# Patient Record
Sex: Male | Born: 1965 | Race: White | Hispanic: No | Marital: Single | Smoking: Never smoker
Health system: Southern US, Community
[De-identification: ages and names within clinical notes are randomized; demographics above are authoritative.]

## PROBLEM LIST (undated history)

## (undated) DIAGNOSIS — I10 Essential (primary) hypertension: Secondary | ICD-10-CM

## (undated) DIAGNOSIS — F32A Depression, unspecified: Secondary | ICD-10-CM

## (undated) DIAGNOSIS — J45990 Exercise induced bronchospasm: Secondary | ICD-10-CM

## (undated) DIAGNOSIS — Z9889 Other specified postprocedural states: Secondary | ICD-10-CM

## (undated) DIAGNOSIS — M51369 Other intervertebral disc degeneration, lumbar region without mention of lumbar back pain or lower extremity pain: Secondary | ICD-10-CM

## (undated) DIAGNOSIS — J189 Pneumonia, unspecified organism: Secondary | ICD-10-CM

## (undated) DIAGNOSIS — Z8709 Personal history of other diseases of the respiratory system: Secondary | ICD-10-CM

## (undated) DIAGNOSIS — F329 Major depressive disorder, single episode, unspecified: Secondary | ICD-10-CM

## (undated) DIAGNOSIS — K219 Gastro-esophageal reflux disease without esophagitis: Secondary | ICD-10-CM

## (undated) DIAGNOSIS — K589 Irritable bowel syndrome without diarrhea: Secondary | ICD-10-CM

## (undated) DIAGNOSIS — R112 Nausea with vomiting, unspecified: Secondary | ICD-10-CM

## (undated) DIAGNOSIS — H9312 Tinnitus, left ear: Secondary | ICD-10-CM

## (undated) DIAGNOSIS — Z9109 Other allergy status, other than to drugs and biological substances: Secondary | ICD-10-CM

## (undated) DIAGNOSIS — D649 Anemia, unspecified: Secondary | ICD-10-CM

## (undated) DIAGNOSIS — G4730 Sleep apnea, unspecified: Secondary | ICD-10-CM

## (undated) DIAGNOSIS — M5136 Other intervertebral disc degeneration, lumbar region: Secondary | ICD-10-CM

## (undated) DIAGNOSIS — K5190 Ulcerative colitis, unspecified, without complications: Secondary | ICD-10-CM

## (undated) HISTORY — PX: PYLOROPLASTY: SHX418

## (undated) HISTORY — PX: OTHER SURGICAL HISTORY: SHX169

## (undated) HISTORY — DX: Sleep apnea, unspecified: G47.30

## (undated) HISTORY — DX: Ulcerative colitis, unspecified, without complications: K51.90

## (undated) HISTORY — PX: BACK SURGERY: SHX140

## (undated) HISTORY — PX: SEPTOPLASTY: SUR1290

## (undated) HISTORY — PX: TONSILLECTOMY AND ADENOIDECTOMY: SUR1326

---

## 1998-03-31 ENCOUNTER — Encounter: Admission: RE | Admit: 1998-03-31 | Discharge: 1998-03-31 | Payer: Self-pay | Admitting: Family Medicine

## 1998-04-04 ENCOUNTER — Encounter: Admission: RE | Admit: 1998-04-04 | Discharge: 1998-04-04 | Payer: Self-pay | Admitting: Sports Medicine

## 1998-06-20 ENCOUNTER — Encounter: Admission: RE | Admit: 1998-06-20 | Discharge: 1998-06-20 | Payer: Self-pay | Admitting: Family Medicine

## 1999-12-12 ENCOUNTER — Encounter: Admission: RE | Admit: 1999-12-12 | Discharge: 1999-12-12 | Payer: Self-pay | Admitting: Family Medicine

## 2001-04-07 ENCOUNTER — Encounter: Admission: RE | Admit: 2001-04-07 | Discharge: 2001-04-07 | Payer: Self-pay | Admitting: Family Medicine

## 2001-04-21 ENCOUNTER — Encounter: Admission: RE | Admit: 2001-04-21 | Discharge: 2001-04-21 | Payer: Self-pay | Admitting: Family Medicine

## 2001-11-17 ENCOUNTER — Encounter: Admission: RE | Admit: 2001-11-17 | Discharge: 2001-11-17 | Payer: Self-pay | Admitting: Family Medicine

## 2002-03-08 ENCOUNTER — Encounter: Admission: RE | Admit: 2002-03-08 | Discharge: 2002-03-08 | Payer: Self-pay | Admitting: Family Medicine

## 2002-06-01 ENCOUNTER — Encounter: Admission: RE | Admit: 2002-06-01 | Discharge: 2002-06-01 | Payer: Self-pay | Admitting: Family Medicine

## 2002-06-22 ENCOUNTER — Encounter: Admission: RE | Admit: 2002-06-22 | Discharge: 2002-06-22 | Payer: Self-pay | Admitting: Family Medicine

## 2002-08-23 ENCOUNTER — Encounter (INDEPENDENT_AMBULATORY_CARE_PROVIDER_SITE_OTHER): Payer: Self-pay

## 2002-08-23 ENCOUNTER — Ambulatory Visit (HOSPITAL_COMMUNITY): Admission: RE | Admit: 2002-08-23 | Discharge: 2002-08-23 | Payer: Self-pay | Admitting: Gastroenterology

## 2003-03-08 ENCOUNTER — Encounter: Admission: RE | Admit: 2003-03-08 | Discharge: 2003-03-08 | Payer: Self-pay | Admitting: Family Medicine

## 2003-03-08 ENCOUNTER — Encounter: Payer: Self-pay | Admitting: Internal Medicine

## 2003-03-08 ENCOUNTER — Ambulatory Visit (HOSPITAL_COMMUNITY): Admission: RE | Admit: 2003-03-08 | Discharge: 2003-03-08 | Payer: Self-pay | Admitting: Internal Medicine

## 2003-04-01 ENCOUNTER — Ambulatory Visit (HOSPITAL_COMMUNITY): Admission: RE | Admit: 2003-04-01 | Discharge: 2003-04-01 | Payer: Self-pay | Admitting: Sports Medicine

## 2003-06-06 ENCOUNTER — Encounter: Admission: RE | Admit: 2003-06-06 | Discharge: 2003-06-06 | Payer: Self-pay | Admitting: Family Medicine

## 2004-01-04 ENCOUNTER — Encounter: Admission: RE | Admit: 2004-01-04 | Discharge: 2004-01-04 | Payer: Self-pay | Admitting: Family Medicine

## 2004-10-30 ENCOUNTER — Ambulatory Visit: Payer: Self-pay | Admitting: Family Medicine

## 2005-03-08 ENCOUNTER — Ambulatory Visit: Payer: Self-pay | Admitting: Sports Medicine

## 2005-04-05 ENCOUNTER — Ambulatory Visit: Payer: Self-pay | Admitting: Sports Medicine

## 2005-04-11 ENCOUNTER — Ambulatory Visit (HOSPITAL_COMMUNITY): Admission: RE | Admit: 2005-04-11 | Discharge: 2005-04-11 | Payer: Self-pay | Admitting: Sports Medicine

## 2005-04-16 ENCOUNTER — Encounter: Admission: RE | Admit: 2005-04-16 | Discharge: 2005-07-15 | Payer: Self-pay | Admitting: Sports Medicine

## 2005-08-12 HISTORY — PX: COLONOSCOPY: SHX5424

## 2006-05-09 ENCOUNTER — Ambulatory Visit: Payer: Self-pay | Admitting: Family Medicine

## 2006-05-20 ENCOUNTER — Ambulatory Visit: Payer: Self-pay | Admitting: Family Medicine

## 2006-10-09 DIAGNOSIS — J309 Allergic rhinitis, unspecified: Secondary | ICD-10-CM | POA: Insufficient documentation

## 2006-10-09 DIAGNOSIS — K589 Irritable bowel syndrome without diarrhea: Secondary | ICD-10-CM | POA: Insufficient documentation

## 2006-10-09 DIAGNOSIS — E739 Lactose intolerance, unspecified: Secondary | ICD-10-CM | POA: Insufficient documentation

## 2006-10-09 DIAGNOSIS — F329 Major depressive disorder, single episode, unspecified: Secondary | ICD-10-CM

## 2006-10-09 DIAGNOSIS — K219 Gastro-esophageal reflux disease without esophagitis: Secondary | ICD-10-CM | POA: Insufficient documentation

## 2006-10-09 DIAGNOSIS — F32A Depression, unspecified: Secondary | ICD-10-CM | POA: Insufficient documentation

## 2006-10-09 DIAGNOSIS — E669 Obesity, unspecified: Secondary | ICD-10-CM | POA: Insufficient documentation

## 2006-10-09 DIAGNOSIS — J45909 Unspecified asthma, uncomplicated: Secondary | ICD-10-CM | POA: Insufficient documentation

## 2006-10-12 ENCOUNTER — Emergency Department (HOSPITAL_COMMUNITY): Admission: EM | Admit: 2006-10-12 | Discharge: 2006-10-12 | Payer: Self-pay | Admitting: Family Medicine

## 2006-12-05 ENCOUNTER — Telehealth: Payer: Self-pay | Admitting: Family Medicine

## 2007-02-11 ENCOUNTER — Telehealth: Payer: Self-pay | Admitting: *Deleted

## 2007-02-16 ENCOUNTER — Telehealth: Payer: Self-pay | Admitting: *Deleted

## 2007-02-18 ENCOUNTER — Encounter: Payer: Self-pay | Admitting: Family Medicine

## 2007-02-18 ENCOUNTER — Ambulatory Visit: Payer: Self-pay | Admitting: Family Medicine

## 2007-02-18 LAB — CONVERTED CEMR LAB
Albumin: 4.9 g/dL (ref 3.5–5.2)
Alkaline Phosphatase: 86 units/L (ref 39–117)
Glucose, Bld: 81 mg/dL (ref 70–99)
HCT: 43.6 % (ref 39.0–52.0)
Hemoglobin: 15 g/dL (ref 13.0–17.0)
MCHC: 34.4 g/dL (ref 30.0–36.0)
Platelets: 201 10*3/uL (ref 150–400)
Potassium: 3.9 meq/L (ref 3.5–5.3)
RBC: 5.09 M/uL (ref 4.22–5.81)
RDW: 12.6 % (ref 11.5–14.0)
Sodium: 141 meq/L (ref 135–145)

## 2007-03-17 ENCOUNTER — Ambulatory Visit: Payer: Self-pay | Admitting: Family Medicine

## 2007-03-17 DIAGNOSIS — G4730 Sleep apnea, unspecified: Secondary | ICD-10-CM | POA: Insufficient documentation

## 2007-04-13 DIAGNOSIS — G4730 Sleep apnea, unspecified: Secondary | ICD-10-CM

## 2007-04-13 HISTORY — PX: OTHER SURGICAL HISTORY: SHX169

## 2007-04-13 HISTORY — DX: Sleep apnea, unspecified: G47.30

## 2007-04-15 ENCOUNTER — Encounter: Payer: Self-pay | Admitting: Family Medicine

## 2007-04-15 ENCOUNTER — Ambulatory Visit (HOSPITAL_BASED_OUTPATIENT_CLINIC_OR_DEPARTMENT_OTHER): Admission: RE | Admit: 2007-04-15 | Discharge: 2007-04-15 | Payer: Self-pay | Admitting: Family Medicine

## 2007-04-18 ENCOUNTER — Ambulatory Visit: Payer: Self-pay | Admitting: Internal Medicine

## 2007-05-07 ENCOUNTER — Ambulatory Visit: Payer: Self-pay | Admitting: Sports Medicine

## 2007-05-07 ENCOUNTER — Telehealth (INDEPENDENT_AMBULATORY_CARE_PROVIDER_SITE_OTHER): Payer: Self-pay | Admitting: *Deleted

## 2007-05-20 ENCOUNTER — Encounter: Payer: Self-pay | Admitting: Family Medicine

## 2007-06-02 ENCOUNTER — Ambulatory Visit: Payer: Self-pay | Admitting: Family Medicine

## 2007-06-18 ENCOUNTER — Telehealth: Payer: Self-pay | Admitting: *Deleted

## 2007-06-23 ENCOUNTER — Encounter: Payer: Self-pay | Admitting: *Deleted

## 2007-07-24 ENCOUNTER — Encounter: Payer: Self-pay | Admitting: Family Medicine

## 2007-07-24 ENCOUNTER — Ambulatory Visit (HOSPITAL_BASED_OUTPATIENT_CLINIC_OR_DEPARTMENT_OTHER): Admission: RE | Admit: 2007-07-24 | Discharge: 2007-07-24 | Payer: Self-pay | Admitting: Family Medicine

## 2007-08-02 ENCOUNTER — Ambulatory Visit: Payer: Self-pay | Admitting: Internal Medicine

## 2007-09-02 ENCOUNTER — Telehealth: Payer: Self-pay | Admitting: *Deleted

## 2007-09-29 ENCOUNTER — Ambulatory Visit: Payer: Self-pay | Admitting: Family Medicine

## 2007-09-29 DIAGNOSIS — G2581 Restless legs syndrome: Secondary | ICD-10-CM | POA: Insufficient documentation

## 2007-10-11 HISTORY — PX: ESOPHAGOGASTRODUODENOSCOPY: SHX5428

## 2007-10-26 ENCOUNTER — Telehealth: Payer: Self-pay | Admitting: *Deleted

## 2007-10-27 ENCOUNTER — Telehealth: Payer: Self-pay | Admitting: Family Medicine

## 2007-10-28 ENCOUNTER — Encounter: Payer: Self-pay | Admitting: Family Medicine

## 2007-11-03 ENCOUNTER — Telehealth: Payer: Self-pay | Admitting: Family Medicine

## 2007-11-04 ENCOUNTER — Encounter: Payer: Self-pay | Admitting: Family Medicine

## 2007-11-04 ENCOUNTER — Encounter (INDEPENDENT_AMBULATORY_CARE_PROVIDER_SITE_OTHER): Payer: Self-pay | Admitting: Gastroenterology

## 2007-11-04 ENCOUNTER — Ambulatory Visit (HOSPITAL_COMMUNITY): Admission: RE | Admit: 2007-11-04 | Discharge: 2007-11-04 | Payer: Self-pay | Admitting: Gastroenterology

## 2007-11-05 ENCOUNTER — Encounter: Payer: Self-pay | Admitting: *Deleted

## 2007-11-06 ENCOUNTER — Telehealth (INDEPENDENT_AMBULATORY_CARE_PROVIDER_SITE_OTHER): Payer: Self-pay | Admitting: *Deleted

## 2007-11-09 ENCOUNTER — Telehealth (INDEPENDENT_AMBULATORY_CARE_PROVIDER_SITE_OTHER): Payer: Self-pay | Admitting: *Deleted

## 2007-11-09 ENCOUNTER — Telehealth (INDEPENDENT_AMBULATORY_CARE_PROVIDER_SITE_OTHER): Payer: Self-pay | Admitting: Family Medicine

## 2007-11-10 ENCOUNTER — Encounter: Payer: Self-pay | Admitting: *Deleted

## 2007-11-10 ENCOUNTER — Telehealth: Payer: Self-pay | Admitting: *Deleted

## 2007-12-22 ENCOUNTER — Ambulatory Visit: Payer: Self-pay | Admitting: Family Medicine

## 2007-12-22 ENCOUNTER — Encounter: Payer: Self-pay | Admitting: Family Medicine

## 2007-12-22 DIAGNOSIS — R300 Dysuria: Secondary | ICD-10-CM | POA: Insufficient documentation

## 2007-12-22 DIAGNOSIS — M533 Sacrococcygeal disorders, not elsewhere classified: Secondary | ICD-10-CM | POA: Insufficient documentation

## 2007-12-22 DIAGNOSIS — L720 Epidermal cyst: Secondary | ICD-10-CM | POA: Insufficient documentation

## 2007-12-22 LAB — CONVERTED CEMR LAB
Bilirubin Urine: NEGATIVE
Blood in Urine, dipstick: NEGATIVE
Glucose, Urine, Semiquant: NEGATIVE
Ketones, urine, test strip: NEGATIVE
PSA: 0.48 ng/mL (ref 0.10–4.00)
Urobilinogen, UA: 0.2

## 2007-12-28 ENCOUNTER — Encounter: Payer: Self-pay | Admitting: Family Medicine

## 2007-12-28 ENCOUNTER — Ambulatory Visit: Payer: Self-pay | Admitting: Sports Medicine

## 2007-12-28 ENCOUNTER — Telehealth: Payer: Self-pay | Admitting: *Deleted

## 2007-12-28 LAB — CONVERTED CEMR LAB

## 2008-07-26 ENCOUNTER — Ambulatory Visit: Payer: Self-pay | Admitting: Family Medicine

## 2008-07-26 ENCOUNTER — Telehealth: Payer: Self-pay | Admitting: *Deleted

## 2008-07-26 DIAGNOSIS — S335XXA Sprain of ligaments of lumbar spine, initial encounter: Secondary | ICD-10-CM | POA: Insufficient documentation

## 2008-07-26 DIAGNOSIS — I10 Essential (primary) hypertension: Secondary | ICD-10-CM | POA: Insufficient documentation

## 2008-12-15 ENCOUNTER — Telehealth: Payer: Self-pay | Admitting: Family Medicine

## 2009-03-08 ENCOUNTER — Telehealth: Payer: Self-pay | Admitting: Family Medicine

## 2009-05-16 ENCOUNTER — Ambulatory Visit: Payer: Self-pay | Admitting: Family Medicine

## 2009-05-16 DIAGNOSIS — Z8669 Personal history of other diseases of the nervous system and sense organs: Secondary | ICD-10-CM | POA: Insufficient documentation

## 2009-05-16 DIAGNOSIS — H9190 Unspecified hearing loss, unspecified ear: Secondary | ICD-10-CM | POA: Insufficient documentation

## 2009-05-19 ENCOUNTER — Telehealth: Payer: Self-pay | Admitting: *Deleted

## 2009-05-21 ENCOUNTER — Encounter: Payer: Self-pay | Admitting: Family Medicine

## 2009-05-23 ENCOUNTER — Encounter: Payer: Self-pay | Admitting: Family Medicine

## 2009-06-01 ENCOUNTER — Encounter: Payer: Self-pay | Admitting: Family Medicine

## 2009-06-07 ENCOUNTER — Encounter: Payer: Self-pay | Admitting: Family Medicine

## 2009-06-23 ENCOUNTER — Encounter: Payer: Self-pay | Admitting: Family Medicine

## 2009-07-12 ENCOUNTER — Ambulatory Visit (HOSPITAL_COMMUNITY): Admission: RE | Admit: 2009-07-12 | Discharge: 2009-07-13 | Payer: Self-pay | Admitting: Otolaryngology

## 2009-07-17 ENCOUNTER — Encounter: Payer: Self-pay | Admitting: Family Medicine

## 2009-08-23 ENCOUNTER — Encounter: Payer: Self-pay | Admitting: Family Medicine

## 2009-12-26 ENCOUNTER — Ambulatory Visit: Payer: Self-pay | Admitting: Family Medicine

## 2009-12-26 DIAGNOSIS — J0191 Acute recurrent sinusitis, unspecified: Secondary | ICD-10-CM

## 2009-12-26 DIAGNOSIS — J019 Acute sinusitis, unspecified: Secondary | ICD-10-CM | POA: Insufficient documentation

## 2010-01-08 ENCOUNTER — Emergency Department (HOSPITAL_COMMUNITY): Admission: EM | Admit: 2010-01-08 | Discharge: 2010-01-08 | Payer: Self-pay | Admitting: Family Medicine

## 2010-01-18 ENCOUNTER — Telehealth: Payer: Self-pay | Admitting: Family Medicine

## 2010-01-18 ENCOUNTER — Ambulatory Visit: Payer: Self-pay | Admitting: Family Medicine

## 2010-01-18 DIAGNOSIS — J029 Acute pharyngitis, unspecified: Secondary | ICD-10-CM | POA: Insufficient documentation

## 2010-01-18 DIAGNOSIS — R634 Abnormal weight loss: Secondary | ICD-10-CM | POA: Insufficient documentation

## 2010-01-22 LAB — CONVERTED CEMR LAB
Basophils Relative: 0 % (ref 0–1)
CO2: 26 meq/L (ref 19–32)
Creatinine, Ser: 0.83 mg/dL (ref 0.40–1.50)
EBV VCA IgG: 0.09
EBV VCA IgM: 0.22
Eosinophils Absolute: 0.2 10*3/uL (ref 0.0–0.7)
Eosinophils Relative: 3 % (ref 0–5)
HCT: 40.4 % (ref 39.0–52.0)
Lymphocytes Relative: 27 % (ref 12–46)
Lymphs Abs: 2 10*3/uL (ref 0.7–4.0)
MCHC: 34.4 g/dL (ref 30.0–36.0)
MCV: 83.3 fL (ref 78.0–100.0)
Monocytes Absolute: 0.4 10*3/uL (ref 0.1–1.0)
Monocytes Relative: 6 % (ref 3–12)
Platelets: 240 10*3/uL (ref 150–400)
RDW: 12.9 % (ref 11.5–15.5)
Sodium: 139 meq/L (ref 135–145)
Toxoplasma Antibody- IgM: 0.3
WBC: 7.3 10*3/uL (ref 4.0–10.5)

## 2010-06-03 ENCOUNTER — Encounter: Payer: Self-pay | Admitting: Family Medicine

## 2010-09-11 NOTE — Miscellaneous (Signed)
Summary: HCTZ increase  Rocky Link reports blood pressure running above goal. Would like to try higher dose. Will increase HCTZ and he will schedule a follow-up in Feb

## 2010-09-11 NOTE — Assessment & Plan Note (Signed)
Summary: sore throat/[place]/[name]   Vital Signs:  Patient profile:   45 year old male Weight:      240.5 pounds Temp:     98.2 degrees F oral Pulse rate:   84 / minute BP sitting:   130 / 80  (right arm)  Vitals Entered By: Arlyss Repress CMA, (January 18, 2010 4:32 PM) CC: ST x 2 1/2 weeks. right ear irritated. was treated with z-pack, prednisone and albuteral at UC 2 weeks ago. Is Patient Diabetic? No Pain Assessment Patient in pain? no        Primary Care Provider:  Zachery Dauer MD  CC:  ST x 2 1/2 weeks. right ear irritated. was treated with z-pack and prednisone and albuteral at UC 2 weeks ago.Patrick Oconnor  History of Present Illness: Patrick Oconnor to [facility] Monday May 30th for recurrence of cough and sore throat, where he was treated with a Z pack, Albuterol MDI , and prednisone though neg chest x-ray and no wheezes recorded. His sore throat was better with the 5 days of prednisone, but now has recurred. He's lost 10 lb which he blames on anorexia rather than sore throat. He sweats during the day time, but no recent fever or chills. He had temperature of 100.9 for 2 days 3 weeks ago. Hasn't felt well for over 30 days. He feels very fatigued.  Monospot was neg at urgent care.   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   Habits & Providers  Alcohol-Tobacco-Diet     Tobacco Status: never  Allergies: No Known Drug Allergies  Physical Exam  General:  alert and overweight, and well appearing.   Ears:  External ear exam shows no significant lesions or deformities.  Otoscopic examination reveals clear canals, tympanic membranes are intact bilaterally without bulging, retraction, inflammation or discharge. Hearing is grossly normal bilaterally. Nose:  External nasal examination shows no deformity or inflammation. Nasal mucosa are pink and moist without lesions or exudates. Mouth:  Tonsils reddened with white material in the crypts, but no exudate.  Lungs:  Normal respiratory effort, chest expands  symmetrically. Lungs are clear to auscultation, no crackles or wheezes. Heart:  Normal rate and regular rhythm. S1 and S2 normal without gallop, murmur, click, rub or other extra sounds. Abdomen:  Bowel sounds positive,abdomen soft and non-tender without masses, organomegaly or hernias noted. Skin:  Intact without suspicious lesions or rashes except one superficial pustule right lateral neck Cervical Nodes:  No lymphadenopathy noted Axillary Nodes:  No palpable lymphadenopathy Inguinal Nodes:  No significant adenopathy Psych:  normally interactive, good eye contact, and not depressed appearing.     Impression & Recommendations:  Problem # 1:  PHARYNGITIS, ACUTE, RECURRENT (ICD-462) Recurrent status post amoxicillin and Z-pack. history of neg monospot. Consider mono like illness, though no adenopathy.  The following medications were removed from the medication list:    Amoxicillin 500 Mg Caps (Amoxicillin) .Patrick Oconnor... Take one cap three times a day  Orders: Miscellaneous Lab Charge-[facility] (16109) [facility]- Est Level  3 (99213) Rapid Strep-[facility] (60454) CRP, high sensitivity-[facility] (09811-91478) CBC w/Diff-[facility] (29562)  Problem # 2:  WEIGHT LOSS (ICD-783.21) Concerning for chronic infection or other systemic illness.  Orders: [facility]- Est Level  3 (99213) CRP, high sensitivity-[facility] (13086-57846) HIV-[facility] (96295-28413) Comp Met-[facility] (24401-02725)  Complete Medication List: 1)  Albuterol 90 Mcg/act Aers (Albuterol) .... Inhale 2 puff using inhaler four times a day 2)  Citalopram Hydrobromide 10 Mg Tabs (Citalopram hydrobromide) .... Take 1 tablet by mouth  once a day 3)  Claritin 10 Mg Tabs (Loratadine) .Patrick Oconnor.. 1 tablet by mouth once a day 4)  Flonase 50 Mcg/act Susp (Fluticasone propionate) .... Spray 2 spray into both nostrils once a day prn 5)  Prilosec 20 Mg Cpdr (Omeprazole) .... Take 1 capsule by mouth once a day 6)  Tramadol Hcl 50 Mg Tabs (Tramadol hcl) .... Take one-two three times a day as needed for  leg pain 7)  Hydrochlorothiazide 25 Mg Tabs (Hydrochlorothiazide) .... Take one tablet daily  Patient Instructions: 1)  I will call with lab results  2)  Take Ibuprofen and topical treatments for the sore throal.   Laboratory Results  Date/Time Received: January 18, 2010 5:00 PM  Date/Time Reported: January 18, 2010 5:13 PM   Other Tests  Rapid Strep: negative Comments: ...............test performed by......Patrick KitchenBonnie A. [place], MLS (ASCP)cm

## 2010-09-11 NOTE — Progress Notes (Signed)
Summary: triage  Phone Note Call from Patient Call back at [phone] Beeper #   Caller: Patient Summary of Call: Pt has sore throat wondering if he can be seen today. Initial call taken by: Clydell Hakim,  January 18, 2010 9:28 AM  Follow-up for Phone Call        I paged her Follow-up by: Golden Circle RN,  January 18, 2010 9:52 AM  Additional Follow-up for Phone Call Additional follow up Details #1::        started with sinus infection & sore throat 1 week after that. UC gave him Z pac & prednisione. now the sore throat is back & R ear hurts. work in at 3. took [employer] this am. aware of wait Additional Follow-up by: Golden Circle RN,  January 18, 2010 10:07 AM

## 2010-09-11 NOTE — Consult Note (Signed)
Summary: [facility]  [facility]   Imported By: Denny Peon LEVAN 09/30/2007 10:51:15  _____________________________________________________________________  External Attachment:    Type:   Image     Comment:   External Document

## 2010-09-11 NOTE — Assessment & Plan Note (Signed)
Summary: sinus inf/Patrick Oconnor,df   Vital Signs:  Patient profile:   45 year old male Height:      64 inches Weight:      250.2 pounds BMI:     43.10 Temp:     98.4 degrees F Pulse rate:   96 / minute BP sitting:   145 / 84  Vitals Entered By: Golden Circle RN (Dec 26, 2009 1:36 PM)  Primary Care Provider:  Zachery Dauer MD   History of Present Illness: Had usual allergic rhinitis symptoms then developed posterior nasal drip brown and bloody. Hoarseness. Better 2 days ago then cough became productive and throat sore again. No facial pain, fever, night sweats. No chest pain but tightness. bowel movement became orange.   Only uses the Albuterol for intermittent exercise induced wheezing.   Prilosec is controlling GERD  blood pressures checked at work and are in the 140's. Not sure if he ever got the 25 mg HCTZ dose.   occasional furosemide from mother for bloating and swelling in axillae and groins, though no ankle swelling.   Plans to schedule a CPE in a couple months.   Habits & Providers  Alcohol-Tobacco-Diet     Alcohol drinks/day: 0     Tobacco Status: never  Exercise-Depression-Behavior     Does Patient Exercise: yes     Type of exercise: walk     Times/week: <3     Seat Belt Use: always     Sun Exposure: infrequent  Current Medications (verified): 1)  Albuterol 90 Mcg/act Aers (Albuterol) .... Inhale 2 Puff Using Inhaler Four Times A Day 2)  Citalopram Hydrobromide 10 Mg Tabs (Citalopram Hydrobromide) .... Take 1 Tablet By Mouth Once A Day 3)  Claritin 10 Mg Tabs (Loratadine) .Marland Kitchen.. 1 Tablet By Mouth Once A Day 4)  Flonase 50 Mcg/act Susp (Fluticasone Propionate) .... Spray 2 Spray Into Both Nostrils Once A Day Prn 5)  Prilosec 20 Mg Cpdr (Omeprazole) .... Take 1 Capsule By Mouth Once A Day 6)  Tramadol Hcl 50 Mg  Tabs (Tramadol Hcl) .... Take One-Two Three Times A Day As Needed For Leg Pain 7)  Hydrochlorothiazide 25 Mg Tabs (Hydrochlorothiazide) .... Take One Tablet  Daily 8)  Amoxicillin 500 Mg Caps (Amoxicillin) .... Take One Cap Three Times A Day  Allergies (verified): No Known Drug Allergies  Social History: Does Patient Exercise:  yes Seat Belt Use:  always Sun Exposure-Excessive:  infrequent  Physical Exam  General:  alert and overweight-appearing.   Ears:  External ear exam shows no significant lesions or deformities.  Otoscopic examination reveals clear canals, tympanic membranes are intact bilaterally without bulging, retraction, inflammation or discharge. Hearing is grossly normal bilaterally. Nose:  External nasal examination shows no deformity or inflammation. Nasal mucosa are pink and moist without lesions or exudates. Mouth:  Oral mucosa and oropharynx without lesions or exudates.  Teeth in good repair. Neck:  No deformities, masses, or tenderness noted. Lungs:  Normal respiratory effort, chest expands symmetrically. Lungs are clear to auscultation, no crackles or wheezes. Loose cough Heart:  Normal rate and regular rhythm. S1 and S2 normal without gallop, murmur, click, rub or other extra sounds. Extremities:  trace left pedal edema and trace right pedal edema.     Impression & Recommendations:  Problem # 1:  OTHER ACUTE SINUSITIS (ICD-461.8)  Will treat for sinusitis/bronchitis due to second sickening, though likely all viral etiology plus his seasonal allergies. His updated medication list for this problem includes:  Flonase 50 Mcg/act Susp (Fluticasone propionate) ..... Spray 2 spray into both nostrils once a day prn    Amoxicillin 500 Mg Caps (Amoxicillin) .Marland Kitchen... Take one cap three times a day  Orders: [facility]- Est Level  3 (16109)  Problem # 2:  HYPERTENSION, MILD (ICD-401.1)  He will call with HCTZ dose. If on 25 mg will add ACE and check creatinine after.  His updated medication list for this problem includes:    Hydrochlorothiazide 25 Mg Tabs (Hydrochlorothiazide) .Marland Kitchen... Take one tablet daily  Orders: [facility]- Est Level  3  (60454)  Problem # 3:  RHINITIS, ALLERGIC (ICD-477.9)  His updated medication list for this problem includes:    Claritin 10 Mg Tabs (Loratadine) .Marland Kitchen... 1 tablet by mouth once a day    Flonase 50 Mcg/act Susp (Fluticasone propionate) ..... Spray 2 spray into both nostrils once a day prn  Orders: [facility]- Est Level  3 (09811)  Complete Medication List: 1)  Albuterol 90 Mcg/act Aers (Albuterol) .... Inhale 2 puff using inhaler four times a day 2)  Citalopram Hydrobromide 10 Mg Tabs (Citalopram hydrobromide) .... Take 1 tablet by mouth once a day 3)  Claritin 10 Mg Tabs (Loratadine) .Marland Kitchen.. 1 tablet by mouth once a day 4)  Flonase 50 Mcg/act Susp (Fluticasone propionate) .... Spray 2 spray into both nostrils once a day prn 5)  Prilosec 20 Mg Cpdr (Omeprazole) .... Take 1 capsule by mouth once a day 6)  Tramadol Hcl 50 Mg Tabs (Tramadol hcl) .... Take one-two three times a day as needed for leg pain 7)  Hydrochlorothiazide 25 Mg Tabs (Hydrochlorothiazide) .... Take one tablet daily 8)  Amoxicillin 500 Mg Caps (Amoxicillin) .... Take one cap three times a day  Patient Instructions: 1)  Please schedule a follow-up appointment as needed .  2)  Call me if you are taking the 25 mg HCTZ and I'll add another blood pressure medication.    Prescriptions: AMOXICILLIN 500 MG CAPS (AMOXICILLIN) Take one cap three times a day  #21 x 0   Entered and Authorized by:   Zachery Dauer MD   Signed by:   Zachery Dauer MD on 12/26/2009   Method used:   Electronically to        [facility]* (retail)       [address]       [address] [place] [employer]       [place], [place]  [address]       Ph: [phone]       Fax: [phone]   RxID:   [id number]     Prevention & Chronic Care Immunizations   Influenza vaccine: Not documented    Tetanus booster: 11/10/2001: Done.   Tetanus booster due: 11/11/2011    Pneumococcal vaccine: Done.  (06/12/2002)   Pneumococcal vaccine due:  None  Other Screening   Smoking status: never  (12/26/2009)  Lipids   Total Cholesterol: Not documented   LDL: Not documented   LDL Direct: Not documented   HDL: Not documented   Triglycerides: Not documented  Hypertension   Last Blood Pressure: 145 / 84  (12/26/2009)   Serum creatinine: 1.04  (02/18/2007)   Serum potassium 3.9  (02/18/2007)    Hypertension flowsheet reviewed?: Yes   Progress toward BP goal: Unchanged  Self-Management Support :   Personal Goals (by the next clinic visit) :      Personal blood pressure goal: 140/90  (12/26/2009)   Hypertension self-management support: Not documented

## 2010-09-11 NOTE — Consult Note (Signed)
Summary: Allergy consultation  [facility]   Imported By: Clydell Hakim 10/05/2009 15:17:19  _____________________________________________________________________  External Attachment:    Type:   Image     Comment:   External Document

## 2010-09-11 NOTE — Miscellaneous (Signed)
Summary: asthma classification   Clinical Lists Changes  Problems: Changed problem from ASTHMA, EXERCISE INDUCED (ICD-493.81) to ASTHMA, INTERMITTENT (ICD-493.90) 

## 2010-09-27 ENCOUNTER — Other Ambulatory Visit: Payer: Self-pay | Admitting: Family Medicine

## 2010-09-27 NOTE — Telephone Encounter (Signed)
Refill request

## 2010-10-29 LAB — POCT INFECTIOUS MONO SCREEN: Mono Screen: NEGATIVE

## 2010-10-29 LAB — POCT I-STAT, CHEM 8
Chloride: 103 mEq/L (ref 96–112)
Creatinine, Ser: 1 mg/dL (ref 0.4–1.5)
HCT: 43 % (ref 39.0–52.0)
Potassium: 4.2 mEq/L (ref 3.5–5.1)
TCO2: 32 mmol/L (ref 0–100)

## 2010-11-14 LAB — BASIC METABOLIC PANEL
CO2: 30 mEq/L (ref 19–32)
Calcium: 9.4 mg/dL (ref 8.4–10.5)
Creatinine, Ser: 0.86 mg/dL (ref 0.4–1.5)
GFR calc Af Amer: >60 mL/min (ref >60)
Glucose, Bld: 105 mg/dL — ABNORMAL HIGH (ref 70–99)

## 2010-11-14 LAB — CBC
Hemoglobin: 15.2 g/dL (ref 13.0–17.0)
MCV: 87.8 fL (ref 78.0–100.0)
RBC: 5.01 MIL/uL (ref 4.22–5.81)
RDW: 12.7 % (ref 11.5–15.5)
WBC: 6.2 10*3/uL (ref 4.0–10.5)

## 2010-12-25 NOTE — Op Note (Signed)
NAME:  Patrick Oconnor, Patrick Oconnor           ACCOUNT NO.:  [id number]   MEDICAL RECORD NO.:  [id number]          PATIENT TYPE:  AMB   LOCATION:  ENDO                         FACILITY:  [facility]   PHYSICIAN:  James L. Malon Kindle., M.D.DATE OF BIRTH:  23-Jul-1966   DATE OF PROCEDURE:  11/04/2007  DATE OF DISCHARGE:                               OPERATIVE REPORT   PROCEDURE:  Colonoscopy with biopsy.   ENDOSCOPIST:  Dr. Fayrene Fearing L. Edwards.   MEDICATIONS:  The patient received a total of fentanyl 75 mcg, Versed 8  mg for both procedures.   INDICATIONS FOR PROCEDURE:  Rectal bleeding with a very strong family  history of colon cancer and polyps.  Father had polyps and the  grandmother had colon cancer.  He has had loose stools and a lot of gas,  a multitude of cramps and diarrhea, etc.   DESCRIPTION OF PROCEDURE:  The procedure had been explained to the  patient and a consent obtained.  After the above was completed, the  patient was turned around and the Pentax pediatric scope was inserted  and advanced. The cecum was easily reached.  The ileocecal valve and  appendiceal orifice were seen.  The scope was withdrawn.  The mucosa was  completely normal throughout.  There were no polyps, diverticular  disease, tumors, etc.  Multiple random biopsies were taken to look for  microscopic abnormalities.  The scope was withdrawn down to the rectum.  There were internal hemorrhoids seen in the rectum.  The scope was  withdrawn.   The patient tolerated the procedure well.   ASSESSMENT:  1. Diarrhea with normal colonoscopy, grossly normal mucosa.  2. Family history of colonic neoplasia.   PLAN:  1. Will check pathology.  2. Medications.  3. Will see in the office in about six weeks.  4. Consider a colonoscopy in approximately five years, due to his      family history of neoplasia.           ______________________________  Llana Aliment. Malon Kindle., M.D.     Waldron Session  D:  11/04/2007  T:  11/04/2007   Job:  [id number]   cc:   Deniece Portela A. Sheffield Slider, M.D.

## 2010-12-25 NOTE — Procedures (Signed)
NAME:  Patrick Oconnor, Patrick Oconnor NO.:  [id number]   MEDICAL RECORD NO.:  [id number]          PATIENT TYPE:  OUT   LOCATION:  SLEEP CENTER                 FACILITY:  [facility]   PHYSICIAN:  Clinton D. Maple Hudson, MD, FCCP, FACPDATE OF BIRTH:  04-Oct-1963   DATE OF STUDY:  04/15/2007                            NOCTURNAL POLYSOMNOGRAM   REFERRING PHYSICIAN:   INDICATIONS FOR STUDY:  Insomnia with sleep apnea.   Epworth Sleepiness Score 4/24.   BMI 32.7, weight 247 pounds.   Home medications listed and reviewed.   SLEEP ARCHITECTURE:  Short total sleep time 174 minutes with sleep  efficiency 49%.  Stage I was 6%, stage 2 35%, stage 3 absent, REM 8% of  total sleep time. Sleep latency 68 minutes. REM latency 241 minutes.  Awake after sleep onset 102 minutes.  Arousal index 27.2.  No bedtime  medication was taken.   Lights out at 11:12 p.m.  Sustained sleep onset only at 2:00 a.m.  Lights on at 5:12 a.m.  He indicated the quality of sleep was worse than  usual but did not give a specific reason why sleep quality was worse.   RESPIRATORY DATA:  Apnea/hypopnea index (AHI, RDI) 28.3 obstructive  events per hour, indicating moderate obstructive sleep apnea/hypopnea  syndrome during the time of sleep. This included a time of 4 obstructive  apneas and 78 hypopneas.  Most sleep and events were while supine.  REM  AHI 26.9.  Sleep onset was too late to permit C-PAP titration by a  protocol on this study night.   OXYGEN DATA:  Moderate snoring with oxygen desaturation nadir 85%.  Mean  oxygen saturation through the study was 94% on room air.   CARDIAC DATA:  Normal sinus rhythm.   MOVEMENT/PARASOMNIA:  Occasional limb jerk with arousal, probably  insignificant.   IMPRESSION/RECOMMENDATIONS:  1. Short total sleep time with delayed sleep onset, unsustained until      2:00 a.m.  No reason offered. No bedtime medication taken.  2. Moderate obstructive sleep apnea/hypopnea syndrome, AHI  28.3 per      hour with most events and most sleep while supine.  Moderate      snoring with oxygen desaturation nadir 85%.  3. Consider return for C-PAP titration or evaluate for alternative      therapy as appropriate. If C-PAP titration is chosen suggest he      bring a sedative/hypnotic if clinically appropriate.      Clinton D. Maple Hudson, MD, [facility], FACP  Diplomate, Biomedical engineer of Sleep Medicine  Electronically Signed     CDY/MEDQ  D:  04/18/2007 17:45:30  T:  04/18/2007 20:16:45  Job:  [id number]

## 2010-12-25 NOTE — Op Note (Signed)
NAME:  Patrick Oconnor, Patrick Oconnor           ACCOUNT NO.:  [id number]   MEDICAL RECORD NO.:  [id number]          PATIENT TYPE:  AMB   LOCATION:  ENDO                         [facility]ACILITY:  [facility]   PHYSICIAN:  James L. Malon Kindle., M.D.DATE OF BIRTH:  1966-01-04   DATE OF PROCEDURE:  11/04/2007  DATE OF DISCHARGE:                               OPERATIVE REPORT   PROCEDURE:  Esophagogastroduodenoscopy with biopsy.   ENDOSCOPIST:  Llana Aliment. Randa Evens, M.D.   MEDICATIONS:  Cetacaine spray, fentanyl 50 mcg, Versed 6 mg IV.   INDICATIONS:  Patient with persistent gas, chronic reflux, coughing,  loose stools, etc.   DESCRIPTION OF PROCEDURE:  The procedure was explained to the patient  and consent obtained.  With the patient in left lateral decubitus  position, upper endoscopy was performed.  The scope was inserted blindly  into the esophagus and advanced under direct visualization.  The stomach  was entered, pylorus identified and passed.  The duodenum including the  bulb and second portion were seen well.  Biopsies were taken of the  duodenum in the second and third portion to rule out celiac disease.  The duodenal bulb was normal, no inflammation or ulceration.  The  antrum, body, fundus and cardia of the stomach were all seen well and  were normal.  There was a hiatal hernia with a widely patent GE  junction.  The distal and proximal esophagus were endoscopically normal.  The scope was withdrawn.  The patient tolerated the procedure well.   ASSESSMENT:  Esophageal reflux disease with no significant findings on  upper endoscopy, biopsies taken to rule out celiac disease.   PLAN:  We will see back in the office in 6 weeks and continue on the  same medications for now.           ______________________________  Llana Aliment. Malon Kindle., M.D.     Waldron Session  D:  11/04/2007  T:  11/04/2007  Job:  [id number]   cc:   Deniece Portela A. Sheffield Slider, M.D.   [facility]

## 2010-12-25 NOTE — Procedures (Signed)
NAME:  Patrick Oconnor, Patrick Oconnor NO.:  [id number]   MEDICAL RECORD NO.:  [id number]          PATIENT TYPE:  OUT   LOCATION:  SLEEP CENTER                 FACILITY:  [facility]   PHYSICIAN:  Clinton D. Maple Hudson, MD, FCCP, FACPDATE OF BIRTH:  1966/01/11   DATE OF STUDY:  07/24/2007                            NOCTURNAL POLYSOMNOGRAM   REFERRING PHYSICIAN:  Wayne A. Sheffield Slider, M.D.   INDICATION FOR STUDY:  Hypersomnia with sleep apnea, a poor sleeping  score 3/24, BMI 32, weight 245 pounds, height 73 inches.   HOME MEDICATION:  Charted and reviewed.   A diagnostic NPSG on 04/15/2007 recorded an AHI of 28.3 per hour.  CPAP  titration is requested.   SLEEP ARCHITECTURE:  Total sleep time 314 minutes with sleep deficiency  75%.  Stage 1 was 14%; stage 2, 72%; stage 3 absent.  REM 13% of total  sleep time.  Sleep latency 23 minutes, REM latency 314 minutes awake  after sleep onset 79 minutes, arousal index 5.9.  Diphenhydramine 50 mg  taken at 10 p.m.   RESPIRATORY DATA:  CPAP titration protocol.  CPAP was titrated to 14  CWP, AHI 2 per hour.  A medium Mirage Swift nasal mask was chosen with  heated humidifier.   OXYGEN DATA:  Snoring was suppressed by CPAP and oxygen saturation held  at a mean of 96% on room air.   CARDIAC DATA:  Normal sinus rhythm.   MOVEMENT/PARASOMNIAS:  Periodic limb movement with arousal index 22.2  per hour.  Bathroom x1.   IMPRESSION/RECOMMENDATION:  1. Successful CPAP titration to 14 CWP, AHI 0 per hour.  A medium      pillow Mirage Swift nasal mask was used with heated humidifier.      Tech indicated that a [employer] also seemed to work well.  2. Previous diagnostic NPSG April 15, 2007 had recorded an AHI of      28.3 per hour.  3. 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  This      may reflect disturbance of CPAP      titration, but if a pattern of limb movement disturbance of sleep      persists at home, then specific  therapy such as Requip or Mirapex      might be a consideration if clinically appropriate.      Clinton D. Maple Hudson, MD, [facility], FACP  Diplomate, Biomedical engineer of Sleep Medicine  Electronically Signed     CDY/MEDQ  D:  08/02/2007 10:41:51  T:  08/03/2007 09:44:28  Job:  [id number]

## 2010-12-28 NOTE — Op Note (Signed)
   NAME:  Patrick Oconnor, Patrick Oconnor               ACCOUNT NO.:  [id number]   MEDICAL RECORD NO.:  [id number]                   PATIENT TYPE:  AMB   LOCATION:  ENDO                                 FACILITY:  [facility]   PHYSICIAN:  James L. Malon Kindle., M.D.          DATE OF BIRTH:  [birth year]   DATE OF PROCEDURE:  08/23/2002  DATE OF DISCHARGE:                                 OPERATIVE REPORT   PROCEDURE:  Colonoscopy and biopsy.   MEDICATIONS:  The patient received Fentanyl 100 mcg and Versed 8.5 for both  procedures.   SCOPE:  Olympus pediatric scope.   INDICATIONS FOR PROCEDURE:  The patient with gas, loose stool, a strong  family history of colon cancer.  This is done to look for some cause of his  continuing symptoms.   DESCRIPTION OF PROCEDURE:  The procedure had been explained to the patient  and consent obtained.  The patient was placed in flat lateral decubitus  position.  The Olympus pediatric scope was inserted and advanced under  direct visualization.  The prep was excellent.  I _____ the cecum without  difficulty with the cecal valve and appendiceal orifice seen. Scope  withdrawn, cecum and ascending colon, transverse, descending and sigmoid  colon were seen well and were unremarkable.  Several random biopsies were  taken to look for signs of microscopic colitis.  No polyps or lesions were  seen.  The rectum was free of polyps.  The scope was withdrawn.  The patient  tolerated the procedure well.   ASSESSMENT:  Essentially normal colonoscopy.   PLAN:  Will check the pathology report for both the upper and lower  endoscopy.  We will see the patient back in the office in the next several  weeks.  We will recommend a repeat colonoscopy in five years.                                               James L. Malon Kindle., M.D.    Waldron Session  D:  08/23/2002  T:  08/23/2002  Job:  [id number]   cc:   Deniece Portela A. Sheffield Slider, M.D.  [address] [place]  [place] [address]  Fax:  [phone]   [facility]

## 2010-12-28 NOTE — Op Note (Signed)
   NAME:  Patrick Oconnor, Patrick Oconnor               ACCOUNT NO.:  [id number]   MEDICAL RECORD NO.:  [id number]                   PATIENT TYPE:  AMB   LOCATION:  ENDO                                 FACILITY:  [facility]   PHYSICIAN:  James L. Malon Kindle., M.D.          DATE OF BIRTH:  11-Jun-1966   DATE OF PROCEDURE:  08/23/2002  DATE OF DISCHARGE:                                 OPERATIVE REPORT   PROCEDURE:  Esophagogastroduodenoscopy and biopsy.   MEDICATIONS:  Cetacaine spray, fentanyl 87.5 mcg, Versed 7.5 mg IV.   INDICATIONS FOR PROCEDURE:  Rolling gas, flatus, belching, chronic reflux.   DESCRIPTION OF PROCEDURE:  The procedure had been explained to the patient  and consent obtained.  With the patient in the left lateral decubitus  position, the Olympus scope was inserted and advanced under direct  visualization.  The stomach was entered, pylorus identified and passed.  The  duodenum including the bulb and second portion was unremarkable.  Several  biopsies were taken randomly of the duodenum with the full extent of the  scope to look for evidence of celiac disease.  The scope was withdrawn.  The  duodenum including the bulb and second portion were normal.  The antrum and  pyloric channel were normal.  Fundus, cardia, and body all seen well and  were normal.  There was a 2-3 cm hiatal hernia at the GE junction, otherwise  appeared normal.  The distal and proximal esophagus were endoscopically  normal.  The scope was withdrawn.  The patient tolerated the procedure well,  maintained on low-flow oxygen and pulse oximeter throughout the procedure.   ASSESSMENT:  1. Small hiatal hernia, probably some element of esophageal reflux disease.  2. Otherwise normal endoscopy.   PLAN:  1. We will continue therapy for reflux.  2. We will check biopsies for evidence of celiac disease, and we will go     ahead with colonoscopy at this time.                                               James  L. Malon Kindle., M.D.    Waldron Session  D:  08/23/2002  T:  08/23/2002  Job:  [id number]   cc:   Deniece Portela A. Sheffield Slider, M.D.  [address] [employer]  [place] [address]  Fax: [phone]

## 2011-07-09 ENCOUNTER — Other Ambulatory Visit: Payer: Self-pay | Admitting: Family Medicine

## 2011-07-09 NOTE — Telephone Encounter (Signed)
Refill request

## 2011-07-30 ENCOUNTER — Ambulatory Visit (INDEPENDENT_AMBULATORY_CARE_PROVIDER_SITE_OTHER): Payer: 59 | Admitting: Family Medicine

## 2011-07-30 ENCOUNTER — Other Ambulatory Visit: Payer: Self-pay | Admitting: Family Medicine

## 2011-07-30 ENCOUNTER — Encounter: Payer: Self-pay | Admitting: Family Medicine

## 2011-07-30 VITALS — BP 133/82 | HR 86 | Ht 74.0 in | Wt 250.0 lb

## 2011-07-30 DIAGNOSIS — IMO0001 Reserved for inherently not codable concepts without codable children: Secondary | ICD-10-CM

## 2011-07-30 DIAGNOSIS — M791 Myalgia, unspecified site: Secondary | ICD-10-CM | POA: Insufficient documentation

## 2011-07-30 DIAGNOSIS — F329 Major depressive disorder, single episode, unspecified: Secondary | ICD-10-CM

## 2011-07-30 DIAGNOSIS — G4730 Sleep apnea, unspecified: Secondary | ICD-10-CM

## 2011-07-30 DIAGNOSIS — S335XXA Sprain of ligaments of lumbar spine, initial encounter: Secondary | ICD-10-CM

## 2011-07-30 DIAGNOSIS — J018 Other acute sinusitis: Secondary | ICD-10-CM

## 2011-07-30 DIAGNOSIS — L720 Epidermal cyst: Secondary | ICD-10-CM

## 2011-07-30 DIAGNOSIS — F3289 Other specified depressive episodes: Secondary | ICD-10-CM

## 2011-07-30 DIAGNOSIS — K219 Gastro-esophageal reflux disease without esophagitis: Secondary | ICD-10-CM

## 2011-07-30 DIAGNOSIS — Z8669 Personal history of other diseases of the nervous system and sense organs: Secondary | ICD-10-CM

## 2011-07-30 DIAGNOSIS — I10 Essential (primary) hypertension: Secondary | ICD-10-CM

## 2011-07-30 DIAGNOSIS — L723 Sebaceous cyst: Secondary | ICD-10-CM

## 2011-07-30 DIAGNOSIS — E669 Obesity, unspecified: Secondary | ICD-10-CM

## 2011-07-30 LAB — COMPREHENSIVE METABOLIC PANEL
AST: 30 U/L (ref 0–37)
Sodium: 141 mEq/L (ref 135–145)

## 2011-07-30 NOTE — Progress Notes (Signed)
  Subjective:    Patient ID: Patrick Oconnor, male    DOB: 1965-11-23, 45 y.o.   MRN: [id number]  HPI His only problem recently has been in back pain which is acute at times a week and half ago he caught of falling patient and strained it believes he needs to have Vicodin on hand to help but is more severe  He also complains of more generalized arthralgias recently mostly in the wrists. He feels that he has more aches and pains that he should have after exerting himself  He notes cysts in his left inguinal area which had gotten larger but is now small. There was never any drainage warmth or acute tenderness.   He has had some more depression symptoms recently though not severe as he continues taking this of citalopram   Review of Systems     Objective:   Physical Exam  Constitutional: He appears well-developed.       obese  Cardiovascular: Normal rate and regular rhythm.   Pulmonary/Chest: Effort normal and breath sounds normal.  Abdominal: Soft. Bowel sounds are normal.  Genitourinary: Penis normal.       1 cm "cyst" subcutaneously attached to the skin in his inguinal crease lateral to the scrotum. Probable pore identified. No signs of infection. Testicles are normal and no hernia bulges   Psychiatric: He has a normal mood and affect.  Extremities. No joint warmth or swelling        Assessment & Plan:

## 2011-07-30 NOTE — Assessment & Plan Note (Signed)
Insufficiently controlled. Will increase Citalopram to 20 mg daily

## 2011-07-30 NOTE — Patient Instructions (Signed)
I will with lab results if abnormal or email normals.   Increase the Citalopram to 20 mg daily.   Recheck pending results.

## 2011-07-30 NOTE — Assessment & Plan Note (Signed)
More symptomatic after recent fall of patient

## 2011-07-30 NOTE — Assessment & Plan Note (Signed)
No improvement

## 2011-07-30 NOTE — Assessment & Plan Note (Signed)
Will check for inflammation or inc CK

## 2011-07-30 NOTE — Assessment & Plan Note (Signed)
No need for excision unless it becomes inflamed

## 2011-07-30 NOTE — Assessment & Plan Note (Signed)
well controlled  

## 2011-07-31 ENCOUNTER — Encounter: Payer: Self-pay | Admitting: Family Medicine

## 2011-07-31 LAB — CBC
HCT: 41.3 % (ref 39.0–52.0)
MCH: 29.3 pg (ref 26.0–34.0)
MCV: 85.2 fL (ref 78.0–100.0)

## 2011-07-31 LAB — PSA: PSA: 0.27 ng/mL (ref <=4.00)

## 2011-07-31 LAB — CK: Total CK: 184 U/L (ref 7–232)

## 2011-07-31 LAB — SEDIMENTATION RATE: Sed Rate: 1 mm/hr (ref 0–16)

## 2011-09-26 ENCOUNTER — Other Ambulatory Visit: Payer: Self-pay | Admitting: Family Medicine

## 2011-09-26 NOTE — Telephone Encounter (Signed)
Refill request

## 2011-09-26 NOTE — Telephone Encounter (Signed)
Refill request for patient of Dr.Hale's. 

## 2011-11-07 ENCOUNTER — Other Ambulatory Visit: Payer: Self-pay | Admitting: Family Medicine

## 2011-11-07 MED ORDER — HYDROCHLOROTHIAZIDE 25 MG PO TABS: 25.0000 mg | ORAL_TABLET | Freq: Every day | ORAL | Status: DC

## 2011-11-07 MED ORDER — OMEPRAZOLE 20 MG PO CPDR: 20.0000 mg | DELAYED_RELEASE_CAPSULE | Freq: Every day | ORAL | Status: DC

## 2011-11-22 ENCOUNTER — Ambulatory Visit (INDEPENDENT_AMBULATORY_CARE_PROVIDER_SITE_OTHER): Payer: 59 | Admitting: Family Medicine

## 2011-11-22 ENCOUNTER — Encounter: Payer: Self-pay | Admitting: Family Medicine

## 2011-11-22 VITALS — BP 140/88 | Temp 98.2°F | Ht 74.0 in | Wt 252.0 lb

## 2011-11-22 DIAGNOSIS — J029 Acute pharyngitis, unspecified: Secondary | ICD-10-CM | POA: Insufficient documentation

## 2011-11-22 MED ORDER — AMOXICILLIN-POT CLAVULANATE 875-125 MG PO TABS: 1.0000 | ORAL_TABLET | Freq: Two times a day (BID) | ORAL | Status: AC

## 2011-11-22 NOTE — Patient Instructions (Signed)
It was nice to meet you today.  I think you likely have a viral infection. Alternate your ibuprofen with the Tylenol.  Try to drink at least 8-10 glasses of fluids (water) daily. If you are feeling worse (fevers, worsening sore throat), I would star the antibiotic in the next 5 days or so. Usually viruses improve in 5-10 days.   Follow-up with Dr. Sheffield Slider next week to discuss the pulled muscle.

## 2011-11-22 NOTE — Assessment & Plan Note (Addendum)
Patient complaining of sore throat particularly on left side radiating to left ear and malaise for the past few days that is getting worse. He reports having "bronchitis" about a couple of weeks ago that resolved before the onset of current illness. He has a history of recurrent rhinosinusitis (last episode July 2012) and is s/p tonsillectomy.  I suspect he probably has another viral infection. No particularly acute or worrisome findings on physical exam and afebrile. Rapid strep negative.  Advised conservative management with ibuprofen/Tylenol prn, rest, and hydration for the next few days.  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 Advised patient to fill if feeling worse or not improved after several days of conservative management.  Patient amenable to plan.

## 2011-11-22 NOTE — Progress Notes (Signed)
  Subjective:    Patient ID: Patrick Oconnor, male    DOB: 1966/05/17, 46 y.o.   MRN: [id number]  HPI Acute visit: sore throat  He had what he thought was bronchitis about 2 weeks ago, which resolved. 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 He presents today due to worsening sore throat, particularly on left side with pain radiating to left ear. Pain is 6-7/10.  He has history of recurrent rhinosinusitis, last occurrence summer of last year.   He has been under more stress lately and not sleeping well due to pain from a pulled muscle on his right side.   Review of Systems Per HPI with inclusion of following: Denies cough, difficulty breathing Denies nausea/vomiting    Objective:   Physical Exam Gen: NAD HEENT:    Head: no TTP over sinuses    Eyes: normal conjunctiva without tearing or erythema   Ears: TM clear without erythema or effusion; mild TTP left canal with insertion of speculum   Nose: no nasal congestin or rhinorrhea   Throat: moderate erythema (no tonsils) Neck: no LAD Pulm: CTAB without w/r/r; NI WOB    Assessment & Plan:

## 2012-03-12 ENCOUNTER — Telehealth: Payer: Self-pay | Admitting: Family Medicine

## 2012-03-12 NOTE — Telephone Encounter (Signed)
Called patient.  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  He has not yet called back.  Will try again later.

## 2012-03-12 NOTE — Telephone Encounter (Addendum)
Step on nail quite deep yesterday - had tetnus 2007 - wants to talk to nurse about if he needs to be seen.  pls page him

## 2012-03-12 NOTE — Telephone Encounter (Signed)
Patient stepped off a ladder yesterday and landed on a roofing nail.  Patient says it bled profusely.  He is able to walk on it today but with difficulty.  Wants someone to look at it just to make sure it did not hit the bone.  I also told him he would need another tetanus shot even though he had one in 2007.  Gave him a [place] appointment for tomorrow am.

## 2012-03-13 ENCOUNTER — Encounter: Payer: Self-pay | Admitting: Family Medicine

## 2012-03-13 ENCOUNTER — Ambulatory Visit (INDEPENDENT_AMBULATORY_CARE_PROVIDER_SITE_OTHER): Payer: 59 | Admitting: Family Medicine

## 2012-03-13 VITALS — BP 145/86 | HR 78 | Temp 98.5°F | Ht 74.0 in | Wt 253.0 lb

## 2012-03-13 DIAGNOSIS — S91332A Puncture wound without foreign body, left foot, initial encounter: Secondary | ICD-10-CM | POA: Insufficient documentation

## 2012-03-13 DIAGNOSIS — S91309A Unspecified open wound, unspecified foot, initial encounter: Secondary | ICD-10-CM

## 2012-03-13 MED ORDER — TETANUS-DIPHTH-ACELL PERTUSSIS 5-2.5-18.5 LF-MCG/0.5 IM SUSP: 0.5000 mL | Freq: Once | INTRAMUSCULAR | Status: DC

## 2012-03-13 NOTE — Addendum Note (Signed)
Addended by: Barnie Alderman on: 03/13/2012 10:44 AM   Modules accepted: Orders

## 2012-03-13 NOTE — Assessment & Plan Note (Signed)
Exam reassuring that there is no developing infection.  New Tdap given today.  Patient advised to watch the foot closely and return if there is swelling, redness, increased pain, or any drainage.  Advised to take tylenol/ibuprofen as needed for pain.

## 2012-03-13 NOTE — Progress Notes (Signed)
  Subjective:    Patient ID: Patrick Oconnor, male    DOB: [birth year], 46 y.o.   MRN: [id number]  HPI  Arieh comes in because he stepped on a rusty nail yesterday.  His last Tdap was in 2003.  He says that the nail went pretty deep, so he just wanted to have a doctor look at it to make sure it was OK.  The pain yesterday was pretty significant, but today it is much better and he can walk on it without a limp.  It bled a lot yesterday, but is not bleeding now.  There is no redness, warmth, or drainage.  He denies fevers or chills.   Review of Systems See HPI    Objective:   Physical Exam  Vitals reviewed. Constitutional: He appears well-developed and well-nourished. No distress.  Musculoskeletal:       Patient has small 3mm puncture that has scabbed over on plantar aspect of foot, slightly proximal to metatarsal heads.  There is no induration, erythema, fluctuance.  There is some mild TTP.           Assessment & Plan:

## 2012-03-13 NOTE — Patient Instructions (Signed)
It was nice to meet you.  I do not see any sign of infection in your foot.  Please keep an eye on it, and if there is increased pain, redness, or swelling please call the office for an appointment.

## 2012-06-23 ENCOUNTER — Encounter: Payer: Self-pay | Admitting: Family Medicine

## 2012-06-23 ENCOUNTER — Ambulatory Visit (INDEPENDENT_AMBULATORY_CARE_PROVIDER_SITE_OTHER): Payer: 59 | Admitting: Family Medicine

## 2012-06-23 VITALS — BP 141/87 | HR 74 | Temp 98.3°F | Ht 72.0 in | Wt 255.9 lb

## 2012-06-23 DIAGNOSIS — M545 Low back pain, unspecified: Secondary | ICD-10-CM | POA: Insufficient documentation

## 2012-06-23 MED ORDER — HYDROCODONE-ACETAMINOPHEN 5-500 MG PO TABS: 1.0000 | ORAL_TABLET | Freq: Three times a day (TID) | ORAL | Status: DC | PRN

## 2012-06-23 MED ORDER — METAXALONE 800 MG PO TABS: 800.0000 mg | ORAL_TABLET | Freq: Three times a day (TID) | ORAL | Status: DC

## 2012-06-23 NOTE — Assessment & Plan Note (Signed)
Given length of issue and non-resolution with conservative measures--will image with MRI.  Will refer to PT and give medications as needed.  Consider ortho referral after results of MRI.

## 2012-06-23 NOTE — Progress Notes (Signed)
  Subjective:    Patient ID: Patrick Oconnor, male    DOB: 11/28/1965, 46 y.o.   MRN: [id number]  HPI  Returns with acute exacerbation of long-standing low back pain.  Reports "mice" along his spine. Pain is worse on the right.  Reports burning and paresthesias along right leg.  Notes no change in strength or sensation.  Saw an ortho in [place] last year who injected it and rubbed it, but it has not improved.  Works as a PT on [employer].  He would like referral to PT, imaging and medication.  He usually uses NSAIDS for chronic pain.  With flares, uses Tramadol, but it has not touched this intense pain he is having right now.  He reports a flare with prolonged standing and poor posture.  He is restoring a house, so overuse is a problem.      Review of Systems  Constitutional: Negative for fever and chills.  Respiratory: Negative for chest tightness and shortness of breath.   Cardiovascular: Negative for chest pain.  Gastrointestinal: Negative for abdominal pain.  Musculoskeletal: Positive for back pain, arthralgias and gait problem.       Objective:   Physical Exam  Vitals reviewed. Constitutional: He is oriented to person, place, and time. He appears well-developed and well-nourished.  HENT:  Head: Normocephalic and atraumatic.  Eyes: No scleral icterus.  Neck: Neck supple.  Cardiovascular: Normal rate.   Pulmonary/Chest: Effort normal.  Abdominal: Soft. There is no tenderness.  Musculoskeletal: He exhibits tenderness (with swelling noted in soft, mobile several cm nodules at iliac crests R>L).  Neurological: He is alert and oriented to person, place, and time.  Skin: Skin is warm and dry.  Psychiatric: He has a normal mood and affect.          Assessment & Plan:

## 2012-06-23 NOTE — Patient Instructions (Addendum)
Back Pain, Adult Low back pain is very common. About 1 in 5 people have back pain.The cause of low back pain is rarely dangerous. The pain often gets better over time.About half of people with a sudden onset of back pain feel better in just 2 weeks. About 8 in 10 people feel better by 6 weeks.  CAUSES Some common causes of back pain include:  Strain of the muscles or ligaments supporting the spine.  Wear and tear (degeneration) of the spinal discs.  Arthritis.  Direct injury to the back. DIAGNOSIS Most of the time, the direct cause of low back pain is not known.However, back pain can be treated effectively even when the exact cause of the pain is unknown.Answering your caregiver's questions about your overall health and symptoms is one of the most accurate ways to make sure the cause of your pain is not dangerous. If your caregiver needs more information, he or she may order lab work or imaging tests (X-rays or MRIs).However, even if imaging tests show changes in your back, this usually does not require surgery. HOME CARE INSTRUCTIONS For many people, back pain returns.Since low back pain is rarely dangerous, it is often a condition that people can learn to manageon their own.   Remain active. It is stressful on the back to sit or stand in one place. Do not sit, drive, or stand in one place for more than 30 minutes at a time. Take short walks on level surfaces as soon as pain allows.Try to increase the length of time you walk each day.  Do not stay in bed.Resting more than 1 or 2 days can delay your recovery.  Do not avoid exercise or work.Your body is made to move.It is not dangerous to be active, even though your back may hurt.Your back will likely heal faster if you return to being active before your pain is gone.  Pay attention to your body when you bend and lift. Many people have less discomfortwhen lifting if they bend their knees, keep the load close to their bodies,and  avoid twisting. Often, the most comfortable positions are those that put less stress on your recovering back.  Find a comfortable position to sleep. Use a firm mattress and lie on your side with your knees slightly bent. If you lie on your back, put a pillow under your knees.  Only take over-the-counter or prescription medicines as directed by your caregiver. Over-the-counter medicines to reduce pain and inflammation are often the most helpful.Your caregiver may prescribe muscle relaxant drugs.These medicines help dull your pain so you can more quickly return to your normal activities and healthy exercise.  Put ice on the injured area.  Put ice in a plastic bag.  Place a towel between your skin and the bag.  Leave the ice on for 15 to 20 minutes, 3 to 4 times a day for the first 2 to 3 days. After that, ice and heat may be alternated to reduce pain and spasms.  Ask your caregiver about trying back exercises and gentle massage. This may be of some benefit.  Avoid feeling anxious or stressed.Stress increases muscle tension and can worsen back pain.It is important to recognize when you are anxious or stressed and learn ways to manage it.Exercise is a great option. SEEK MEDICAL CARE IF:  You have pain that is not relieved with rest or medicine.  You have pain that does not improve in 1 week.  You have new symptoms.  You are generally   not feeling well. SEEK IMMEDIATE MEDICAL CARE IF:   You have pain that radiates from your back into your legs.  You develop new bowel or bladder control problems.  You have unusual weakness or numbness in your arms or legs.  You develop nausea or vomiting.  You develop abdominal pain.  You feel faint. Document Released: 07/29/2005 Document Revised: 01/28/2012 Document Reviewed: 12/17/2010 [facility] Patient Information 2013 [place], [place]. Magnetic Resonance Imaging Magnetic resonance imaging (MRI) is a procedure that lets your doctor see  detailed pictures of the inside of your body. MRI does not use X-rays. Instead, strong magnets and radio waves work together to form a Automotive engineer. There is no X-ray radiation and the magnets and radio waves are harmless. Follow any instructions you were given to prepare for the test. LET YOUR CAREGIVER KNOW ABOUT: (Note: The magnet used in MRI can cause metal objects in your body to move.)  Any previous surgery.  If you have a pacemaker or any other implants.  You have metal splinters in your body.  You have tattoos.  You are pregnant or think you may be pregnant.  You are claustrophobic (afraid of confined spaces). BEFORE THE PROCEDURE  MRI uses strong magnets, so you will be asked to remove your watch, jewelry, and other metal objects. Some makeup also contains traces of metal, so you may have to remove that too. Braces and fillings normally are not a problem. An MRI test takes up to an hour. Please check in 60 minutes before your test is scheduled or as directed. PROCEDURE  You may be asked to wear a hospital gown.  The machine can be noisy. You may be given earplugs or headphones for music.  You may be injected with CONTRAST (a special dye that enhances the image).  MRI is done in a long magnetic chamber. You will lie down on a platform that slides into the magnetic chamber. Once inside, you will still be able to talk to the technologist.  You will be asked to hold very still. The technologist will tell you when you can shift position. You may have to wait a few minutes to make sure the images are readable. AFTER THE PROCEDURE  You can get back to normal activities right away. If you were given contrast, it will pass naturally through your body within a day. Your caregiver will let you know when the results are in or you may call back to check your results. Document Released: 07/26/2000 Document Revised: 10/21/2011 Document Reviewed: 07/29/2005 [facility] Patient Information 2013  [place], [place].

## 2012-06-25 ENCOUNTER — Ambulatory Visit (HOSPITAL_COMMUNITY)
Admission: RE | Admit: 2012-06-25 | Discharge: 2012-06-25 | Disposition: A | Discharge status: Home / Self Care | Payer: 59 | Source: Ambulatory Visit | Attending: Family Medicine | Admitting: Family Medicine

## 2012-06-25 DIAGNOSIS — M545 Low back pain, unspecified: Secondary | ICD-10-CM | POA: Insufficient documentation

## 2012-06-25 DIAGNOSIS — G8929 Other chronic pain: Secondary | ICD-10-CM | POA: Insufficient documentation

## 2012-07-02 ENCOUNTER — Ambulatory Visit: Payer: 59 | Attending: Family Medicine | Admitting: Physical Therapy

## 2012-07-02 DIAGNOSIS — IMO0001 Reserved for inherently not codable concepts without codable children: Secondary | ICD-10-CM | POA: Insufficient documentation

## 2012-07-02 DIAGNOSIS — M545 Low back pain, unspecified: Secondary | ICD-10-CM | POA: Insufficient documentation

## 2012-07-16 ENCOUNTER — Ambulatory Visit: Payer: 59 | Attending: Family Medicine | Admitting: Physical Therapy

## 2012-07-16 DIAGNOSIS — M545 Low back pain, unspecified: Secondary | ICD-10-CM | POA: Insufficient documentation

## 2012-07-16 DIAGNOSIS — IMO0001 Reserved for inherently not codable concepts without codable children: Secondary | ICD-10-CM | POA: Insufficient documentation

## 2012-07-21 ENCOUNTER — Ambulatory Visit: Payer: 59 | Admitting: Physical Therapy

## 2012-07-29 ENCOUNTER — Ambulatory Visit: Payer: 59 | Admitting: Physical Therapy

## 2012-08-06 ENCOUNTER — Encounter: Payer: 59 | Admitting: Physical Therapy

## 2012-08-10 ENCOUNTER — Other Ambulatory Visit: Payer: Self-pay | Admitting: Family Medicine

## 2012-08-17 ENCOUNTER — Telehealth: Payer: Self-pay | Admitting: Family Medicine

## 2012-08-17 NOTE — Telephone Encounter (Signed)
Patient is calling asking for some form of documentation about the MRI he had a couple of months ago.  He said he would like it mailed to his home unless Dr. Sheffield Slider wanted to discuss the results.

## 2012-08-17 NOTE — Telephone Encounter (Signed)
Will fwd. To Dr.Hale for review. .[name]  

## 2012-08-19 ENCOUNTER — Encounter: Payer: 59 | Admitting: Physical Therapy

## 2012-08-21 ENCOUNTER — Encounter: Payer: Self-pay | Admitting: Family Medicine

## 2012-08-28 NOTE — Telephone Encounter (Signed)
Has this been addressed?

## 2013-01-29 ENCOUNTER — Other Ambulatory Visit: Payer: Self-pay | Admitting: Family Medicine

## 2013-02-01 NOTE — Telephone Encounter (Signed)
He needs a BMET before further refills

## 2013-02-09 ENCOUNTER — Ambulatory Visit (INDEPENDENT_AMBULATORY_CARE_PROVIDER_SITE_OTHER): Payer: 59 | Admitting: Family Medicine

## 2013-02-09 ENCOUNTER — Telehealth: Payer: Self-pay | Admitting: Family Medicine

## 2013-02-09 ENCOUNTER — Encounter: Payer: Self-pay | Admitting: Family Medicine

## 2013-02-09 VITALS — BP 125/89 | HR 70 | Ht 74.0 in | Wt 250.9 lb

## 2013-02-09 DIAGNOSIS — F3289 Other specified depressive episodes: Secondary | ICD-10-CM

## 2013-02-09 DIAGNOSIS — G4730 Sleep apnea, unspecified: Secondary | ICD-10-CM

## 2013-02-09 DIAGNOSIS — L720 Epidermal cyst: Secondary | ICD-10-CM

## 2013-02-09 DIAGNOSIS — F329 Major depressive disorder, single episode, unspecified: Secondary | ICD-10-CM

## 2013-02-09 DIAGNOSIS — L723 Sebaceous cyst: Secondary | ICD-10-CM

## 2013-02-09 DIAGNOSIS — R5383 Other fatigue: Secondary | ICD-10-CM | POA: Insufficient documentation

## 2013-02-09 DIAGNOSIS — Z8042 Family history of malignant neoplasm of prostate: Secondary | ICD-10-CM | POA: Insufficient documentation

## 2013-02-09 DIAGNOSIS — E785 Hyperlipidemia, unspecified: Secondary | ICD-10-CM

## 2013-02-09 DIAGNOSIS — I10 Essential (primary) hypertension: Secondary | ICD-10-CM

## 2013-02-09 DIAGNOSIS — R5381 Other malaise: Secondary | ICD-10-CM

## 2013-02-09 MED ORDER — OMEPRAZOLE 20 MG PO CPDR: 20.0000 mg | DELAYED_RELEASE_CAPSULE | Freq: Every day | ORAL | Status: DC

## 2013-02-09 MED ORDER — CITALOPRAM HYDROBROMIDE 10 MG PO TABS: ORAL_TABLET | ORAL | Status: DC

## 2013-02-09 MED ORDER — HYDROCHLOROTHIAZIDE 25 MG PO TABS: 25.0000 mg | ORAL_TABLET | Freq: Every day | ORAL | Status: DC

## 2013-02-09 NOTE — Telephone Encounter (Signed)
Patient is scheduled to come in today at 4:00 and is going to do his best to be here at that time considering his very heavy patient schedule.  Regardless, he needs his Omeprazole and HCTZ refilled.  If at all possible, if he could come during his lunch hour that would be better for him.

## 2013-02-09 NOTE — Assessment & Plan Note (Signed)
Not wearing CPAP due to discomfort

## 2013-02-09 NOTE — Telephone Encounter (Signed)
Will fwd to Dr. Hale.  Beatric Fulop L, CMA  

## 2013-02-09 NOTE — Assessment & Plan Note (Addendum)
Intermittently enlarges To return for recheck when it does so.

## 2013-02-09 NOTE — Progress Notes (Signed)
  Subjective:    Patient ID: Patrick Oconnor, male    DOB: March 19, 1966, 47 y.o.   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   Lightheaded - Sometimes when he stands up after sitting in the car, he will get "swooshing in his ears and feels lightheaded for 30-60 seconds. Takes his HCTZ and blood pressure isn't usually low when measured.   OSA - not wearing CPAP due to discomfort.   Back pain - not a problem recently and not taking pain or muscle relaxant medication.  Epidermal inclusion cyst - left crural fold becomes several times larger at times, but not red or tender.  Review of Systems     Objective:   Physical Exam  Constitutional:  Generalized obesity   HENT:  No bruits heard over temples or carotids  Cardiovascular: Normal rate and regular rhythm.   Pulmonary/Chest: Effort normal and breath sounds normal.  Abdominal: He exhibits no distension and no mass. There is no tenderness. There is no rebound and no guarding.  Neurological: He is alert.  Skin:  2 cm thickening of the skin left crural crease adjacent to scrotum. No pore seen. Not ref or tender          Assessment & Plan:

## 2013-02-09 NOTE — Patient Instructions (Addendum)
Please return to see Dr Sheffield Slider in 6 months.  I will be retiring in the summer of this year. Thank you for allowing me to work with you in maintaining your health. Donnella Sham, MD will be my replacement beginning the second week in August. 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 Please let me know if you would like to be assigned to a different physician.

## 2013-02-10 ENCOUNTER — Other Ambulatory Visit: Payer: 59

## 2013-02-10 DIAGNOSIS — E785 Hyperlipidemia, unspecified: Secondary | ICD-10-CM

## 2013-02-10 DIAGNOSIS — Z8042 Family history of malignant neoplasm of prostate: Secondary | ICD-10-CM

## 2013-02-10 DIAGNOSIS — R5381 Other malaise: Secondary | ICD-10-CM

## 2013-02-10 NOTE — Progress Notes (Signed)
CMP,CBC,FLP AND PSA DONE TODAY Kao Conry 

## 2013-02-11 LAB — CBC
MCV: 80.3 fL (ref 78.0–100.0)
Platelets: 188 10*3/uL (ref 150–400)
RBC: 5.34 MIL/uL (ref 4.22–5.81)
WBC: 5.4 10*3/uL (ref 4.0–10.5)

## 2013-02-11 LAB — COMPREHENSIVE METABOLIC PANEL
ALT: 30 U/L (ref 0–53)
CO2: 29 mEq/L (ref 19–32)
Calcium: 8.8 mg/dL (ref 8.4–10.5)
Chloride: 102 mEq/L (ref 96–112)
Sodium: 141 mEq/L (ref 135–145)
Total Protein: 7 g/dL (ref 6.0–8.3)

## 2013-02-11 LAB — PSA: PSA: 0.42 ng/mL (ref <=4.00)

## 2013-02-11 LAB — LIPID PANEL: Total CHOL/HDL Ratio: 5.1 Ratio

## 2013-04-06 ENCOUNTER — Encounter (HOSPITAL_COMMUNITY): Payer: Self-pay | Admitting: Pharmacy Technician

## 2013-04-07 ENCOUNTER — Encounter (HOSPITAL_COMMUNITY): Payer: Self-pay

## 2013-04-07 ENCOUNTER — Encounter (HOSPITAL_COMMUNITY)
Admission: RE | Admit: 2013-04-07 | Discharge: 2013-04-07 | Disposition: A | Discharge status: Home / Self Care | Payer: 59 | Source: Ambulatory Visit | Attending: Orthopedic Surgery | Admitting: Orthopedic Surgery

## 2013-04-07 DIAGNOSIS — Z01812 Encounter for preprocedural laboratory examination: Secondary | ICD-10-CM | POA: Insufficient documentation

## 2013-04-07 DIAGNOSIS — Z01810 Encounter for preprocedural cardiovascular examination: Secondary | ICD-10-CM | POA: Insufficient documentation

## 2013-04-07 DIAGNOSIS — Z01818 Encounter for other preprocedural examination: Secondary | ICD-10-CM | POA: Insufficient documentation

## 2013-04-07 HISTORY — DX: Essential (primary) hypertension: I10

## 2013-04-07 HISTORY — DX: Nausea with vomiting, unspecified: R11.2

## 2013-04-07 HISTORY — DX: Major depressive disorder, single episode, unspecified: F32.9

## 2013-04-07 HISTORY — DX: Irritable bowel syndrome, unspecified: K58.9

## 2013-04-07 HISTORY — DX: Pneumonia, unspecified organism: J18.9

## 2013-04-07 HISTORY — DX: Exercise induced bronchospasm: J45.990

## 2013-04-07 HISTORY — DX: Gastro-esophageal reflux disease without esophagitis: K21.9

## 2013-04-07 HISTORY — DX: Depression, unspecified: F32.A

## 2013-04-07 HISTORY — DX: Other allergy status, other than to drugs and biological substances: Z91.09

## 2013-04-07 HISTORY — DX: Other specified postprocedural states: Z98.890

## 2013-04-07 LAB — CBC
MCH: 28.8 pg (ref 26.0–34.0)
Platelets: 171 10*3/uL (ref 150–400)
RBC: 4.96 MIL/uL (ref 4.22–5.81)
WBC: 5.8 10*3/uL (ref 4.0–10.5)

## 2013-04-07 LAB — BASIC METABOLIC PANEL
Calcium: 9 mg/dL (ref 8.4–10.5)
GFR calc Af Amer: >90 mL/min (ref >90)
GFR calc non Af Amer: >90 mL/min (ref >90)
Glucose, Bld: 95 mg/dL (ref 70–99)
Sodium: 139 mEq/L (ref 135–145)

## 2013-04-09 MED ORDER — ASPIRIN 81 MG PO CHEW
CHEWABLE_TABLET | ORAL | Status: AC
  Filled 2013-04-09: qty 4

## 2013-04-14 MED ORDER — CEFAZOLIN SODIUM-DEXTROSE 2-3 GM-% IV SOLR
2.0000 g | INTRAVENOUS | Status: AC
  Administered 2013-04-15: 2 g via INTRAVENOUS
  Filled 2013-04-14: qty 50

## 2013-04-15 ENCOUNTER — Encounter (HOSPITAL_COMMUNITY)
Admission: RE | Disposition: A | Discharge status: Home / Self Care | Payer: Self-pay | Source: Ambulatory Visit | Attending: Orthopedic Surgery

## 2013-04-15 ENCOUNTER — Encounter (HOSPITAL_COMMUNITY): Payer: Self-pay | Admitting: Surgery

## 2013-04-15 ENCOUNTER — Ambulatory Visit (HOSPITAL_COMMUNITY): Payer: 59 | Admitting: Anesthesiology

## 2013-04-15 ENCOUNTER — Ambulatory Visit (HOSPITAL_COMMUNITY)
Admission: RE | Admit: 2013-04-15 | Discharge: 2013-04-15 | Disposition: A | Discharge status: Home / Self Care | Payer: 59 | Source: Ambulatory Visit | Attending: Orthopedic Surgery | Admitting: Orthopedic Surgery

## 2013-04-15 ENCOUNTER — Ambulatory Visit (HOSPITAL_COMMUNITY): Payer: 59

## 2013-04-15 ENCOUNTER — Encounter (HOSPITAL_COMMUNITY): Payer: Self-pay | Admitting: Anesthesiology

## 2013-04-15 DIAGNOSIS — Z79899 Other long term (current) drug therapy: Secondary | ICD-10-CM | POA: Insufficient documentation

## 2013-04-15 DIAGNOSIS — K589 Irritable bowel syndrome without diarrhea: Secondary | ICD-10-CM | POA: Insufficient documentation

## 2013-04-15 DIAGNOSIS — I10 Essential (primary) hypertension: Secondary | ICD-10-CM | POA: Insufficient documentation

## 2013-04-15 DIAGNOSIS — F3289 Other specified depressive episodes: Secondary | ICD-10-CM | POA: Insufficient documentation

## 2013-04-15 DIAGNOSIS — J45990 Exercise induced bronchospasm: Secondary | ICD-10-CM | POA: Insufficient documentation

## 2013-04-15 DIAGNOSIS — M24419 Recurrent dislocation, unspecified shoulder: Secondary | ICD-10-CM | POA: Insufficient documentation

## 2013-04-15 DIAGNOSIS — Z8701 Personal history of pneumonia (recurrent): Secondary | ICD-10-CM | POA: Insufficient documentation

## 2013-04-15 DIAGNOSIS — F329 Major depressive disorder, single episode, unspecified: Secondary | ICD-10-CM | POA: Insufficient documentation

## 2013-04-15 DIAGNOSIS — J301 Allergic rhinitis due to pollen: Secondary | ICD-10-CM | POA: Insufficient documentation

## 2013-04-15 DIAGNOSIS — K219 Gastro-esophageal reflux disease without esophagitis: Secondary | ICD-10-CM | POA: Insufficient documentation

## 2013-04-15 DIAGNOSIS — S43101D Unspecified dislocation of right acromioclavicular joint, subsequent encounter: Secondary | ICD-10-CM

## 2013-04-15 DIAGNOSIS — G4730 Sleep apnea, unspecified: Secondary | ICD-10-CM | POA: Insufficient documentation

## 2013-04-15 HISTORY — PX: ACROMIO-CLAVICULAR JOINT REPAIR: SHX5183

## 2013-04-15 SURGERY — REPAIR, ACROMIOCLAVICULAR JOINT
Anesthesia: General | Site: Shoulder | Laterality: Right | Wound class: Clean

## 2013-04-15 MED ORDER — LIDOCAINE HCL 1 % IJ SOLN
INTRAMUSCULAR | Status: DC | PRN
  Administered 2013-04-15: 2 mL via INTRADERMAL

## 2013-04-15 MED ORDER — EPHEDRINE SULFATE 50 MG/ML IJ SOLN
INTRAMUSCULAR | Status: DC | PRN
  Administered 2013-04-15 (×7): 5 mg via INTRAVENOUS

## 2013-04-15 MED ORDER — PROPOFOL 10 MG/ML IV BOLUS
INTRAVENOUS | Status: DC | PRN
  Administered 2013-04-15: 200 mg via INTRAVENOUS

## 2013-04-15 MED ORDER — GLYCOPYRROLATE 0.2 MG/ML IJ SOLN
INTRAMUSCULAR | Status: DC | PRN
  Administered 2013-04-15: .6 mg via INTRAVENOUS

## 2013-04-15 MED ORDER — MIDAZOLAM HCL 5 MG/5ML IJ SOLN
INTRAMUSCULAR | Status: DC | PRN
  Administered 2013-04-15: 2 mg via INTRAVENOUS

## 2013-04-15 MED ORDER — LIDOCAINE HCL (CARDIAC) 20 MG/ML IV SOLN
INTRAVENOUS | Status: DC | PRN
  Administered 2013-04-15: 100 mg via INTRAVENOUS

## 2013-04-15 MED ORDER — FENTANYL CITRATE 0.05 MG/ML IJ SOLN
INTRAMUSCULAR | Status: DC | PRN
  Administered 2013-04-15 (×5): 50 ug via INTRAVENOUS

## 2013-04-15 MED ORDER — METOCLOPRAMIDE HCL 5 MG/ML IJ SOLN: 10.0000 mg | Freq: Once | INTRAMUSCULAR | Status: DC | PRN

## 2013-04-15 MED ORDER — OXYCODONE-ACETAMINOPHEN 5-325 MG PO TABS: 1.0000 | ORAL_TABLET | ORAL | Status: DC | PRN

## 2013-04-15 MED ORDER — SODIUM CHLORIDE 0.9 % IR SOLN
Status: DC | PRN
  Administered 2013-04-15: 1000 mL

## 2013-04-15 MED ORDER — ROCURONIUM BROMIDE 100 MG/10ML IV SOLN
INTRAVENOUS | Status: DC | PRN
  Administered 2013-04-15: 50 mg via INTRAVENOUS

## 2013-04-15 MED ORDER — LACTATED RINGERS IV SOLN
INTRAVENOUS | Status: DC | PRN
  Administered 2013-04-15 (×2): via INTRAVENOUS

## 2013-04-15 MED ORDER — ROPIVACAINE HCL 5 MG/ML IJ SOLN
INTRAMUSCULAR | Status: DC | PRN
  Administered 2013-04-15: 25 mL

## 2013-04-15 MED ORDER — ARTIFICIAL TEARS OP OINT
TOPICAL_OINTMENT | OPHTHALMIC | Status: DC | PRN
  Administered 2013-04-15: 1 via OPHTHALMIC

## 2013-04-15 MED ORDER — OXYCODONE HCL 5 MG/5ML PO SOLN: 5.0000 mg | Freq: Once | ORAL | Status: DC | PRN

## 2013-04-15 MED ORDER — DEXAMETHASONE SODIUM PHOSPHATE 4 MG/ML IJ SOLN
INTRAMUSCULAR | Status: DC | PRN
  Administered 2013-04-15: 8 mg via INTRAVENOUS

## 2013-04-15 MED ORDER — ONDANSETRON HCL 4 MG/2ML IJ SOLN
INTRAMUSCULAR | Status: DC | PRN
  Administered 2013-04-15 (×2): 4 mg via INTRAVENOUS

## 2013-04-15 MED ORDER — OXYCODONE HCL 5 MG PO TABS: 5.0000 mg | ORAL_TABLET | Freq: Once | ORAL | Status: DC | PRN

## 2013-04-15 MED ORDER — CHLORHEXIDINE GLUCONATE 4 % EX LIQD: 60.0000 mL | Freq: Once | CUTANEOUS | Status: DC

## 2013-04-15 MED ORDER — HYDROMORPHONE HCL PF 1 MG/ML IJ SOLN: 0.2500 mg | INTRAMUSCULAR | Status: DC | PRN

## 2013-04-15 MED ORDER — NAPROXEN 500 MG PO TABS: 500.0000 mg | ORAL_TABLET | Freq: Two times a day (BID) | ORAL | Status: DC

## 2013-04-15 MED ORDER — METHOCARBAMOL 500 MG PO TABS: 500.0000 mg | ORAL_TABLET | Freq: Four times a day (QID) | ORAL | Status: DC

## 2013-04-15 MED ORDER — TEMAZEPAM 30 MG PO CAPS: 30.0000 mg | ORAL_CAPSULE | Freq: Every evening | ORAL | Status: DC | PRN

## 2013-04-15 MED ORDER — NEOSTIGMINE METHYLSULFATE 1 MG/ML IJ SOLN
INTRAMUSCULAR | Status: DC | PRN
  Administered 2013-04-15: 5 mg via INTRAVENOUS

## 2013-04-15 MED ORDER — LACTATED RINGERS IV SOLN
INTRAVENOUS | Status: DC
Start: 2013-04-15 — End: 2013-04-15

## 2013-04-15 SURGICAL SUPPLY — 52 items
BENZOIN TINCTURE PRP APPL 2/3 (GAUZE/BANDAGES/DRESSINGS) ×2 IMPLANT
BLADE AVERAGE 25X9 (BLADE) ×2 IMPLANT
BOOTCOVER CLEANROOM LRG (PROTECTIVE WEAR) ×4 IMPLANT
CLOTH BEACON ORANGE TIMEOUT ST (SAFETY) ×2 IMPLANT
CLSR STERI-STRIP ANTIMIC 1/2X4 (GAUZE/BANDAGES/DRESSINGS) ×2 IMPLANT
DRAPE SURG 17X11 SM STRL (DRAPES) ×2 IMPLANT
DRAPE U-SHAPE 47X51 STRL (DRAPES) ×2 IMPLANT
DRSG PAD ABDOMINAL 8X10 ST (GAUZE/BANDAGES/DRESSINGS) ×2 IMPLANT
DURAPREP 26ML APPLICATOR (WOUND CARE) ×2 IMPLANT
ELECT REM PT RETURN 9FT ADLT (ELECTROSURGICAL) ×2
ELECTRODE REM PT RTRN 9FT ADLT (ELECTROSURGICAL) ×1 IMPLANT
GLOVE BIO SURGEON STRL SZ7.5 (GLOVE) ×2 IMPLANT
GLOVE BIO SURGEON STRL SZ8 (GLOVE) ×2 IMPLANT
GLOVE EUDERMIC 7 POWDERFREE (GLOVE) ×2 IMPLANT
GLOVE SS BIOGEL STRL SZ 7.5 (GLOVE) ×1 IMPLANT
GLOVE SUPERSENSE BIOGEL SZ 7.5 (GLOVE) ×1
GOWN STRL NON-REIN LRG LVL3 (GOWN DISPOSABLE) ×2 IMPLANT
GOWN STRL REIN XL XLG (GOWN DISPOSABLE) ×8 IMPLANT
KIT AC JOINT DISP (KITS) ×2 IMPLANT
KIT BASIN OR (CUSTOM PROCEDURE TRAY) ×2 IMPLANT
KIT BIO-TENODESIS 3X8 DISP (MISCELLANEOUS)
KIT INSRT BABSR STRL DISP BTN (MISCELLANEOUS) IMPLANT
KIT ROOM TURNOVER OR (KITS) ×2 IMPLANT
MANIFOLD NEPTUNE II (INSTRUMENTS) ×2 IMPLANT
NDL SUT 6 .5 CRC .975X.05 MAYO (NEEDLE) IMPLANT
NEEDLE MAYO TAPER (NEEDLE)
NS IRRIG 1000ML POUR BTL (IV SOLUTION) ×2 IMPLANT
PACK SHOULDER (CUSTOM PROCEDURE TRAY) ×2 IMPLANT
PAD ARMBOARD 7.5X6 YLW CONV (MISCELLANEOUS) ×4 IMPLANT
SLING ARM IMMOBILIZER LRG (SOFTGOODS) ×2 IMPLANT
SLING ARM IMMOBILIZER MED (SOFTGOODS) IMPLANT
SPONGE GAUZE 4X4 12PLY (GAUZE/BANDAGES/DRESSINGS) ×2 IMPLANT
SPONGE LAP 4X18 X RAY DECT (DISPOSABLE) ×2 IMPLANT
STRIP CLOSURE SKIN 1/2X4 (GAUZE/BANDAGES/DRESSINGS) ×2 IMPLANT
SUCTION FRAZIER TIP 10 FR DISP (SUCTIONS) ×2 IMPLANT
SUT 2 FIBERLOOP 20 STRT BLUE (SUTURE) ×2
SUT FIBERWIRE #2 38 T-5 BLUE (SUTURE) ×2
SUT MNCRL AB 3-0 PS2 18 (SUTURE) ×2 IMPLANT
SUT RETRIEVER MED (INSTRUMENTS) ×4 IMPLANT
SUT VIC AB 1 CT1 27 (SUTURE) ×1
SUT VIC AB 1 CT1 27XBRD ANBCTR (SUTURE) ×1 IMPLANT
SUT VIC AB 2-0 CT1 27 (SUTURE) ×1
SUT VIC AB 2-0 CT1 TAPERPNT 27 (SUTURE) ×1 IMPLANT
SUTURE 2 FIBERLOOP 20 STRT BLU (SUTURE) ×1 IMPLANT
SUTURE FIBERWR #2 38 T-5 BLUE (SUTURE) ×1 IMPLANT
SYR 20CC LL (SYRINGE) ×2 IMPLANT
TAPE PAPER 3X10 WHT MICROPORE (GAUZE/BANDAGES/DRESSINGS) ×2 IMPLANT
TENDON ANTERIOR TIBIALIS (Tissue) ×2 IMPLANT
TOWEL OR 17X24 6PK STRL BLUE (TOWEL DISPOSABLE) ×2 IMPLANT
TOWEL OR 17X26 10 PK STRL BLUE (TOWEL DISPOSABLE) ×2 IMPLANT
WATER STERILE IRR 1000ML POUR (IV SOLUTION) ×2 IMPLANT
ZIPLOOP AC JOINT REPAIR (Orthopedic Implant) ×2 IMPLANT

## 2013-04-15 NOTE — Anesthesia Procedure Notes (Addendum)
Anesthesia Regional Block:  Interscalene brachial plexus block  Pre-Anesthetic Checklist: ,, timeout performed, Correct Patient, Correct Site, Correct Laterality, Correct Procedure, Correct Position, site marked, Risks and benefits discussed,  Surgical consent,  Pre-op evaluation,  At surgeon's request and post-op pain management  Laterality: Right  Prep: chloraprep       Needles:   Needle Type: Other     Needle Length: 9cm  Needle Gauge: 21 and 21 G    Additional Needles:  Procedures: ultrasound guided (picture in chart) Interscalene brachial plexus block Narrative:  Start time: 04/15/2013 7:02 AM End time: 04/15/2013 7:09 AM Injection made incrementally with aspirations every 5 mL.  Performed by: Personally  Anesthesiologist: Aldona Lento, MD  Additional Notes: Ultrasound guidance used to: id relevant anatomy, confirm needle position, local anesthetic spread, avoidance of vascular puncture. Picture saved. No complications. Block performed personally by Janetta Hora. Gelene Mink, MD    Interscalene brachial plexus block Procedure Name: Intubation Date/Time: 04/15/2013 8:02 AM Performed by: Sherie Don Pre-anesthesia Checklist: Patient identified, Emergency Drugs available, Suction available, Patient being monitored and Timeout performed Patient Re-evaluated:Patient Re-evaluated prior to inductionOxygen Delivery Method: Circle system utilized Preoxygenation: Pre-oxygenation with 100% oxygen Intubation Type: IV induction Ventilation: Mask ventilation without difficulty and Oral airway inserted - appropriate to patient size Laryngoscope Size: Mac and 4 (Good mask airway with oral airway in place, limited oral opening reducedTM distance and large neck, patient has OSA, DL X 1 MAC 4 blade epiglottis visible with slight view of posterior arytenoids  + etco2 and = bbs ) Grade View: Grade III Tube type: Oral Airway Equipment and Method: Stylet and Oral airway Placement  Confirmation: ETT inserted through vocal cords under direct vision,  breath sounds checked- equal and bilateral and positive ETCO2 Secured at: 23 cm Tube secured with: Tape Dental Injury: Teeth and Oropharynx as per pre-operative assessment     Anesthesia Regional Block:   Narrative:

## 2013-04-15 NOTE — Anesthesia Postprocedure Evaluation (Signed)
Anesthesia Post Note  Patient: Patrick Oconnor  Procedure(s) Performed: Procedure(s) (LRB): RIGHT SHOULDER ACROMIO-CLAVICULAR RECONSTRUCTION WITH ALLOGRAFT    (Right)  Anesthesia type: General  Patient location: PACU  Post pain: Pain level controlled  Post assessment: Patient's Cardiovascular Status Stable  Last Vitals:  Filed Vitals:   04/15/13 1115  BP: 135/86  Pulse: 92  Temp:   Resp: 18    Post vital signs: Reviewed and stable  Level of consciousness: alert  Complications: No apparent anesthesia complications

## 2013-04-15 NOTE — Op Note (Signed)
NAME:  Patrick Oconnor, Patrick Oconnor NO.:  [id number]  MEDICAL RECORD NO.:  [id number]  LOCATION:  [facility]                         FACILITY:  [facility]  PHYSICIAN:  Vania Rea. Fayth Trefry, M.D.  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  PROCEDURE: 1. Open right shoulder distal clavicle resection. 2. Open reconstruction of the coracoclavicular ligaments utilizing     combination of a zip loop static reconstruction device as well as a     biologic allograft tibialis anterior.  SURGEON:  Vania Rea. Elisa Sorlie, M.D.  ASSISTANTLaural Benes. Su Hilt, PA-C.  ANESTHESIA:  General endotracheal as well as an interscalene block.  ESTIMATED BLOOD LOSS:  Minimal.  DRAINS:  None.  HISTORY:  Patrick Oconnor is a 47 year old gentleman who sustained a right shoulder injury grade 3 AC separation while skiing.  His initial evaluation showed marked instability of the acromioclavicular articulation, but initially treated this conservatively.  However, he has gone on now to have persistent painful symptomatic AC joint instability with progression of AC joint arthritis.  Due to his increasing pain, functional limitations, and symptomatic instability, he is brought to the operating room at this time for planned right shoulder acromioclavicular joint reconstruction as described below.  Preoperatively I counseled Patrick Oconnor on treatment options as well as risks versus benefits thereof.  Possible surgical complications were reviewed including potential for bleeding, infection, neurovascular injury, persistent pain, loss of motion, recurrence of deformity and instability, and  possible anesthetic complications, possible need for additional surgery.  He understands and accepts and agrees to planned procedure.  PROCEDURE IN DETAIL:  After undergoing routine preop evaluation, the patient received prophylactic antibiotics and an interscalene block was established in the holding area by the Anesthesia Department.  Placed supine on the operating table, underwent smooth induction of general endotracheal anesthesia.  Placed into beach-chair position and then appropriately padded and protected.  Right shoulder examination under anesthesia confirmed marked instability of the distal clavicle in relation to the [facility] joint.  The right shoulder girdle region was then sterilely prepped and draped in standard fashion.  Time-out was called. A dorsal sabre incision was made over the distal clavicle extending towards the [facility] joint to approximately 5 cm.  Skin flaps were elevated. Electrocautery was used for hemostasis.  Dissection was then carried deeply and then I made a transverse incision along the course of the distal clavicle, dividing between the deltoid and trapezial interval and then exposed the distal clavicle using subperiosteal technique.  The distal clavicle showed marked arthritic change and a distal clavicle resection was performed with an oscillating saw removing approximately 1 cm of bone.  At this point, then I dissected down to the base of the coracoid and separated the soft tissues such that we could gain circumferential access about the coracoid for passage of our biologic material.  Once blunt dissection created a tunnel beneath the coracoid, I passed a passing suture.  At this  point, then we drilled with 4.5 tunnels through the distal clavicle as well as through the waist of the coracoid using the [employer] zip loop kit.  We then passed the zip loop device through the bone tunnel and the coracoid and then used fluoroscopic imaging to confirm that the metal button  had appropriately flipped and gained purchase on the undersurface of the coracoid.  We then passed the sutures up through the bone tunnel on the clavicle and passed the dog bone beneath the suture limbs and then manually reduced the clavicle towards the coracoid and then tightened the zip loop suture limbs after gaining excellent fixation and reduction of the coracoclavicular interval.  We used fluoroscopic imaging to then confirm that the overall construct and alignment was to our satisfaction.  After this was completed on the back table, we passed a whipstitch through a single end on the tibialis anterior allograft, which had been thawed and prepared appropriately.  The allograft tissue was then passed beneath the coracoid process and then the medial limb was brought posteriorly behind the clavicle and across the top of the clavicle and then both limbs were sutured to one another and to the sutures emanating from the dog bone fixation device such that we were able to place tension on the biologic tissue and suture it to the underlying suture construct with excellent fixation and reinforcement of the coracoclavicular ligament reconstruction.  The overall construct was much to our satisfaction.  We did obtain a final fluoroscopic image, which confirmed good alignment, good reduction of the acromioclavicular interval.  The wound was then irrigated.  Hemostasis was obtained.  The deltotrapezial fascial interface was then reapproximated with a series of figure-of-eight #1 Vicryl sutures, 2-0 Vicryl was used for the subcu layer, and intracuticular 3-0 Monocryl for the skin followed by Steri-Strips.  Dry dressings were applied.  The right arm was placed in a sling and the patient was then awakened, extubated, and taken to the recovery room in stable condition.     Vania Rea. Sitara Cashwell, M.D.     KMS/MEDQ  D:  04/15/2013  T:  04/15/2013  Job:  [id number]

## 2013-04-15 NOTE — Anesthesia Preprocedure Evaluation (Signed)
Anesthesia Evaluation  Patient identified by MRN, date of birth, ID band Patient awake    Reviewed: Allergy & Precautions, H&P , NPO status , Patient's Chart, lab work & pertinent test results, reviewed documented beta blocker date and time   History of Anesthesia Complications (+) PONV  Airway Mallampati: II TM Distance: >3 FB Neck ROM: full    Dental   Pulmonary neg pulmonary ROS, asthma , sleep apnea and Continuous Positive Airway Pressure Ventilation , pneumonia -, resolved,  breath sounds clear to auscultation        Cardiovascular hypertension, Pt. on medications negative cardio ROS  Rhythm:regular     Neuro/Psych PSYCHIATRIC DISORDERS Depression negative neurological ROS  negative psych ROS   GI/Hepatic negative GI ROS, Neg liver ROS, GERD-  Medicated and Controlled,  Endo/Other  negative endocrine ROS  Renal/GU negative Renal ROS  negative genitourinary   Musculoskeletal   Abdominal   Peds  Hematology negative hematology ROS (+)   Anesthesia Other Findings See surgeon's H&P   Reproductive/Obstetrics negative OB ROS                           Anesthesia Physical Anesthesia Plan  ASA: III  Anesthesia Plan: General   Post-op Pain Management:    Induction: Intravenous  Airway Management Planned: Oral ETT  Additional Equipment:   Intra-op Plan:   Post-operative Plan: Extubation in OR  Informed Consent: I have reviewed the patients History and Physical, chart, labs and discussed the procedure including the risks, benefits and alternatives for the proposed anesthesia with the patient or authorized representative who has indicated his/her understanding and acceptance.   Dental Advisory Given  Plan Discussed with: CRNA and Surgeon  Anesthesia Plan Comments:         Anesthesia Quick Evaluation

## 2013-04-15 NOTE — Preoperative (Signed)
Beta Blockers   Reason not to administer Beta Blockers:Not Applicable 

## 2013-04-15 NOTE — Op Note (Signed)
04/15/2013  10:11 AM  PATIENT:   Patrick Oconnor  47 y.o. male  PRE-OPERATIVE DIAGNOSIS:  RIGHT SHOULDER CHRONIC AC SEPARATION   POST-OPERATIVE DIAGNOSIS:  same  PROCEDURE:  Open distal clavicle resection, reconstruction of coraco-clavicular ligaments.  SURGEON:  Senaida Lange M.D.  ASSISTANTS: Roberts, pac   ANESTHESIA:   GET + ISB  EBL: minimal  SPECIMEN:  none  Drains: none   PATIENT DISPOSITION:  PACU - hemodynamically stable.    PLAN OF CARE: Discharge to home after PACU  Dictation# [id number]

## 2013-04-15 NOTE — Transfer of Care (Signed)
Immediate Anesthesia Transfer of Care Note  Patient: Patrick Oconnor  Procedure(s) Performed: Procedure(s): RIGHT SHOULDER ACROMIO-CLAVICULAR RECONSTRUCTION WITH ALLOGRAFT    (Right)  Patient Location: PACU  Anesthesia Type:General  Level of Consciousness: awake, sedated and patient cooperative  Airway & Oxygen Therapy: Patient Spontanous Breathing and Patient connected to face mask oxygen  Post-op Assessment: Report given to PACU RN, Post -op Vital signs reviewed and stable  Post vital signs: Reviewed and stable  Complications: No apparent anesthesia complications

## 2013-04-15 NOTE — H&P (Signed)
Patrick Oconnor    Chief Complaint: RIGHT SHOULDER CHRONIC AC SEPARATION  HPI: The patient is a 47 y.o. male with symptomatic right shoulder ac joint separation  Past Medical History  Diagnosis Date  . GERD (gastroesophageal reflux disease)   . Sleep apnea 9/08    Started CPAP; does not wear CPAP nightly  . PONV (postoperative nausea and vomiting)   . Hypertension   . Pneumonia     hx of  . Asthma, exercise induced   . IBS (irritable bowel syndrome)   . Environmental allergies   . Depression     Past Surgical History  Procedure Laterality Date  . Pyloroplasty  infant    pyloric stenosis  . Tonsillectomy and adenoidectomy    . Pilonidal cystectomy  42003    x 2  . Thrombosed external hemorrhoid  9/08    incised  . Esophagogastroduodenoscopy  3/09    normal  . Colonoscopy  08/12/2005    Family History  Problem Relation Age of Onset  . Cancer Paternal Grandmother     Colon  . Cancer Maternal Grandmother     breast  . Cancer Father 54    prostate Stage 4    Social History:  reports that he has never smoked. He does not have any smokeless tobacco history on file. He reports that  drinks alcohol. He reports that he does not use illicit drugs.  Allergies: No Known Allergies  Medications Prior to Admission  Medication Dose Route Frequency Provider Last Rate Last Dose  . TDaP (BOOSTRIX) injection 0.5 mL  0.5 mL Intramuscular Once Ardyth Gal, MD       Medications Prior to Admission  Medication Sig Dispense Refill  . citalopram (CELEXA) 10 MG tablet Take 10 mg by mouth daily.      . hydrochlorothiazide (HYDRODIURIL) 25 MG tablet Take 1 tablet (25 mg total) by mouth daily.  90 tablet  2  . loratadine (CLARITIN) 10 MG tablet Take 10 mg by mouth daily.        . Multiple Vitamin (MULTIVITAMIN WITH MINERALS) TABS tablet Take 1 tablet by mouth daily.      Marland Kitchen omeprazole (PRILOSEC) 20 MG capsule Take 1 capsule (20 mg total) by mouth daily.  90 capsule  3  . Probiotic  Product (PROBIOTIC DAILY PO) Take 1 tablet by mouth daily.      Marland Kitchen albuterol (PROVENTIL,VENTOLIN) 90 MCG/ACT inhaler Inhale 2 puffs into the lungs every 6 (six) hours as needed for shortness of breath.       Marland Kitchen HYDROcodone-acetaminophen (VICODIN) 5-500 MG per tablet Take 1 tablet by mouth every 8 (eight) hours as needed for pain.  30 tablet  0  . metaxalone (SKELAXIN) 800 MG tablet Take 800 mg by mouth 3 (three) times daily as needed for pain.      . traMADol (ULTRAM) 50 MG tablet Take 50-100 mg by mouth every 8 (eight) hours as needed for pain.          Physical Exam: right shoulder with painful ac separation and exam as noted at previous office visits  Vitals  Temp:  [97.1 F (36.2 C)] 97.1 F (36.2 C) (09/04 0551) Resp:  [18] 18 (09/04 0551) BP: (143)/(91) 143/91 mmHg (09/04 0551) SpO2:  [97 %] 97 % (09/04 0551)  Assessment/Plan  Impression: RIGHT SHOULDER CHRONIC AC SEPARATION   Plan of Action: Procedure(s): RIGHT SHOULDER ACROMIO-CLAVICULAR RECONSTRUCTION WITH ALLOGRAFT     Jenny Omdahl M 04/15/2013, 7:40 AM

## 2013-04-16 ENCOUNTER — Encounter (HOSPITAL_COMMUNITY): Payer: Self-pay | Admitting: Orthopedic Surgery

## 2013-06-02 ENCOUNTER — Encounter (HOSPITAL_COMMUNITY): Payer: Self-pay | Admitting: *Deleted

## 2013-06-02 ENCOUNTER — Other Ambulatory Visit (HOSPITAL_COMMUNITY): Payer: Self-pay | Admitting: *Deleted

## 2013-06-03 ENCOUNTER — Encounter (HOSPITAL_COMMUNITY): Payer: 59 | Admitting: Registered Nurse

## 2013-06-03 ENCOUNTER — Ambulatory Visit (HOSPITAL_COMMUNITY): Payer: 59 | Admitting: Registered Nurse

## 2013-06-03 ENCOUNTER — Encounter (HOSPITAL_COMMUNITY)
Admission: RE | Disposition: A | Discharge status: Home / Self Care | Payer: Self-pay | Source: Ambulatory Visit | Attending: Orthopedic Surgery

## 2013-06-03 ENCOUNTER — Encounter (HOSPITAL_COMMUNITY): Payer: Self-pay | Admitting: Pharmacy Technician

## 2013-06-03 ENCOUNTER — Observation Stay (HOSPITAL_COMMUNITY)
Admission: RE | Admit: 2013-06-03 | Discharge: 2013-06-04 | Disposition: A | Discharge status: Home / Self Care | Payer: 59 | Source: Ambulatory Visit | Attending: Orthopedic Surgery | Admitting: Orthopedic Surgery

## 2013-06-03 ENCOUNTER — Ambulatory Visit (HOSPITAL_COMMUNITY): Payer: 59

## 2013-06-03 ENCOUNTER — Encounter (HOSPITAL_COMMUNITY): Payer: Self-pay | Admitting: *Deleted

## 2013-06-03 DIAGNOSIS — T84498A Other mechanical complication of other internal orthopedic devices, implants and grafts, initial encounter: Secondary | ICD-10-CM | POA: Insufficient documentation

## 2013-06-03 DIAGNOSIS — I10 Essential (primary) hypertension: Secondary | ICD-10-CM | POA: Insufficient documentation

## 2013-06-03 DIAGNOSIS — Y834 Other reconstructive surgery as the cause of abnormal reaction of the patient, or of later complication, without mention of misadventure at the time of the procedure: Secondary | ICD-10-CM | POA: Insufficient documentation

## 2013-06-03 DIAGNOSIS — T8140XA Infection following a procedure, unspecified, initial encounter: Principal | ICD-10-CM | POA: Insufficient documentation

## 2013-06-03 DIAGNOSIS — IMO0001 Reserved for inherently not codable concepts without codable children: Secondary | ICD-10-CM

## 2013-06-03 DIAGNOSIS — K219 Gastro-esophageal reflux disease without esophagitis: Secondary | ICD-10-CM | POA: Insufficient documentation

## 2013-06-03 HISTORY — PX: INCISION AND DRAINAGE: SHX5863

## 2013-06-03 HISTORY — PX: ACROMIO-CLAVICULAR JOINT REPAIR: SHX5183

## 2013-06-03 LAB — GRAM STAIN

## 2013-06-03 LAB — CBC
MCH: 28.5 pg (ref 26.0–34.0)
MCV: 82.8 fL (ref 78.0–100.0)
Platelets: 234 10*3/uL (ref 150–400)
RDW: 12.5 % (ref 11.5–15.5)
WBC: 6.7 10*3/uL (ref 4.0–10.5)

## 2013-06-03 LAB — BASIC METABOLIC PANEL
BUN: 19 mg/dL (ref 6–23)
CO2: 24 mEq/L (ref 19–32)
Calcium: 8.9 mg/dL (ref 8.4–10.5)
Chloride: 99 mEq/L (ref 96–112)
Creatinine, Ser: 0.94 mg/dL (ref 0.50–1.35)
Glucose, Bld: 92 mg/dL (ref 70–99)

## 2013-06-03 SURGERY — INCISION AND DRAINAGE
Anesthesia: General | Site: Shoulder | Laterality: Right | Wound class: Dirty or Infected

## 2013-06-03 MED ORDER — LACTATED RINGERS IV SOLN: INTRAVENOUS | Status: DC

## 2013-06-03 MED ORDER — CITALOPRAM HYDROBROMIDE 10 MG PO TABS
10.0000 mg | ORAL_TABLET | Freq: Every day | ORAL | Status: DC
  Administered 2013-06-04: 10 mg via ORAL
  Filled 2013-06-03: qty 1

## 2013-06-03 MED ORDER — ACETAMINOPHEN 325 MG PO TABS: 650.0000 mg | ORAL_TABLET | Freq: Four times a day (QID) | ORAL | Status: DC | PRN

## 2013-06-03 MED ORDER — BUPIVACAINE-EPINEPHRINE PF 0.25-1:200000 % IJ SOLN
INTRAMUSCULAR | Status: AC
  Filled 2013-06-03: qty 30

## 2013-06-03 MED ORDER — METOCLOPRAMIDE HCL 10 MG PO TABS: 5.0000 mg | ORAL_TABLET | Freq: Three times a day (TID) | ORAL | Status: DC | PRN

## 2013-06-03 MED ORDER — ONDANSETRON HCL 4 MG/2ML IJ SOLN: 4.0000 mg | Freq: Four times a day (QID) | INTRAMUSCULAR | Status: DC | PRN

## 2013-06-03 MED ORDER — DIPHENHYDRAMINE HCL 12.5 MG/5ML PO ELIX: 12.5000 mg | ORAL_SOLUTION | ORAL | Status: DC | PRN

## 2013-06-03 MED ORDER — ONDANSETRON HCL 4 MG/2ML IJ SOLN: 4.0000 mg | Freq: Once | INTRAMUSCULAR | Status: DC | PRN

## 2013-06-03 MED ORDER — EPHEDRINE SULFATE 50 MG/ML IJ SOLN
INTRAMUSCULAR | Status: DC | PRN
  Administered 2013-06-03: 15 mg via INTRAVENOUS

## 2013-06-03 MED ORDER — ONDANSETRON HCL 4 MG/2ML IJ SOLN
INTRAMUSCULAR | Status: DC | PRN
  Administered 2013-06-03 (×2): 4 mg via INTRAVENOUS

## 2013-06-03 MED ORDER — OXYCODONE-ACETAMINOPHEN 5-325 MG PO TABS
1.0000 | ORAL_TABLET | ORAL | Status: DC | PRN
Start: 2013-06-03 — End: 2013-06-04

## 2013-06-03 MED ORDER — HYDROMORPHONE HCL PF 1 MG/ML IJ SOLN
0.2500 mg | INTRAMUSCULAR | Status: DC | PRN
  Administered 2013-06-03 (×2): 0.5 mg via INTRAVENOUS

## 2013-06-03 MED ORDER — TEMAZEPAM 15 MG PO CAPS: 15.0000 mg | ORAL_CAPSULE | Freq: Every evening | ORAL | Status: DC | PRN

## 2013-06-03 MED ORDER — METHOCARBAMOL 500 MG PO TABS: 500.0000 mg | ORAL_TABLET | Freq: Four times a day (QID) | ORAL | Status: DC | PRN

## 2013-06-03 MED ORDER — OXYCODONE HCL 5 MG/5ML PO SOLN
5.0000 mg | Freq: Once | ORAL | Status: AC | PRN
  Administered 2013-06-03: 5 mg via ORAL

## 2013-06-03 MED ORDER — MIDAZOLAM HCL 5 MG/5ML IJ SOLN
INTRAMUSCULAR | Status: DC | PRN
  Administered 2013-06-03: 2 mg via INTRAVENOUS

## 2013-06-03 MED ORDER — ACETAMINOPHEN 650 MG RE SUPP: 650.0000 mg | Freq: Four times a day (QID) | RECTAL | Status: DC | PRN

## 2013-06-03 MED ORDER — VANCOMYCIN HCL 10 G IV SOLR
1500.0000 mg | INTRAVENOUS | Status: AC
  Administered 2013-06-03: 1500 mg via INTRAVENOUS
  Filled 2013-06-03: qty 1500

## 2013-06-03 MED ORDER — NEOSTIGMINE METHYLSULFATE 1 MG/ML IJ SOLN
INTRAMUSCULAR | Status: DC | PRN
  Administered 2013-06-03: 3 mg via INTRAVENOUS

## 2013-06-03 MED ORDER — GLYCOPYRROLATE 0.2 MG/ML IJ SOLN
INTRAMUSCULAR | Status: DC | PRN
  Administered 2013-06-03: 0.4 mg via INTRAVENOUS

## 2013-06-03 MED ORDER — FENTANYL CITRATE 0.05 MG/ML IJ SOLN
INTRAMUSCULAR | Status: DC | PRN
  Administered 2013-06-03 (×2): 50 ug via INTRAVENOUS
  Administered 2013-06-03: 150 ug via INTRAVENOUS

## 2013-06-03 MED ORDER — ONDANSETRON HCL 4 MG PO TABS: 4.0000 mg | ORAL_TABLET | Freq: Four times a day (QID) | ORAL | Status: DC | PRN

## 2013-06-03 MED ORDER — ALUM & MAG HYDROXIDE-SIMETH 200-200-20 MG/5ML PO SUSP: 30.0000 mL | ORAL | Status: DC | PRN

## 2013-06-03 MED ORDER — PROPOFOL 10 MG/ML IV BOLUS
INTRAVENOUS | Status: DC | PRN
  Administered 2013-06-03: 50 mg via INTRAVENOUS
  Administered 2013-06-03: 150 mg via INTRAVENOUS

## 2013-06-03 MED ORDER — BUPIVACAINE-EPINEPHRINE 0.25% -1:200000 IJ SOLN
INTRAMUSCULAR | Status: DC | PRN
  Administered 2013-06-03: 12 mL

## 2013-06-03 MED ORDER — PANTOPRAZOLE SODIUM 40 MG PO TBEC
40.0000 mg | DELAYED_RELEASE_TABLET | Freq: Every day | ORAL | Status: DC
  Administered 2013-06-04: 40 mg via ORAL
  Filled 2013-06-03: qty 1

## 2013-06-03 MED ORDER — METHOCARBAMOL 100 MG/ML IJ SOLN
500.0000 mg | INTRAVENOUS | Status: AC
  Administered 2013-06-03: 500 mg via INTRAVENOUS
  Filled 2013-06-03: qty 5

## 2013-06-03 MED ORDER — ROCURONIUM BROMIDE 100 MG/10ML IV SOLN
INTRAVENOUS | Status: DC | PRN
  Administered 2013-06-03: 50 mg via INTRAVENOUS

## 2013-06-03 MED ORDER — ALBUTEROL 90 MCG/ACT IN AERS: 2.0000 | INHALATION_SPRAY | Freq: Four times a day (QID) | RESPIRATORY_TRACT | Status: DC | PRN

## 2013-06-03 MED ORDER — PHENYLEPHRINE HCL 10 MG/ML IJ SOLN
INTRAMUSCULAR | Status: DC | PRN
  Administered 2013-06-03 (×2): 80 ug via INTRAVENOUS

## 2013-06-03 MED ORDER — HYDROCHLOROTHIAZIDE 25 MG PO TABS
25.0000 mg | ORAL_TABLET | Freq: Every day | ORAL | Status: DC
  Administered 2013-06-03 – 2013-06-04 (×2): 25 mg via ORAL
  Filled 2013-06-03 (×2): qty 1

## 2013-06-03 MED ORDER — PHENOL 1.4 % MT LIQD: 1.0000 | OROMUCOSAL | Status: DC | PRN

## 2013-06-03 MED ORDER — HYDROMORPHONE HCL PF 1 MG/ML IJ SOLN: 0.2500 mg | INTRAMUSCULAR | Status: DC | PRN

## 2013-06-03 MED ORDER — MENTHOL 3 MG MT LOZG: 1.0000 | LOZENGE | OROMUCOSAL | Status: DC | PRN

## 2013-06-03 MED ORDER — DEXTROSE 5 % IV SOLN
500.0000 mg | Freq: Four times a day (QID) | INTRAVENOUS | Status: DC | PRN
  Filled 2013-06-03: qty 5

## 2013-06-03 MED ORDER — OXYCODONE HCL 5 MG PO TABS: 5.0000 mg | ORAL_TABLET | Freq: Once | ORAL | Status: AC | PRN

## 2013-06-03 MED ORDER — MEPERIDINE HCL 25 MG/ML IJ SOLN: 6.2500 mg | INTRAMUSCULAR | Status: DC | PRN

## 2013-06-03 MED ORDER — LORATADINE 10 MG PO TABS
10.0000 mg | ORAL_TABLET | Freq: Every day | ORAL | Status: DC
  Administered 2013-06-04: 10 mg via ORAL
  Filled 2013-06-03: qty 1

## 2013-06-03 MED ORDER — DEXAMETHASONE SODIUM PHOSPHATE 4 MG/ML IJ SOLN
INTRAMUSCULAR | Status: DC | PRN
  Administered 2013-06-03: 8 mg via INTRAVENOUS

## 2013-06-03 MED ORDER — VANCOMYCIN HCL IN DEXTROSE 1-5 GM/200ML-% IV SOLN
1000.0000 mg | Freq: Two times a day (BID) | INTRAVENOUS | Status: AC
  Administered 2013-06-03: 1000 mg via INTRAVENOUS
  Filled 2013-06-03: qty 200

## 2013-06-03 MED ORDER — DOCUSATE SODIUM 100 MG PO CAPS
100.0000 mg | ORAL_CAPSULE | Freq: Two times a day (BID) | ORAL | Status: DC
  Administered 2013-06-03 – 2013-06-04 (×2): 100 mg via ORAL
  Filled 2013-06-03 (×3): qty 1

## 2013-06-03 MED ORDER — METOCLOPRAMIDE HCL 5 MG/ML IJ SOLN: 5.0000 mg | Freq: Three times a day (TID) | INTRAMUSCULAR | Status: DC | PRN

## 2013-06-03 MED ORDER — HYDROMORPHONE HCL PF 1 MG/ML IJ SOLN
INTRAMUSCULAR | Status: AC
  Filled 2013-06-03: qty 1

## 2013-06-03 MED ORDER — KETOROLAC TROMETHAMINE 15 MG/ML IJ SOLN
15.0000 mg | Freq: Four times a day (QID) | INTRAMUSCULAR | Status: DC
  Administered 2013-06-03 – 2013-06-04 (×3): 15 mg via INTRAVENOUS
  Filled 2013-06-03 (×6): qty 1

## 2013-06-03 MED ORDER — LIDOCAINE HCL (CARDIAC) 20 MG/ML IV SOLN
INTRAVENOUS | Status: DC | PRN
  Administered 2013-06-03: 100 mg via INTRAVENOUS

## 2013-06-03 MED ORDER — ALBUTEROL SULFATE HFA 108 (90 BASE) MCG/ACT IN AERS: 2.0000 | INHALATION_SPRAY | Freq: Four times a day (QID) | RESPIRATORY_TRACT | Status: DC | PRN

## 2013-06-03 MED ORDER — OXYCODONE HCL 5 MG/5ML PO SOLN
ORAL | Status: AC
  Administered 2013-06-03: 5 mg via ORAL
  Filled 2013-06-03: qty 5

## 2013-06-03 MED ORDER — LACTATED RINGERS IV SOLN
INTRAVENOUS | Status: DC
  Administered 2013-06-03 (×2): via INTRAVENOUS

## 2013-06-03 MED ORDER — ARTIFICIAL TEARS OP OINT
TOPICAL_OINTMENT | OPHTHALMIC | Status: DC | PRN
  Administered 2013-06-03: 1 via OPHTHALMIC

## 2013-06-03 SURGICAL SUPPLY — 54 items
CLOTH BEACON ORANGE TIMEOUT ST (SAFETY) ×2 IMPLANT
CLSR STERI-STRIP ANTIMIC 1/2X4 (GAUZE/BANDAGES/DRESSINGS) ×2 IMPLANT
COVER SURGICAL LIGHT HANDLE (MISCELLANEOUS) ×4 IMPLANT
DRAPE INCISE IOBAN 66X45 STRL (DRAPES) ×2 IMPLANT
DRAPE ORTHO SPLIT 77X108 STRL (DRAPES) ×1
DRAPE SURG 17X11 SM STRL (DRAPES) ×2 IMPLANT
DRAPE SURG ORHT 6 SPLT 77X108 (DRAPES) ×1 IMPLANT
DRAPE U-SHAPE 47X51 STRL (DRAPES) ×2 IMPLANT
DRSG MEPILEX BORDER 4X4 (GAUZE/BANDAGES/DRESSINGS) ×2 IMPLANT
DRSG PAD ABDOMINAL 8X10 ST (GAUZE/BANDAGES/DRESSINGS) ×2 IMPLANT
DURAPREP 26ML APPLICATOR (WOUND CARE) ×2 IMPLANT
ELECT CAUTERY BLADE 6.4 (BLADE) ×2 IMPLANT
ELECT REM PT RETURN 9FT ADLT (ELECTROSURGICAL) ×2
ELECTRODE REM PT RTRN 9FT ADLT (ELECTROSURGICAL) ×1 IMPLANT
GLOVE BIO SURGEON STRL SZ7.5 (GLOVE) ×2 IMPLANT
GLOVE BIO SURGEON STRL SZ8 (GLOVE) ×2 IMPLANT
GLOVE EUDERMIC 7 POWDERFREE (GLOVE) ×2 IMPLANT
GLOVE SS BIOGEL STRL SZ 7.5 (GLOVE) ×1 IMPLANT
GLOVE SUPERSENSE BIOGEL SZ 7.5 (GLOVE) ×1
GOWN STRL NON-REIN LRG LVL3 (GOWN DISPOSABLE) ×2 IMPLANT
GOWN STRL REIN XL XLG (GOWN DISPOSABLE) ×4 IMPLANT
HANDPIECE INTERPULSE COAX TIP (DISPOSABLE) ×1
KIT AC JOINT DISP (KITS) ×2 IMPLANT
KIT BASIN OR (CUSTOM PROCEDURE TRAY) ×2 IMPLANT
KIT ROOM TURNOVER OR (KITS) ×2 IMPLANT
MANIFOLD NEPTUNE II (INSTRUMENTS) ×2 IMPLANT
NEEDLE 1/2 CIR CATGUT .05X1.09 (NEEDLE) IMPLANT
NEEDLE HYPO 25GX1X1/2 BEV (NEEDLE) ×2 IMPLANT
NS IRRIG 1000ML POUR BTL (IV SOLUTION) ×2 IMPLANT
PACK SHOULDER (CUSTOM PROCEDURE TRAY) ×2 IMPLANT
PAD ARMBOARD 7.5X6 YLW CONV (MISCELLANEOUS) ×4 IMPLANT
SET HNDPC FAN SPRY TIP SCT (DISPOSABLE) ×1 IMPLANT
SLING ARM IMMOBILIZER LRG (SOFTGOODS) ×2 IMPLANT
SPONGE GAUZE 4X4 12PLY (GAUZE/BANDAGES/DRESSINGS) ×2 IMPLANT
SPONGE LAP 4X18 X RAY DECT (DISPOSABLE) ×4 IMPLANT
STRIP CLOSURE SKIN 1/2X4 (GAUZE/BANDAGES/DRESSINGS) ×2 IMPLANT
SUCTION FRAZIER TIP 10 FR DISP (SUCTIONS) ×2 IMPLANT
SUT FIBERWIRE #2 38 T-5 BLUE (SUTURE)
SUT MNCRL AB 3-0 PS2 18 (SUTURE) ×2 IMPLANT
SUT VIC AB 1 CT1 27 (SUTURE) ×1
SUT VIC AB 1 CT1 27XBRD ANBCTR (SUTURE) ×1 IMPLANT
SUT VIC AB 2-0 CT1 27 (SUTURE) ×1
SUT VIC AB 2-0 CT1 TAPERPNT 27 (SUTURE) ×1 IMPLANT
SUT VIC AB 2-0 SH 27 (SUTURE)
SUT VIC AB 2-0 SH 27X BRD (SUTURE) IMPLANT
SUTURE FIBERWR #2 38 T-5 BLUE (SUTURE) IMPLANT
SWAB COLLECTION DEVICE MRSA (MISCELLANEOUS) ×2 IMPLANT
SYR 20CC LL (SYRINGE) ×2 IMPLANT
SYR CONTROL 10ML LL (SYRINGE) ×2 IMPLANT
TOWEL OR 17X24 6PK STRL BLUE (TOWEL DISPOSABLE) ×2 IMPLANT
TOWEL OR 17X26 10 PK STRL BLUE (TOWEL DISPOSABLE) ×2 IMPLANT
TUBE ANAEROBIC SPECIMEN COL (MISCELLANEOUS) ×2 IMPLANT
WATER STERILE IRR 1000ML POUR (IV SOLUTION) ×2 IMPLANT
ZIPLOOP AC JOINT REPAIR (Orthopedic Implant) ×2 IMPLANT

## 2013-06-03 NOTE — Op Note (Signed)
06/03/2013  2:14 PM  PATIENT:   Patrick Oconnor  47 y.o. male  PRE-OPERATIVE DIAGNOSIS:  right shoulder infection  POST-OPERATIVE DIAGNOSIS:  Right shoulder inflammation, with possible reaction to allograft vs infection  PROCEDURE:  Irrigation and debridement right shoulder with removal of allograft tissue, revision reconstruction of coracoclavicular ligaments.  SURGEON:  Senaida Lange M.D.  ASSISTANTS: Shuford pac   ANESTHESIA:   GET + local  EBL: 150cc  SPECIMEN:  Cultures of deep wound, and portion of allograft sent for culture.  Drains: none   PATIENT DISPOSITION:  PACU - hemodynamically stable.    PLAN OF CARE: Admit for overnight observation  Dictation# [id number]

## 2013-06-03 NOTE — Anesthesia Postprocedure Evaluation (Signed)
Anesthesia Post Note  Patient: Patrick Oconnor  Procedure(s) Performed: Procedure(s) (LRB): INCISION AND DRAINAGE RIGHT SHOULDER (Right) ACROMIO-CLAVICULAR JOINT REPAIR (Right)  Anesthesia type: general  Patient location: PACU  Post pain: Pain level controlled  Post assessment: Patient's Cardiovascular Status Stable  Last Vitals:  Filed Vitals:   06/03/13 1515  BP: 134/85  Pulse: 91  Temp:   Resp: 21    Post vital signs: Reviewed and stable  Level of consciousness: sedated  Complications: No apparent anesthesia complications

## 2013-06-03 NOTE — Preoperative (Signed)
Beta Blockers   Reason not to administer Beta Blockers:Not Applicable 

## 2013-06-03 NOTE — Transfer of Care (Signed)
Immediate Anesthesia Transfer of Care Note  Patient: Patrick Oconnor  Procedure(s) Performed: Procedure(s): INCISION AND DRAINAGE RIGHT SHOULDER (Right) ACROMIO-CLAVICULAR JOINT REPAIR (Right)  Patient Location: PACU  Anesthesia Type:General  Level of Consciousness: awake, alert  and oriented  Airway & Oxygen Therapy: Patient Spontanous Breathing and Patient connected to nasal cannula oxygen  Post-op Assessment: Report given to PACU RN  Post vital signs: Reviewed  Complications: No apparent anesthesia complications

## 2013-06-03 NOTE — Anesthesia Preprocedure Evaluation (Addendum)
Anesthesia Evaluation  Patient identified by MRN, date of birth, ID band Patient awake    Reviewed: Allergy & Precautions, H&P , NPO status   History of Anesthesia Complications (+) PONV  Airway Mallampati: II TM Distance: >3 FB Neck ROM: Full    Dental  (+) Teeth Intact and Dental Advisory Given   Pulmonary asthma , sleep apnea , pneumonia -, resolved,  Non compliant with CPAP. Exercise induced asthma         Cardiovascular hypertension, Pt. on medications     Neuro/Psych PSYCHIATRIC DISORDERS Depression    GI/Hepatic GERD-  Medicated and Controlled,  Endo/Other    Renal/GU      Musculoskeletal   Abdominal   Peds  Hematology   Anesthesia Other Findings   Reproductive/Obstetrics                           Anesthesia Physical Anesthesia Plan  ASA: III  Anesthesia Plan: General   Post-op Pain Management:    Induction: Intravenous  Airway Management Planned: Oral ETT  Additional Equipment:   Intra-op Plan:   Post-operative Plan: Extubation in OR  Informed Consent: I have reviewed the patients History and Physical, chart, labs and discussed the procedure including the risks, benefits and alternatives for the proposed anesthesia with the patient or authorized representative who has indicated his/her understanding and acceptance.     Plan Discussed with: CRNA and Surgeon  Anesthesia Plan Comments:       Anesthesia Quick Evaluation

## 2013-06-03 NOTE — H&P (Signed)
Patrick Oconnor    Chief Complaint: right shoulder infection HPI: The patient is a 47 y.o. male s/p right shoulder CC ligament reconstruction 04/15/13 presents with increasing pain, swelling and drainage from right shoulder incision. MRI yesterday confirms fluid collection concerning for infection and evidence that repair construct has failed. Plan is for I and D with possible IV vs PO abx.  Past Medical History  Diagnosis Date  . GERD (gastroesophageal reflux disease)   . Sleep apnea 9/08    Started CPAP; does not wear CPAP nightly  . PONV (postoperative nausea and vomiting)   . Hypertension   . Pneumonia     hx of  . Asthma, exercise induced   . IBS (irritable bowel syndrome)   . Environmental allergies   . Depression     Past Surgical History  Procedure Laterality Date  . Pyloroplasty  infant    pyloric stenosis  . Tonsillectomy and adenoidectomy    . Pilonidal cystectomy  42003    x 2  . Thrombosed external hemorrhoid  9/08    incised  . Esophagogastroduodenoscopy  3/09    normal  . Colonoscopy  08/12/2005  . Acromio-clavicular joint repair Right 04/15/2013    Procedure: RIGHT SHOULDER ACROMIO-CLAVICULAR RECONSTRUCTION WITH ALLOGRAFT   ;  Surgeon: Patrick Lange, MD;  Location: [facility] OR;  Service: Orthopedics;  Laterality: Right;  . Septoplasty      Family History  Problem Relation Age of Onset  . Cancer Paternal Grandmother     Colon  . Cancer Maternal Grandmother     breast  . Cancer Father 42    prostate Stage 4    Social History:  reports that he has never smoked. He has never used smokeless tobacco. He reports that he drinks alcohol. He reports that he does not use illicit drugs.  Allergies: Not on File  Medications Prior to Admission  Medication Sig Dispense Refill  . albuterol (PROVENTIL,VENTOLIN) 90 MCG/ACT inhaler Inhale 2 puffs into the lungs every 6 (six) hours as needed for shortness of breath.       . citalopram (CELEXA) 10 MG tablet Take 10 mg by  mouth daily.      . hydrochlorothiazide (HYDRODIURIL) 25 MG tablet Take 1 tablet (25 mg total) by mouth daily.  90 tablet  2  . loratadine (CLARITIN) 10 MG tablet Take 10 mg by mouth daily.       . methocarbamol (ROBAXIN) 500 MG tablet Take 1 tablet (500 mg total) by mouth 4 (four) times daily.  30 tablet  1  . Multiple Vitamin (MULTIVITAMIN WITH MINERALS) TABS tablet Take 1 tablet by mouth daily as needed (for vitamin).       . naproxen (NAPROSYN) 500 MG tablet Take 1 tablet (500 mg total) by mouth 2 (two) times daily with a meal.  60 tablet  1  . omeprazole (PRILOSEC) 20 MG capsule Take 1 capsule (20 mg total) by mouth daily.  90 capsule  3  . oxyCODONE-acetaminophen (ROXICET) 5-325 MG per tablet Take 1 tablet by mouth every 4 (four) hours as needed for pain.  40 tablet  0  . Probiotic Product (PROBIOTIC DAILY PO) Take 1 tablet by mouth daily as needed (probiotic).       Marland Kitchen temazepam (RESTORIL) 30 MG capsule Take 1 capsule (30 mg total) by mouth at bedtime as needed for sleep.  30 capsule  0     Physical Exam: right shoulder with swelling and erythema about incision as noted  in office yesterday.  Vitals  Temp:  [97.9 F (36.6 C)] 97.9 F (36.6 C) (10/23 1016) Pulse Rate:  [74] 74 (10/23 1016) Resp:  [20] 20 (10/23 1016) BP: (144)/(90) 144/90 mmHg (10/23 1016) SpO2:  [98 %] 98 % (10/23 1016) Weight:  [112.401 kg (247 lb 12.8 oz)] 112.401 kg (247 lb 12.8 oz) (10/23 1112)  Assessment/Plan  Impression: right shoulder infection  Plan of Action: Procedure(s): INCISION AND DRAINAGE RIGHT SHOULDER  Patrick Oconnor M 06/03/2013, 12:05 PM

## 2013-06-04 ENCOUNTER — Encounter (HOSPITAL_COMMUNITY): Payer: Self-pay | Admitting: Orthopedic Surgery

## 2013-06-04 DIAGNOSIS — T8140XA Infection following a procedure, unspecified, initial encounter: Secondary | ICD-10-CM

## 2013-06-04 DIAGNOSIS — B9689 Other specified bacterial agents as the cause of diseases classified elsewhere: Secondary | ICD-10-CM

## 2013-06-04 MED ORDER — RIFAMPIN 300 MG PO CAPS
300.0000 mg | ORAL_CAPSULE | Freq: Two times a day (BID) | ORAL | Status: DC
  Administered 2013-06-04: 300 mg via ORAL
  Filled 2013-06-04 (×2): qty 1

## 2013-06-04 MED ORDER — SULFAMETHOXAZOLE-TMP DS 800-160 MG PO TABS
2.0000 | ORAL_TABLET | Freq: Two times a day (BID) | ORAL | Status: DC
  Administered 2013-06-04: 2 via ORAL
  Filled 2013-06-04 (×2): qty 2

## 2013-06-04 MED ORDER — NAPROXEN 500 MG PO TABS: 500.0000 mg | ORAL_TABLET | Freq: Two times a day (BID) | ORAL | Status: DC

## 2013-06-04 MED ORDER — SULFAMETHOXAZOLE-TMP DS 800-160 MG PO TABS: 2.0000 | ORAL_TABLET | Freq: Two times a day (BID) | ORAL | Status: DC

## 2013-06-04 MED ORDER — RIFAMPIN 300 MG PO CAPS: 300.0000 mg | ORAL_CAPSULE | Freq: Two times a day (BID) | ORAL | Status: DC

## 2013-06-04 NOTE — Progress Notes (Signed)
Patient discharged to home accompanied by family. Discharge instructions and rx given and explained and patient stated understanding. Patient instructed to follow up with Dr. Rennis Chris in one week and Dr. Luciana Axe in 2 weeks. Patients IV was removed and patient left unit in a stable condition.

## 2013-06-04 NOTE — Discharge Summary (Signed)
PATIENT ID:      Patrick Oconnor  MRN:     [id number] DOB/AGE:    49-Oct-1967 / 49 y.o.     DISCHARGE SUMMARY  ADMISSION DATE:    06/03/2013 DISCHARGE DATE:    ADMISSION DIAGNOSIS: right shoulder infection Past Medical History  Diagnosis Date  . GERD (gastroesophageal reflux disease)   . Sleep apnea 9/08    Started CPAP; does not wear CPAP nightly  . PONV (postoperative nausea and vomiting)   . Hypertension   . Pneumonia     hx of  . Asthma, exercise induced   . IBS (irritable bowel syndrome)   . Environmental allergies   . Depression     DISCHARGE DIAGNOSIS:   Active Problems:   * No active hospital problems. *   PROCEDURE: Procedure(s): INCISION AND DRAINAGE RIGHT SHOULDER ACROMIO-CLAVICULAR JOINT REPAIR on 06/03/2013  CONSULTS:   Infectious Disease, Dr. Luciana Axe  HISTORY:  See H&P in chart.  HOSPITAL COURSE:  Patrick Oconnor is a 47 y.o. admitted on 06/03/2013 with a chief complaint of right shoulder pain follwing an AC joint reconstruction several weeks ago and was taken to OR for fear  of right shoulder infection.  They were brought to the operating room on 06/03/2013 and underwent Procedure(s): INCISION AND DRAINAGE RIGHT SHOULDER ACROMIO-CLAVICULAR JOINT REPAIR.    They were given perioperative antibiotics: Anti-infectives   Start     Dose/Rate Route Frequency Ordered Stop   06/04/13 1100  sulfamethoxazole-trimethoprim (BACTRIM DS) 800-160 MG per tablet 2 tablet     2 tablet Oral Every 12 hours 06/04/13 1005     06/04/13 1100  rifampin (RIFADIN) capsule 300 mg     300 mg Oral Every 12 hours 06/04/13 1005     06/04/13 0000  rifampin (RIFADIN) 300 MG capsule     300 mg Oral Every 12 hours 06/04/13 1357     06/04/13 0000  sulfamethoxazole-trimethoprim (BACTRIM DS) 800-160 MG per tablet     2 tablet Oral Every 12 hours 06/04/13 1357     06/03/13 2000  vancomycin (VANCOCIN) IVPB 1000 mg/200 mL premix     1,000 mg 200 mL/hr over 60 Minutes Intravenous Every 12  hours 06/03/13 1801 06/03/13 2134   06/03/13 1145  vancomycin (VANCOCIN) 1,500 mg in sodium chloride 0.9 % 500 mL IVPB     1,500 mg 250 mL/hr over 120 Minutes Intravenous To Surgery 06/03/13 1141 06/03/13 1300    .  Patient underwent the above named procedure and tolerated it well. The following day they were hemodynamically stable and pain was controlled on oral analgesics. They were evaluated by ID and felt safe to DC on PO antibiotics with close follow up as outpatient. They were neurovascularly intact to the operative extremity. l. They were medically and orthopaedically stable for discharge on day 1    DIAGNOSTIC STUDIES:  RECENT RADIOGRAPHIC STUDIES :  Dg Shoulder Right  06/03/2013   CLINICAL DATA:  Right AC joint repair.  EXAM: RIGHT SHOULDER - 2+ VIEW; DG C-ARM 1-60 MIN  COMPARISON:  04/15/2013  FINDINGS: Four C arm views of the right shoulder demonstrate interval resection of the distal right clavicle. Coracoclavicular ligament fixation buttons remain in place.  IMPRESSION: Operative changes, as described above   Electronically Signed   By: Gordan Payment M.D.   On: 06/03/2013 14:30   Dg C-arm 1-60 Min  06/03/2013   CLINICAL DATA:  Right AC joint repair.  EXAM: RIGHT SHOULDER - 2+ VIEW; DG C-ARM  1-60 MIN  COMPARISON:  04/15/2013  FINDINGS: Four C arm views of the right shoulder demonstrate interval resection of the distal right clavicle. Coracoclavicular ligament fixation buttons remain in place.  IMPRESSION: Operative changes, as described above   Electronically Signed   By: Gordan Payment M.D.   On: 06/03/2013 14:30    RECENT VITAL SIGNS:  Patient Vitals for the past 24 hrs:  BP Temp Temp src Pulse Resp SpO2 Height Weight  06/04/13 1300 144/85 mmHg 97.7 F (36.5 C) - 70 18 100 % - -  06/04/13 0500 139/62 mmHg 98 F (36.7 C) - 84 18 99 % - -  06/03/13 2206 143/74 mmHg 98.6 F (37 C) - 97 18 96 % - -  06/03/13 1800 129/73 mmHg 97.8 F (36.6 C) Oral 96 - 95 % 6\' 2"  (1.88 m) 112.038  kg (247 lb)  06/03/13 1700 138/82 mmHg 98.2 F (36.8 C) - 101 16 96 % - -  06/03/13 1645 - - - 100 21 99 % - -  06/03/13 1630 136/78 mmHg - - 89 15 96 % - -  06/03/13 1615 131/78 mmHg - - 92 9 96 % - -  06/03/13 1600 144/88 mmHg - - 95 20 98 % - -  06/03/13 1545 132/76 mmHg - - 88 8 96 % - -  06/03/13 1530 136/80 mmHg - - 95 18 99 % - -  06/03/13 1515 134/85 mmHg - - 91 21 100 % - -  06/03/13 1500 - - - 81 10 98 % - -  06/03/13 1455 132/79 mmHg - - 83 15 99 % - -  06/03/13 1445 148/81 mmHg - - 87 13 99 % - -  06/03/13 1433 - 97.1 F (36.2 C) - 83 14 100 % - -  .  RECENT EKG RESULTS:    Orders placed during the hospital encounter of 04/07/13  . EKG 12-LEAD  . EKG 12-LEAD    DISCHARGE INSTRUCTIONS:    DISCHARGE MEDICATIONS:     Medication List         albuterol 90 MCG/ACT inhaler  Commonly known as:  PROVENTIL,VENTOLIN  Inhale 2 puffs into the lungs every 6 (six) hours as needed for shortness of breath.     citalopram 10 MG tablet  Commonly known as:  CELEXA  Take 10 mg by mouth daily.     CLARITIN 10 MG tablet  Generic drug:  loratadine  Take 10 mg by mouth daily.     hydrochlorothiazide 25 MG tablet  Commonly known as:  HYDRODIURIL  Take 1 tablet (25 mg total) by mouth daily.     methocarbamol 500 MG tablet  Commonly known as:  ROBAXIN  Take 1 tablet (500 mg total) by mouth 4 (four) times daily.     multivitamin with minerals Tabs tablet  Take 1 tablet by mouth daily as needed (for vitamin).     naproxen 500 MG tablet  Commonly known as:  NAPROSYN  Take 1 tablet (500 mg total) by mouth 2 (two) times daily with a meal.     naproxen 500 MG tablet  Commonly known as:  NAPROSYN  Take 1 tablet (500 mg total) by mouth 2 (two) times daily with a meal.     omeprazole 20 MG capsule  Commonly known as:  PRILOSEC  Take 1 capsule (20 mg total) by mouth daily.     oxyCODONE-acetaminophen 5-325 MG per tablet  Commonly known as:  ROXICET  Take 1 tablet by  mouth  every 4 (four) hours as needed for pain.     PROBIOTIC DAILY PO  Take 1 tablet by mouth daily as needed (probiotic).     rifampin 300 MG capsule  Commonly known as:  RIFADIN  Take 1 capsule (300 mg total) by mouth every 12 (twelve) hours.     sulfamethoxazole-trimethoprim 800-160 MG per tablet  Commonly known as:  BACTRIM DS  Take 2 tablets by mouth every 12 (twelve) hours.     temazepam 30 MG capsule  Commonly known as:  RESTORIL  Take 1 capsule (30 mg total) by mouth at bedtime as needed for sleep.        FOLLOW UP VISIT:    DISCHARGE TO: Home  DISPOSITION:  good DISCHARGE CONDITION:  Good   Earnie Bechard for Dr. Francena Hanly 06/04/2013, 1:57 PM

## 2013-06-04 NOTE — Progress Notes (Signed)
Patrick Oconnor  MRN: [id number] DOB/Age: 1966-02-02 47 y.o. Physician: Jacquelyne Balint Procedure: Procedure(s) (LRB): INCISION AND DRAINAGE RIGHT SHOULDER (Right) ACROMIO-CLAVICULAR JOINT REPAIR (Right)     Subjective: Pain controlled, no specific c/o but anxious to go home  Vital Signs Temp:  [97.1 F (36.2 C)-98.6 F (37 C)] 98 F (36.7 C) (10/24 0500) Pulse Rate:  [74-101] 84 (10/24 0500) Resp:  [8-21] 18 (10/24 0500) BP: (129-148)/(62-90) 139/62 mmHg (10/24 0500) SpO2:  [95 %-100 %] 99 % (10/24 0500) Weight:  [112.038 kg (247 lb)-112.401 kg (247 lb 12.8 oz)] 112.038 kg (247 lb) (10/23 1800)  Lab Results  Recent Labs  06/03/13 1051  WBC 6.7  HGB 12.9*  HCT 37.5*  PLT 234   BMET  Recent Labs  06/03/13 1051  NA 137  K 4.0  CL 99  CO2 24  GLUCOSE 92  BUN 19  CREATININE 0.94  CALCIUM 8.9   No results found for this basename: inr     Exam Right shoulder dressing dry,  NVI        Plan I spoke with Dr. Luciana Axe who is to see pt this am for ongoing recommendations on antibiotic coverage. We are hopeful to let him go home later today on PO abx regimen if appropriate  Will await his rec's  [facility] for Dr.Kevin Supple 06/04/2013, 9:29 AM

## 2013-06-04 NOTE — Care Management Utilization Note (Signed)
Utilization review completed. Mao Lockner, RN BSN 

## 2013-06-04 NOTE — Consult Note (Addendum)
[facility]     Reason for Consult: deep infection of shoulder    Referring Physician: Dr. Rennis Chris  Active Problems:   * No active hospital problems. *   . citalopram  10 mg Oral Daily  . docusate sodium  100 mg Oral BID  . hydrochlorothiazide  25 mg Oral Daily  . ketorolac  15 mg Intravenous Q6H  . loratadine  10 mg Oral Daily  . pantoprazole  40 mg Oral Daily  . rifampin  300 mg Oral Q12H  . sulfamethoxazole-trimethoprim  2 tablet Oral Q12H    Recommendations: Bactrim DS 2 tabs bid and rifampin 300 mg po bid for 2 weeks  Prescriptions written for discharge We will follow up with him the week after next to assure he is doing well  Thanks for the consult  Assessment: He likely has an early deep wound infection not in the capsule joint with a positive gram stain and some WBCs.  GPC so will need to cover for Staph and Strep.     Antibiotics: Doxycycline prior to surgery, held for 3 days prior to surgery  HPI: Patrick Oconnor is a 47 y.o. male with with history of right shoulder ligament reconstruction done in September of this year and initially did well but developed increasing pain, swelling and drainage from shoulder.  MRI noted fluid collection (not available), though did not enter joint.  No purulence found in OR and was really serosanguinous fluid, though a few WBCs and GPC noted on gram stain.     Review of Systems: A comprehensive review of systems was negative.  Past Medical History  Diagnosis Date  . GERD (gastroesophageal reflux disease)   . Sleep apnea 9/08    Started CPAP; does not wear CPAP nightly  . PONV (postoperative nausea and vomiting)   . Hypertension   . Pneumonia     hx of  . Asthma, exercise induced   . IBS (irritable bowel syndrome)   . Environmental allergies   . Depression     History  Substance Use Topics  . Smoking status: Never Smoker   . Smokeless tobacco: Never Used  . Alcohol Use: 0.0 oz/week      .25 drink(s) per week     Comment: 10 yearly    Family History  Problem Relation Age of Onset  . Cancer Paternal Grandmother     Colon  . Cancer Maternal Grandmother     breast  . Cancer Father 23    prostate Stage 4   No Known Allergies  OBJECTIVE: Blood pressure 139/62, pulse 84, temperature 98 F (36.7 C), temperature source Oral, resp. rate 18, height 6\' 2"  (1.88 m), weight 247 lb (112.038 kg), SpO2 99.00%. General: awake, alert, nad Skin: no rashes Lungs: CTA B Cor: RRR Abdomen: soft, nt, nd Ext: no edema  Microbiology: Recent Results (from the past 240 hour(s))  ANAEROBIC CULTURE     Status: None   Collection Time    06/03/13  1:00 PM      Result Value Range Status   Specimen Description WOUND SHOULDER RIGHT   Final   Special Requests CORACOCLAVICULAR SPACE   Final   Gram Stain     Final   Value: FEW WBC PRESENT,BOTH PMN AND MONONUCLEAR     RARE GRAM POSITIVE COCCI IN PAIRS     Gram Stain Report Called to,Read Back By and Verified With: Gram Stain Report Called to,Read Back By and Verified With:  Delene Ruffini RN @1436  ON 06/03/13 BY A BROWNING Performed at [facility]     Performed at [facility]   Culture PENDING   Incomplete   Report Status PENDING   Incomplete  WOUND CULTURE     Status: None   Collection Time    06/03/13  1:00 PM      Result Value Range Status   Specimen Description WOUND SHOULDER RIGHT   Final   Special Requests CORACOCLAVICULAR SPACE   Final   Gram Stain     Final   Value: FEW WBC PRESENT,BOTH PMN AND MONONUCLEAR     RARE GRAM POSITIVE COCCI IN PAIRS     Gram Stain Report Called to,Read Back By and Verified With: Gram Stain Report Called to,Read Back By and Verified With:  T KASIK @1436  ON 06/03/13 BY A BROWNING Performed at [facility]     Performed at [facility]   Culture     Final   Value: NO GROWTH 1 DAY     Performed at [facility]   Report Status PENDING   Incomplete  GRAM STAIN      Status: None   Collection Time    06/03/13  1:00 PM      Result Value Range Status   Specimen Description WOUND SHOULDER RIGHT   Final   Special Requests CORACOCLAVICULAR SPACE   Final   Gram Stain     Final   Value: FEW WBC PRESENT,BOTH PMN AND MONONUCLEAR     RARE GRAM POSITIVE COCCI IN PAIRS     Gram Stain Report Called to,Read Back By and Verified With: T Gypsy Balsam RN 1436 06/03/13 A BROWNING   Report Status 06/03/2013 FINAL   Final  ANAEROBIC CULTURE     Status: None   Collection Time    06/03/13  1:12 PM      Result Value Range Status   Specimen Description TISSUE SHOULDER RIGHT   Final   Special Requests RIGHT SHOULDER ALLOGRAPH   Final   Gram Stain     Final   Value: FEW WBC PRESENT,BOTH PMN AND MONONUCLEAR     NO ORGANISMS SEEN     Gram Stain Report Called to,Read Back By and Verified With: Gram Stain Report Called to,Read Back By and Verified With:  T KASIK @1436  ON 06/03/13 BY A BROWNING  Performed at [facility]     Performed at [facility]   Culture PENDING   Incomplete   Report Status PENDING   Incomplete  TISSUE CULTURE     Status: None   Collection Time    06/03/13  1:12 PM      Result Value Range Status   Specimen Description TISSUE SHOULDER RIGHT   Final   Special Requests RIGHT SHOULDER ALLOGRAPH   Final   Gram Stain     Final   Value: FEW WBC PRESENT,BOTH PMN AND MONONUCLEAR     NO ORGANISMS SEEN     Gram Stain Report Called to,Read Back By and Verified With: Gram Stain Report Called to,Read Back By and Verified With:  T KASIK @1436N  ON 06/03/13 BY A BROWNING Performed at [facility]     Performed at [facility]   Culture     Final   Value: NO GROWTH 1 DAY     Performed at [facility]   Report Status PENDING   Incomplete  GRAM STAIN     Status: None  Collection Time    06/03/13  1:12 PM      Result Value Range Status   Specimen Description TISSUE SHOULDER RIGHT   Final   Special Requests RIGHT SHOULDER ALLOGRAPH    Final   Gram Stain     Final   Value: FEW WBC PRESENT,BOTH PMN AND MONONUCLEAR     NO ORGANISMS SEEN     Gram Stain Report Called to,Read Back By and Verified With: Delene Ruffini RN 1436 06/03/13 A BROWNING   Report Status 06/03/2013 FINAL   Final    Staci Righter, MD [facility] [email or web address] [phone] pager  [phone] cell 06/04/2013, 10:06 AM

## 2013-06-04 NOTE — Op Note (Signed)
NAMEMarland Oconnor  MIRO, BALDERSON NO.:  [id number]  MEDICAL RECORD NO.:  [id number]  LOCATION:  5N11C                        FACILITY:  [facility]  PHYSICIAN:  Vania Rea. Ciella Obi, M.D.  DATE OF BIRTH:  Mar 25, 1966  DATE OF PROCEDURE:  06/03/2013 DATE OF DISCHARGE:                              OPERATIVE REPORT   PREOPERATIVE DIAGNOSIS:  Right shoulder pain and swelling with drainage and concerning for possible deep infection.  POSTOPERATIVE DIAGNOSIS:  Right shoulder inflammation involving the recent surgical site with a possible reaction to the allograft tissue versus a low-grade infection.  PROCEDURE:  Exploration, irrigation and debridement of right shoulder with removal of allograft tissue and a revision reconstruction of the coracoclavicular ligaments utilizing a  ZipLoop suture construct.  SURGEON:  Vania Rea. Tama Grosz, MD  ASSISTANT:  Lucita Lora. Shuford, PA-C  ANESTHESIA:  General endotracheal as well as local.  ESTIMATED BLOOD LOSS:  150 mL.  DRAINS:  None.  SPECIMENS:  I did obtain cultures of the deep wound from the region of the coracoclavicular space and also sent a portion of the extracted allograft for culture.  DRAINS:  None.  HISTORY:  Patrick Oconnor is a 47 year old gentleman, who underwent a right shoulder AC joint reconstruction with allograft back on April 15, 2013.  He had an uneventful initial postoperative recovery, but approximately 3 weeks postop presented to the office after an episode where it had a severe coughing spell and had vigorously jerked the shoulder and arm and was concerned regarding some significant increased pain about the right shoulder with subsequent swelling.  Returned the following week with complaints of drainage from a sinus tract at very proximal aspect of the wound, which had developed over that interval. We had empirically placed him on course of oral doxycycline at his most recent followup and his wound have been improving.   He subsequently took a trip to [place] for almost 3 weeks and upon his return, he was evaluated in the office yesterday where he was found to have moderately increased swelling and pain about the shoulder with some recurrent drainage now emanating from midportion of the incision.  Due to his persistent pain and deteriorating functional status, we did obtain an MRI scan yesterday which showed that the coracoclavicular reconstruction construct had failed with apparent fracture or erosion through the tip of the coracoid.  There were also collections of fluid concerning for inflammation versus possible infection.  He was subsequently brought to the operating room at this time for planned exploration, irrigation and debridement and possible revision of the reconstruction.  I preoperatively counseled Patrick Oconnor on treatment options as well as risks versus benefits thereof.  Possible surgical complications were reviewed including potential for bleeding, infection, neurovascular injury, persistent pain, loss of motion, recurrence of instability, anesthetic complication, and possible need for additional surgery.  He understands and accepts and agrees with our planned procedure.  PROCEDURE IN DETAIL:  After undergoing routine preop evaluation, the patient was brought to the operating room, placed supine on the operating table.  Underwent smooth induction of general endotracheal anesthesia.  We held antibiotics at this point to obtain cultures.  We did not use interscalene block.  He was placed into beach  chair in appropriate padded position and appropriately padded and protected.  The right shoulder girdle region was sterilely prepped and draped in standard fashion.  Time-out was called.  I reopened the previous incision 6 cm over the distal clavicle and coracoid region.  The skin flaps were elevated and dissection carried deeply.  Electrocautery was used for hemostasis.  I did not find any  discrete fluid collection and certainly there was no evidence of purulence or foul smell in necrotic tissue.  There was some generalized edema in the deeper tissue layers, but again no obvious purulence and overall quality of tissues appeared quite good.  I elevated the anterior deltoid from the clavicle and gained access to the subclavicular region down towards the coracoid and found that the allograft tissue had become markedly degenerated and was very stringy and friable and had a somewhat nonviable appearance, although certainly not purulent or foul smelling.  We went ahead and removed all of this tissue in a piecemeal fashion with a rongeur and sent portions of this for culture.  I also sampled fluid from the subclavicular space.  This was sent for culture as well.  Once all of the allograft tissue was meticulously removed, we then identified our previous  ZipLoop construct and we were able to remove the metal button that had previously been beneath the coracoid and this was dissected free and the entire construct was then removed.  I could see where the coracoid tip had failed with the button pulling through, although there was at least 2/3rd of the base of the coracoid, which remained intact that appeared to be a reasonable option for revision reconstruction of the coracoclavicular ligaments.  We copiously irrigated the entire wound and meticulously removed all allograft tissue.  At this point, we elected to proceed with a revision CC ligament reconstruction of the ZipLoop.  I used a guide pin and reamer to create a tunnel through the more proximal base of the coracoid.  We then obtained another  ZipLoop construct and this was passed through the original tunnel in the clavicle and down to the new hole in the coracoid.  The button flipped. We used fluoroscopic imaging to confirm the button had appropriately spun obtaining proper fixation.  We then tightened the ZipLoop and provided  compressive forces of the distal clavicle to reduce the coracoclavicular interval and then obtained final x-rays to confirm that the overall alignment was to our satisfaction.  The extra sutures were then trimmed.  The wound was copiously irrigated.  Hemostasis was obtained.  I then performed a repair of the deep fascial layers and the anterior deltoid back to the trapezial fascia, reinforced in the reconstruction nicely.  A 2-0 Vicryl was used to close the subcu layer and intracuticular 3-0 Monocryl for the skin followed by Steri-Strips. We liberally injected 0.5% plain Marcaine about the incision.  Dry dressing was then applied.  Right arm was placed into a sling and the patient was awakened, extubated, and taken to recovery room in stable condition.     Vania Rea. Dondrell Loudermilk, M.D.     KMS/MEDQ  D:  06/03/2013  T:  06/04/2013  Job:  [id number]

## 2013-06-05 LAB — WOUND CULTURE

## 2013-06-08 LAB — ANAEROBIC CULTURE

## 2013-06-11 LAB — TISSUE CULTURE

## 2013-06-16 ENCOUNTER — Encounter: Payer: Self-pay | Admitting: Internal Medicine

## 2013-06-16 ENCOUNTER — Ambulatory Visit (INDEPENDENT_AMBULATORY_CARE_PROVIDER_SITE_OTHER): Payer: 59 | Admitting: Internal Medicine

## 2013-06-16 VITALS — BP 129/85 | HR 101 | Temp 98.4°F | Ht 74.0 in | Wt 251.0 lb

## 2013-06-16 DIAGNOSIS — Z5189 Encounter for other specified aftercare: Secondary | ICD-10-CM

## 2013-06-16 DIAGNOSIS — T8149XA Infection following a procedure, other surgical site, initial encounter: Secondary | ICD-10-CM | POA: Insufficient documentation

## 2013-06-16 DIAGNOSIS — IMO0001 Reserved for inherently not codable concepts without codable children: Secondary | ICD-10-CM

## 2013-06-16 LAB — SEDIMENTATION RATE: Sed Rate: 30 mm/hr — ABNORMAL HIGH (ref 0–16)

## 2013-06-16 LAB — C-REACTIVE PROTEIN: CRP: 3.5 mg/dL — ABNORMAL HIGH (ref <0.60)

## 2013-06-16 NOTE — Assessment & Plan Note (Signed)
Doing well.  I am going to check his ESR and CrP and be sure no significant inflammation and if ok, will stop at 2 weeks (tomorrow).  Return as needed.

## 2013-06-16 NOTE — Progress Notes (Signed)
  Subjective:    Patient ID: Patrick Oconnor, male    DOB: 01-09-1966, 47 y.o.   MRN: [id number]  HPI Patrick Oconnor is a 47 y.o. male with with history of right shoulder ligament reconstruction done in September of this year and initially did well but developed increasing pain, swelling and drainage from shoulder. MRI noted fluid collection (not available), though did not enter joint. No purulence found in OR and was really serosanguinous fluid, though a few WBCs and GPC noted on gram stain.  He is here today for follow up on Bactrim and Rifampin.  Some GI issues with meds but has been compliant.  Now about 2 weeks after surgery.  No fever, no chillls.  Good movement with shoulder though has ROM restriction while it heals.  Overall feels like it has healed.      Review of Systems  Constitutional: Negative for fever and chills.  Respiratory: Negative for shortness of breath.   Cardiovascular: Negative for chest pain and leg swelling.  Gastrointestinal: Positive for nausea and diarrhea. Negative for vomiting, abdominal pain and blood in stool.  Musculoskeletal: Negative for joint swelling.  Neurological: Negative for dizziness, light-headedness and headaches.  Hematological: Negative for adenopathy.  Psychiatric/Behavioral: Negative for dysphoric mood.       Objective:   Physical Exam  Constitutional: He appears well-developed and well-nourished. No distress.  HENT:  Mouth/Throat: No oropharyngeal exudate.  Eyes: No scleral icterus.  Cardiovascular: Normal rate, regular rhythm and normal heart sounds.   No murmur heard. Pulmonary/Chest: Effort normal and breath sounds normal. No respiratory distress.  Musculoskeletal: He exhibits no edema.  Incision closed, dry, no discharge  Neurological: He is alert.  Skin: No rash noted.  Psychiatric: He has a normal mood and affect.          Assessment & Plan:

## 2013-06-17 ENCOUNTER — Telehealth: Payer: Self-pay | Admitting: *Deleted

## 2013-06-17 ENCOUNTER — Other Ambulatory Visit: Payer: Self-pay

## 2013-06-17 LAB — ANAEROBIC CULTURE

## 2013-06-17 MED ORDER — CEPHALEXIN 500 MG PO CAPS: 500.0000 mg | ORAL_CAPSULE | Freq: Four times a day (QID) | ORAL | Status: DC

## 2013-06-17 NOTE — Telephone Encounter (Signed)
Patient notified and appt scheduled for 07/01/13. Patrick Oconnor

## 2013-06-17 NOTE — Telephone Encounter (Signed)
Message copied by Macy Mis on Thu Jun 17, 2013 10:49 AM ------      Message from: Gardiner Barefoot      Created: Thu Jun 17, 2013  8:42 AM       He still has quite a bit of inflammation on his inflammatory markers.  I would like him to continue antibiotics for another 2 weeks and return here for a recheck.  I would like to change him to Keflex 500 mg qid for the two weeks since his current therapy causes GI upset.  So he can stop the Bactrim and rifampin.  Thanks ------

## 2013-06-17 NOTE — Addendum Note (Signed)
Addended by: Gardiner Barefoot on: 06/17/2013 10:47 AM   Modules accepted: Orders

## 2013-06-23 ENCOUNTER — Other Ambulatory Visit: Payer: Self-pay | Admitting: Internal Medicine

## 2013-06-23 MED ORDER — AMOXICILLIN 500 MG PO CAPS: 500.0000 mg | ORAL_CAPSULE | Freq: Three times a day (TID) | ORAL | Status: DC

## 2013-06-24 ENCOUNTER — Telehealth: Payer: Self-pay | Admitting: *Deleted

## 2013-06-24 NOTE — Telephone Encounter (Signed)
Message copied by Andree Coss on Thu Jun 24, 2013  8:42 AM ------      Message from: Gardiner Barefoot      Created: Wed Jun 23, 2013  5:29 PM       His culture has just grown out Propionibacterium (he has a shoulder infection).  He should start amoxicillin 500 mg tid and stop the Bactrim.  He should take it for a month (I have sent in prescription).  Change his follow up to 2 weeks from now instead of next week.  Thanks ------

## 2013-06-24 NOTE — Telephone Encounter (Signed)
RN called patient, he will begin the Amoxicillin today.  Rescheduled for 12/1 with Dr. Luciana Axe. Andree Coss, RN

## 2013-07-01 ENCOUNTER — Ambulatory Visit: Payer: 59 | Admitting: Internal Medicine

## 2013-07-12 ENCOUNTER — Ambulatory Visit: Payer: 59 | Admitting: Internal Medicine

## 2013-07-12 ENCOUNTER — Telehealth: Payer: Self-pay | Admitting: *Deleted

## 2013-07-12 NOTE — Telephone Encounter (Signed)
Requested pt call for a new appt. 

## 2013-07-14 ENCOUNTER — Encounter: Payer: Self-pay | Admitting: Internal Medicine

## 2013-07-14 ENCOUNTER — Ambulatory Visit (INDEPENDENT_AMBULATORY_CARE_PROVIDER_SITE_OTHER): Payer: 59 | Admitting: Internal Medicine

## 2013-07-14 VITALS — BP 122/83 | HR 68 | Temp 98.1°F | Ht 74.0 in | Wt 255.0 lb

## 2013-07-14 DIAGNOSIS — Z5189 Encounter for other specified aftercare: Secondary | ICD-10-CM

## 2013-07-14 DIAGNOSIS — IMO0001 Reserved for inherently not codable concepts without codable children: Secondary | ICD-10-CM

## 2013-07-14 NOTE — Progress Notes (Signed)
  Subjective:    Patient ID: Patrick Oconnor, male    DOB: Apr 24, 1966, 47 y.o.   MRN: [id number]  HPI  Patrick Oconnor is a 47 y.o. male with with history of right shoulder ligament reconstruction done in September of this year and initially did well but developed increasing pain, swelling and drainage from shoulder. MRI noted fluid collection (not available), though did not enter joint. No purulence found in OR and was really serosanguinous fluid, though a few WBCs and GPC noted on gram stain.  He was started on Bactrim and Rifampin and was changed to Keflex.  However after his last visit, his culture did grow out Propionibacterium. I then started him on amoxicillin 2 weeks ago. He now has been doing well for the last 2 weeks and still no problems with rash, diarrhea or new issues. His shoulder continues to be healing well.    Review of Systems  Constitutional: Negative for fever and chills.  Respiratory: Negative for shortness of breath.   Cardiovascular: Negative for chest pain and leg swelling.  Gastrointestinal: Negative for nausea, vomiting, abdominal pain, diarrhea and blood in stool.  Musculoskeletal: Negative for joint swelling.  Neurological: Negative for dizziness, light-headedness and headaches.  Hematological: Negative for adenopathy.  Psychiatric/Behavioral: Negative for dysphoric mood.       Objective:   Physical Exam  Constitutional: He appears well-developed and well-nourished. No distress.  HENT:  Mouth/Throat: No oropharyngeal exudate.  Eyes: No scleral icterus.  Cardiovascular: Normal rate, regular rhythm and normal heart sounds.   No murmur heard. Pulmonary/Chest: Effort normal and breath sounds normal. No respiratory distress.  Musculoskeletal: He exhibits no edema.  Incision closed, dry, no discharge  Neurological: He is alert.  Skin: No rash noted.  Psychiatric: He has a normal mood and affect.          Assessment & Plan:

## 2013-07-14 NOTE — Assessment & Plan Note (Addendum)
Doing well on his new therapy. I will check his inflammatory markers as I do anticipate now been having some significant improvement now we have the organism and he is on appropriate therapy. We'll have him return in about 2 weeks and we'll consider stopping if he continues to do well.

## 2013-07-15 LAB — SEDIMENTATION RATE: Sed Rate: 8 mm/hr (ref 0–16)

## 2013-08-02 ENCOUNTER — Ambulatory Visit: Payer: 59 | Admitting: Internal Medicine

## 2013-09-24 ENCOUNTER — Emergency Department: Payer: Self-pay | Admitting: Emergency Medicine

## 2013-09-24 LAB — URINALYSIS, COMPLETE
Bilirubin,UR: NEGATIVE
Blood: NEGATIVE
Glucose,UR: NEGATIVE mg/dL (ref 0–75)
Hyaline Cast: 1
Leukocyte Esterase: NEGATIVE
Nitrite: NEGATIVE
Ph: 5 (ref 4.5–8.0)
Protein: 30
RBC,UR: 1 /HPF (ref 0–5)
SPECIFIC GRAVITY: 1.027 (ref 1.003–1.030)
SQUAMOUS EPITHELIAL: NONE SEEN
WBC UR: 2 /HPF (ref 0–5)

## 2013-09-24 LAB — CBC WITH DIFFERENTIAL/PLATELET
BASOS ABS: 0 10*3/uL (ref 0.0–0.1)
BASOS PCT: 0.2 %
EOS PCT: 1.2 %
Eosinophil #: 0.1 10*3/uL (ref 0.0–0.7)
HCT: 48 % (ref 40.0–52.0)
HGB: 16.4 g/dL (ref 13.0–18.0)
Lymphocyte #: 0.6 10*3/uL — ABNORMAL LOW (ref 1.0–3.6)
Lymphocyte %: 6 %
MCH: 28.4 pg (ref 26.0–34.0)
MCHC: 34.1 g/dL (ref 32.0–36.0)
MCV: 83 fL (ref 80–100)
MONO ABS: 0.8 x10 3/mm (ref 0.2–1.0)
MONOS PCT: 7.6 %
NEUTROS PCT: 85 %
Neutrophil #: 9.1 10*3/uL — ABNORMAL HIGH (ref 1.4–6.5)
PLATELETS: 240 10*3/uL (ref 150–440)
RBC: 5.77 10*6/uL (ref 4.40–5.90)
RDW: 15 % — ABNORMAL HIGH (ref 11.5–14.5)
WBC: 10.7 10*3/uL — AB (ref 3.8–10.6)

## 2013-09-24 LAB — COMPREHENSIVE METABOLIC PANEL
ALBUMIN: 4.4 g/dL (ref 3.4–5.0)
AST: 34 U/L (ref 15–37)
Alkaline Phosphatase: 116 U/L
Anion Gap: 1 — ABNORMAL LOW (ref 7–16)
BILIRUBIN TOTAL: 0.8 mg/dL (ref 0.2–1.0)
BUN: 27 mg/dL — ABNORMAL HIGH (ref 7–18)
CALCIUM: 8.9 mg/dL (ref 8.5–10.1)
CO2: 26 mmol/L (ref 21–32)
Chloride: 106 mmol/L (ref 98–107)
Creatinine: 0.99 mg/dL (ref 0.60–1.30)
EGFR (African American): >60
EGFR (Non-African Amer.): >60
Glucose: 111 mg/dL — ABNORMAL HIGH (ref 65–99)
OSMOLALITY: 272 (ref 275–301)
POTASSIUM: 3.5 mmol/L (ref 3.5–5.1)
SGPT (ALT): 40 U/L (ref 12–78)
Sodium: 133 mmol/L — ABNORMAL LOW (ref 136–145)
Total Protein: 8.3 g/dL — ABNORMAL HIGH (ref 6.4–8.2)

## 2013-09-24 LAB — LIPASE, BLOOD: Lipase: 127 U/L (ref 73–393)

## 2013-12-01 ENCOUNTER — Other Ambulatory Visit: Payer: Self-pay | Admitting: Family Medicine

## 2014-01-07 ENCOUNTER — Ambulatory Visit (INDEPENDENT_AMBULATORY_CARE_PROVIDER_SITE_OTHER): Payer: 59 | Admitting: Family Medicine

## 2014-01-07 ENCOUNTER — Encounter: Payer: Self-pay | Admitting: Family Medicine

## 2014-01-07 VITALS — BP 140/94 | HR 72 | Ht 74.0 in | Wt 255.0 lb

## 2014-01-07 DIAGNOSIS — L723 Sebaceous cyst: Secondary | ICD-10-CM

## 2014-01-07 DIAGNOSIS — F3289 Other specified depressive episodes: Secondary | ICD-10-CM

## 2014-01-07 DIAGNOSIS — Z0000 Encounter for general adult medical examination without abnormal findings: Secondary | ICD-10-CM | POA: Insufficient documentation

## 2014-01-07 DIAGNOSIS — F329 Major depressive disorder, single episode, unspecified: Secondary | ICD-10-CM

## 2014-01-07 DIAGNOSIS — Z0001 Encounter for general adult medical examination with abnormal findings: Secondary | ICD-10-CM | POA: Insufficient documentation

## 2014-01-07 DIAGNOSIS — I10 Essential (primary) hypertension: Secondary | ICD-10-CM

## 2014-01-07 DIAGNOSIS — L720 Epidermal cyst: Secondary | ICD-10-CM

## 2014-01-07 MED ORDER — ASPIRIN 81 MG PO TABS: 81.0000 mg | ORAL_TABLET | Freq: Every day | ORAL | Status: DC

## 2014-01-07 MED ORDER — HYDROCHLOROTHIAZIDE 25 MG PO TABS: 25.0000 mg | ORAL_TABLET | Freq: Every day | ORAL | Status: DC

## 2014-01-07 MED ORDER — CITALOPRAM HYDROBROMIDE 10 MG PO TABS: 10.0000 mg | ORAL_TABLET | Freq: Every day | ORAL | Status: DC

## 2014-01-07 MED ORDER — OMEPRAZOLE 20 MG PO CPDR: 20.0000 mg | DELAYED_RELEASE_CAPSULE | Freq: Every day | ORAL | Status: DC

## 2014-01-07 NOTE — Progress Notes (Signed)
   Subjective:    Patient ID: Patrick Oconnor, male    DOB: [birth year], 48 y.o.   MRN: [id number]  HPI 48 y/o male presents for annual preventative visit.   HTN - currently on HCTZ 25 mg daily, needs refills, missed today's dose, no chest pain, no headaches, no vision changes  Depression - patient states stable on Celexa 10 mg daily, denies current depression or anhedonia, no SI or HI  Acid Reflux - currently controlled on Omeprazole 20 mg daily  Left groin swelling - previous diagnosis of epidermal inclusion cyst, has intermittent swelling and tenderness, no associated drainage, it becomes painful when it swells  Cancer Screening:  Colonoscopy 2009 was unremarkable    Prostate - family history of prostate CA, last PSA 11 months ago was normal,     admits to occasional decreased flow, does sit to urinate most times as     this gives him improved flow, no hesitancy, no nocturia     Immunizations:  Tdap 2013    Influenza 2013  POA: He does not have a POA/Living will.   Reviewed Social and Family History and updated in EPIC   Review of Systems  Constitutional: Negative for fever, chills and fatigue.  Respiratory: Negative for cough and shortness of breath.   Cardiovascular: Negative for chest pain and leg swelling.  Gastrointestinal: Negative for nausea, vomiting and diarrhea.  Genitourinary: Negative for urgency, penile swelling, scrotal swelling and testicular pain.       Objective:   Physical Exam Vitals: reviewed Gen: pleasant male, NAD HEENT: normocephalic, PERRL, EOMI, no scleral icterus, bilateral TM's pearly grey, nasal septum midline, MMM, uvula midline, no pharyngeal erythema or exudate noted, no cervical lymphadenopathy, no thyromegaly Cardiac: RRR, S1 and S2 present, no murmurs, no heaves/thrills Resp: CTAB, normal effort Abd: soft, no tenderness, normal bowel sounds, no scars GU: normal male anatomy, small marble sized cystic structure in the left groin adjacent  to the scrotum, no pore noted, no drainage noted, mildly tender Rectal: normal tone, prostate was not enlarged, no masses palpated, prostate was smooth Ext: no edema, 2+ radial and DP pulses Skin: multiple SK on back, 0.5 by 0.5 cm mole on left flank (normal borders, confluent color), no suspicious lesions noted, scar over left shoulder from previous surgery      Assessment & Plan:  Please see problem specific assessment and plan.

## 2014-01-07 NOTE — Patient Instructions (Addendum)
It was nice to meet you today.  You are up to date on your immunizations.  You are due for another Colonoscopy.    You are not due for lab work at this time.   Refills of your medications were sent to the pharmacy.  Check home blood pressures for the next two weeks and call in to Dr. Ree Kida.

## 2014-01-07 NOTE — Assessment & Plan Note (Signed)
Currently stable on Celexa.

## 2014-01-07 NOTE — Assessment & Plan Note (Signed)
Not currently inflamed. -due to the proximity of the lesion to the scrotum would consider referral to Urology if he would like it removed in the future.

## 2014-01-07 NOTE — Assessment & Plan Note (Addendum)
Patient presents for annual physical. -Up to date on immunizations -Needs Colonoscopy (last in 2009) given strong FM colon cancer, will refer to GI.  -Information on POA/Living will provided -Up to date on screening lab work.  -Recommended daily exercise and daily aspirin.

## 2014-01-07 NOTE — Assessment & Plan Note (Signed)
Mildly elevated today. Has not taken dose of HCTZ today. -patient to check BP at work and call in pressures in 2 weeks

## 2014-04-22 ENCOUNTER — Ambulatory Visit: Payer: 59 | Admitting: Family Medicine

## 2014-04-27 ENCOUNTER — Other Ambulatory Visit (HOSPITAL_COMMUNITY): Payer: Self-pay | Admitting: Neurosurgery

## 2014-04-27 DIAGNOSIS — M5416 Radiculopathy, lumbar region: Secondary | ICD-10-CM

## 2014-04-30 ENCOUNTER — Ambulatory Visit (HOSPITAL_BASED_OUTPATIENT_CLINIC_OR_DEPARTMENT_OTHER)
Admission: RE | Admit: 2014-04-30 | Discharge: 2014-04-30 | Disposition: A | Discharge status: Home / Self Care | Payer: 59 | Source: Ambulatory Visit | Attending: Neurosurgery | Admitting: Neurosurgery

## 2014-04-30 DIAGNOSIS — M545 Low back pain, unspecified: Secondary | ICD-10-CM | POA: Diagnosis present

## 2014-04-30 DIAGNOSIS — G8929 Other chronic pain: Secondary | ICD-10-CM | POA: Diagnosis not present

## 2014-04-30 DIAGNOSIS — R209 Unspecified disturbances of skin sensation: Secondary | ICD-10-CM | POA: Diagnosis not present

## 2014-04-30 DIAGNOSIS — M51379 Other intervertebral disc degeneration, lumbosacral region without mention of lumbar back pain or lower extremity pain: Secondary | ICD-10-CM | POA: Insufficient documentation

## 2014-04-30 DIAGNOSIS — M5416 Radiculopathy, lumbar region: Secondary | ICD-10-CM

## 2014-04-30 DIAGNOSIS — M47817 Spondylosis without myelopathy or radiculopathy, lumbosacral region: Secondary | ICD-10-CM | POA: Diagnosis not present

## 2014-04-30 DIAGNOSIS — M5137 Other intervertebral disc degeneration, lumbosacral region: Secondary | ICD-10-CM | POA: Insufficient documentation

## 2014-07-04 ENCOUNTER — Ambulatory Visit (INDEPENDENT_AMBULATORY_CARE_PROVIDER_SITE_OTHER): Payer: 59 | Admitting: Family Medicine

## 2014-07-04 ENCOUNTER — Encounter: Payer: Self-pay | Admitting: Family Medicine

## 2014-07-04 ENCOUNTER — Ambulatory Visit (HOSPITAL_COMMUNITY)
Admission: RE | Admit: 2014-07-04 | Discharge: 2014-07-04 | Disposition: A | Discharge status: Home / Self Care | Payer: 59 | Source: Ambulatory Visit | Attending: Family Medicine | Admitting: Family Medicine

## 2014-07-04 VITALS — BP 142/77 | HR 83 | Temp 98.2°F | Ht 74.0 in | Wt 256.7 lb

## 2014-07-04 DIAGNOSIS — K5900 Constipation, unspecified: Secondary | ICD-10-CM | POA: Insufficient documentation

## 2014-07-04 DIAGNOSIS — K589 Irritable bowel syndrome without diarrhea: Secondary | ICD-10-CM

## 2014-07-04 DIAGNOSIS — K5901 Slow transit constipation: Secondary | ICD-10-CM

## 2014-07-04 MED ORDER — SENNA-DOCUSATE SODIUM 8.6-50 MG PO TABS: 2.0000 | ORAL_TABLET | Freq: Every day | ORAL | Status: DC

## 2014-07-04 MED ORDER — POLYETHYLENE GLYCOL 3350 17 GM/SCOOP PO POWD: 17.0000 g | Freq: Two times a day (BID) | ORAL | Status: DC | PRN

## 2014-07-04 NOTE — Progress Notes (Signed)
Patient ID: Patrick Oconnor, male   DOB: 12-17-1965, 48 y.o.   MRN: [id number]   Subjective:  Patrick Oconnor is a 48 y.o. male presenting to same day clinic for bowel issues.  Complains of 4 days of constipation which is a recurrent prblem for him. Tried senna without stimulant without much success. Abdominal bloating but not really painful. Is passing flatus. Associated nausea while eating and poor appetite. Has been able to drink. Nothing makes it better, eating makes it worse. This is a new problem, though he has self-diagnosed IBS. He is due for colonoscopy but is waiting until January for financial reasons.   - Review of Systems: Per HPI.  - Smoking status noted Objective:  BP 142/77 mmHg  Pulse 83  Temp(Src) 98.2 F (36.8 C) (Oral)  Ht 6\' 2"  (1.88 m)  Wt 256 lb 11.2 oz (116.438 kg)  BMI 32.94 kg/m2 Gen:  48 y.o. male in no distress GI: Hypoactive but definitely present BS; soft, uncomfortable without focal pain on palpation, non-distended, no organomegaly, no hernia appreciated  Assessment/Plan:  Patrick Oconnor is a 48 y.o. male here for constipation.  Problem List Items Addressed This Visit      Digestive   IRRITABLE BOWEL SYNDROME   Relevant Medications      sennosides-docusate sodium (SENOKOT-S) 8.6-50 MG tablet      POLYETHYLENE GLYCOL 3350 17 GM PO POWD   Constipation - Primary    + bowel sounds, passing flatus. Will get abd XR to definitively r/o acute abdomen though this is also to allay his concerns as I suspect this will show proximal bloating without obstruction and significant stool burden.      Relevant Medications      sennosides-docusate sodium (SENOKOT-S) 8.6-50 MG tablet      POLYETHYLENE GLYCOL 3350 17 GM PO POWD   Other Relevant Orders      DG Abd 2 Views (Completed)

## 2014-07-04 NOTE — Assessment & Plan Note (Signed)
+   bowel sounds, passing flatus. Will get abd XR to definitively r/o acute abdomen though this is also to allay his concerns as I suspect this will show proximal bloating without obstruction and significant stool burden.

## 2014-07-04 NOTE — Patient Instructions (Signed)
Take senna as directed until symptoms resolve then increase water and fiber intake and try miralax as needed if you begin getting off schedule.  - Make sure you get the imaging done. We will contact you with these results.

## 2014-07-05 ENCOUNTER — Telehealth: Payer: Self-pay | Admitting: *Deleted

## 2014-07-05 NOTE — Telephone Encounter (Signed)
Pt informed. Patrick Oconnor, Patrick Oconnor  

## 2014-07-05 NOTE — Telephone Encounter (Signed)
-----   Message from Patrecia Pour, MD sent at 07/05/2014  3:31 PM EST ----- No free air or other concerning findings on my read. Large stool burden as expected. Pt needs to call clinic if no resolution in next 24 hours. I attempted to call pt but no answer. Can we call him again later today to inform him of this? Thank you!

## 2014-07-27 ENCOUNTER — Other Ambulatory Visit: Payer: Self-pay | Admitting: Family Medicine

## 2014-09-05 ENCOUNTER — Other Ambulatory Visit: Payer: Self-pay | Admitting: Ophthalmology

## 2014-09-09 ENCOUNTER — Other Ambulatory Visit: Payer: Self-pay | Admitting: Family Medicine

## 2014-09-16 ENCOUNTER — Other Ambulatory Visit: Payer: Self-pay | Admitting: Gastroenterology

## 2014-09-16 LAB — HM COLONOSCOPY

## 2014-12-13 ENCOUNTER — Other Ambulatory Visit: Payer: Self-pay | Admitting: Family Medicine

## 2015-01-19 ENCOUNTER — Encounter: Payer: Self-pay | Admitting: Family Medicine

## 2015-01-19 ENCOUNTER — Ambulatory Visit (INDEPENDENT_AMBULATORY_CARE_PROVIDER_SITE_OTHER): Payer: 59 | Admitting: Family Medicine

## 2015-01-19 VITALS — BP 138/75 | HR 93 | Temp 97.7°F | Ht 74.0 in | Wt 250.5 lb

## 2015-01-19 DIAGNOSIS — J0140 Acute pansinusitis, unspecified: Secondary | ICD-10-CM

## 2015-01-19 MED ORDER — AZITHROMYCIN 250 MG PO TABS: ORAL_TABLET | ORAL | Status: DC

## 2015-01-19 NOTE — Progress Notes (Signed)
   Subjective:    Patient ID: Patrick Oconnor, male    DOB: [birth year], 49 y.o.   MRN: [id number]  HPI: Pt presents to clinic for SDA with concern for sinus infection. He has had multiple sinus infections in the past; he had a similar illness that cleared up on its own about 3 weeks ago. He started having sore throat, cough, and nasal congestion about 2 weeks ago. He has had some dark brown-red sputum (streaks of blood he thinks from "severe throat irritation"). He has had some subjective fevers. He has taken some OTC cold medicine which helped him sleep but not with his other symptoms. He has no chest pain, frank SOB, abdominal pain, N/V, change in bowel / bladder habits.  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  Review of Systems: As above.     Objective:   Physical Exam BP 138/75 mmHg  Pulse 93  Temp(Src) 97.7 F (36.5 C) (Oral)  Ht 6\' 2"  (1.88 m)  Wt 250 lb 8 oz (113.626 kg)  BMI 32.15 kg/m2 Gen: non-toxic-appearing adult male in clear discomfort, audibly very congested HEENT: [place]/AT, EOMI, PERRLA, MMM, TM's clear bilaterally  Nasal mucosae markedly red; moderate amount of thick nasal secretions noted in nasal vault  S/p tonsillectomy but with significant posterior pharyngeal erythema Neck: supple, normal ROM, no lymphadenopathy Cardio: RRR, no murmur appreciated Pulm: CTAB, no wheezes, normal WOB Abd: soft, nontender, BS+ Ext: warm, well-perfused, no LE edema     Assessment & Plan:  49yo male with likely sinusitis (question viral originally now with superimposed bacterial infection) - given duration and severity of symptoms, favor tx with abx - Rx for azithromycin for 5 days with OTC decongestant PRN (Mucinex, Dayquil, etc) - recommended cough drops or throat spray PRN, otherwise - strict return precautions discussed (worse pain, fever, PO intolerance,  frank difficulty breathing, etc) - f/u with PCP Dr. Ree Kida as needed  Of note, pt is due for yearly wellness visit but has had difficulty getting an appt with Dr. Ree Kida - advised scheduling a f/u visit with Dr. Ree Kida at earliest available time for chronic issues, with f/u as SDA's for as-needed issues in the meantime  Emmaline Kluver, MD PGY-3, [facility] 01/19/2015, 7:51 PM

## 2015-01-19 NOTE — Patient Instructions (Signed)
Thank you for coming in, today!  I think most likely you had a virus a few weeks ago that may not have cleared up all the way. I think you might have a bacterial infection on top of it.  I want you to take azithromycin every day for 5 days. Take two pills today, then one pill a day for 4 more days.  Try Mucinex and / or Dayquil over the counter for your other symptoms. Halls drops or similar medicines can help with the cough.  Come back to see Dr. Ree Kida as you need. Please feel free to call with any questions or concerns at any time, at [phone]. --Dr. Venetia Maxon

## 2015-02-20 ENCOUNTER — Encounter: Payer: 59 | Admitting: Family Medicine

## 2015-02-21 ENCOUNTER — Encounter: Payer: Self-pay | Admitting: Family Medicine

## 2015-02-21 ENCOUNTER — Ambulatory Visit (INDEPENDENT_AMBULATORY_CARE_PROVIDER_SITE_OTHER): Payer: 59 | Admitting: Family Medicine

## 2015-02-21 VITALS — BP 143/88 | HR 73 | Temp 98.2°F | Ht 74.0 in | Wt 249.4 lb

## 2015-02-21 DIAGNOSIS — M1990 Unspecified osteoarthritis, unspecified site: Secondary | ICD-10-CM | POA: Diagnosis not present

## 2015-02-21 DIAGNOSIS — I951 Orthostatic hypotension: Secondary | ICD-10-CM

## 2015-02-21 DIAGNOSIS — R5382 Chronic fatigue, unspecified: Secondary | ICD-10-CM

## 2015-02-21 DIAGNOSIS — Z0000 Encounter for general adult medical examination without abnormal findings: Secondary | ICD-10-CM

## 2015-02-21 DIAGNOSIS — Z8042 Family history of malignant neoplasm of prostate: Secondary | ICD-10-CM

## 2015-02-21 DIAGNOSIS — F329 Major depressive disorder, single episode, unspecified: Secondary | ICD-10-CM

## 2015-02-21 DIAGNOSIS — F32A Depression, unspecified: Secondary | ICD-10-CM

## 2015-02-21 DIAGNOSIS — G2581 Restless legs syndrome: Secondary | ICD-10-CM

## 2015-02-21 DIAGNOSIS — J309 Allergic rhinitis, unspecified: Secondary | ICD-10-CM | POA: Diagnosis not present

## 2015-02-21 DIAGNOSIS — I10 Essential (primary) hypertension: Secondary | ICD-10-CM

## 2015-02-21 LAB — BASIC METABOLIC PANEL
BUN: 28 mg/dL — ABNORMAL HIGH (ref 6–23)
CALCIUM: 9.3 mg/dL (ref 8.4–10.5)
CHLORIDE: 102 meq/L (ref 96–112)
CO2: 28 mEq/L (ref 19–32)
Creat: 0.97 mg/dL (ref 0.50–1.35)
GLUCOSE: 94 mg/dL (ref 70–99)
Potassium: 3.9 mEq/L (ref 3.5–5.3)
Sodium: 142 mEq/L (ref 135–145)

## 2015-02-21 LAB — CBC
HEMATOCRIT: 39.3 % (ref 39.0–52.0)
Hemoglobin: 12.6 g/dL — ABNORMAL LOW (ref 13.0–17.0)
MCH: 23.7 pg — ABNORMAL LOW (ref 26.0–34.0)
MCHC: 32.1 g/dL (ref 30.0–36.0)
MCV: 74 fL — AB (ref 78.0–100.0)
MPV: 9.4 fL (ref 8.6–12.4)
Platelets: 258 10*3/uL (ref 150–400)
RBC: 5.31 MIL/uL (ref 4.22–5.81)
RDW: 16 % — ABNORMAL HIGH (ref 11.5–15.5)
WBC: 8.8 10*3/uL (ref 4.0–10.5)

## 2015-02-21 LAB — TSH: TSH: 1.115 u[IU]/mL (ref 0.350–4.500)

## 2015-02-21 MED ORDER — CITALOPRAM HYDROBROMIDE 20 MG PO TABS: 20.0000 mg | ORAL_TABLET | Freq: Every day | ORAL | Status: DC

## 2015-02-21 MED ORDER — FLUTICASONE PROPIONATE 50 MCG/ACT NA SUSP: 2.0000 | Freq: Every day | NASAL | Status: DC

## 2015-02-21 MED ORDER — MELOXICAM 7.5 MG PO TABS: 7.5000 mg | ORAL_TABLET | Freq: Every day | ORAL | Status: DC

## 2015-02-21 NOTE — Assessment & Plan Note (Addendum)
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

## 2015-02-21 NOTE — Progress Notes (Signed)
   Subjective:    Patient ID: Patrick Oconnor, male    DOB: [birth year], 49 y.o.   MRN: [id number]  HPI 49 year old Caucasian male presents for annual wellness visit. Patient also has multiple acute complaints as outlined below.  Restless leg syndrome-patient reports tingling type sensation mostly at night in his lower and upper extremities, he has taken intermittent tramadol which provides minimal relief, he has never been on medication for this symptom, it causes insomnia relating to daily fatigue  Chronic allergies/sinusitis - patient reports intermittent rhinorrhea, nasal congestion, and sore throat. He takes Claritin daily. Was previously on Flonase and would like to retry this medication. Of note he was recently treated for bacterial sinusitis reports improvement of his symptoms.  Arthritis of bilateral hands - patient works as a Community education officer, reports pain in bilateral hands primarily in the MCP joints, he reports minimal relief with daily ibuprofen/Naprosyn/and when necessary tramadol. The arthritis is having a negative effect on his work.  Lightheadedness/presyncope - patient reports these feelings primarily when standing from a squatted or seated position, he reports lightheadedness that last approximately 20-25 seconds, he feels like the room is closing in on him, denies loss of consciousness, no associated chest pain or palpitations, no associated shortness of breath, no associated tinnitus or hearing difficulties. Patient is otherwise asymptomatic.  Depression-patient reports that his symptoms of depression are good today however does report intermittent flares, currently taking Celexa 10 mg daily, reports daily fatigue and insomnia, no homicidal or suicidal thoughts, PHQ9 was completed today and patient had a score of 3 and reported that these symptoms make his daily life "somewhat difficult"  Immunizations-patient is up-to-date  Cancer screening  Colon - Patient underwent  colonoscopy in January 2016, patient reports is unremarkable, completed at [facility]  Prostate - Patient request PSA today, his father had a history of prostate cancer in his 21s, his multiple other family members who have had prostate cancer, he denies current urinary complaints including no nocturia/hesitancy/dysuria/hematuria/dribbling.  Social-patient is planning upcoming vacation to [place], nonsmoker, works as Community education officer   Review of Systems  Constitutional: Negative for fever, chills and fatigue.  Respiratory: Negative for cough and shortness of breath.   Cardiovascular: Negative for chest pain.  Gastrointestinal: Negative for nausea and vomiting.  Musculoskeletal: Positive for back pain and arthralgias. Negative for joint swelling.   Otherwise per history of present illness     Objective:   Physical Exam Vitals: Reviewed Gen.: Pleasant Caucasian male, no acute distress HEENT: Normocephalic, pupils are equal round and reactive to light, extraocular motor intact, no scleral icterus, nasal septum midline, no rhinorrhea, moist mucous members, uvula midline, neck was supple, no anterior posterior cervical lymphadenopathy, no thyromegaly Cardiac: Regular in rhythm, S1 and S2 present, no murmurs, no heaves or thrills Respiratory: Clear to patient bilaterally, normal effort Abdomen: Soft, nontender, bowel sounds present Extremity: No edema, 2+ radial pulses bilaterally MSK: Mild tenderness of bilateral MCP joints, no swelling or joint erythema noted of the hands       Assessment & Plan:  Please see problem specific assessment and plan.

## 2015-02-21 NOTE — Patient Instructions (Signed)
It was great to see you today.  Depression - increase Celexa to 20 mg daily  Allergies - add Flonase daily  Restless LEG - check iron levels, Dr. Ree Kida will call you with your results  Arthritis - start Mobic 7.5 mg daily  Lightheadedness/Orthostatic hypotension - check orthostatics  PSA - check today  Follow up with Dr. Ree Kida in 4 weeks.

## 2015-02-21 NOTE — Assessment & Plan Note (Signed)
Patient continues to battle with intermittent bouts of depression. PHQ9 score of 3 today. Patient does have chronic fatigue which may be related to his depression. -Increase Celexa to 20 mg daily

## 2015-02-21 NOTE — Assessment & Plan Note (Signed)
Patient up-to-date on immunizations and routine cancer screening.

## 2015-02-21 NOTE — Assessment & Plan Note (Signed)
Patient has symptoms consistent with orthostatic hypotension. Orthostatics were negative in office today. -Encouraged adequate by mouth water intake during the day -Check BMP to monitor electrolytes as well as CBC to monitor for anemia -If workup is negative consider switching HCTZ to other antihypertensives medication

## 2015-02-21 NOTE — Assessment & Plan Note (Signed)
Patient has uncontrolled symptoms consistent with allergic rhinitis. -Continue daily Claritin -Add Flonase to daily regimen

## 2015-02-21 NOTE — Assessment & Plan Note (Signed)
Blood pressure mildly elevated today however improved upon repeat during orthostatic blood pressures. -Continue to monitor at subsequent visits

## 2015-02-21 NOTE — Assessment & Plan Note (Signed)
Patient continues to have symptoms consistent with restless leg syndrome. -Check CBC to rule out anemia as the cause -If hemoglobin is normal consider trial of ropinirole

## 2015-02-21 NOTE — Assessment & Plan Note (Signed)
Patient currently denies symptoms of BPH. -Per patient request will check PSA is less than was done greater than 2 years ago

## 2015-02-22 LAB — PSA: PSA: 0.34 ng/mL (ref <=4.00)

## 2015-02-24 ENCOUNTER — Telehealth: Payer: Self-pay | Admitting: Family Medicine

## 2015-02-24 DIAGNOSIS — D509 Iron deficiency anemia, unspecified: Secondary | ICD-10-CM

## 2015-02-24 NOTE — Telephone Encounter (Signed)
Left message to return call to discuss lab results

## 2015-02-27 NOTE — Telephone Encounter (Signed)
Attempted to return call. No answer. Mailbox was full.

## 2015-02-27 NOTE — Telephone Encounter (Signed)
Patient returned call, would like Dr. Ree Kida to call his cell at [phone]. Patient states he will be available at this number all day.

## 2015-02-28 DIAGNOSIS — D509 Iron deficiency anemia, unspecified: Secondary | ICD-10-CM | POA: Insufficient documentation

## 2015-02-28 MED ORDER — FERROUS SULFATE 325 (65 FE) MG PO TABS: 325.0000 mg | ORAL_TABLET | Freq: Every day | ORAL | Status: DC

## 2015-02-28 NOTE — Telephone Encounter (Signed)
Discussed lab results with patient. Specifically, low hemoglobin with low MCV, colonoscopy in past 6 months negative, no active bleeding, check iron studies, start iron supplementation, use Senna-S for constipation

## 2015-02-28 NOTE — Addendum Note (Signed)
Addended by: Lupita Dawn on: 02/28/2015 04:31 PM   Modules accepted: Orders

## 2015-02-28 NOTE — Assessment & Plan Note (Signed)
Low hemoglobin with low MCV. Recent colonoscopy negative at [facility].  -check iron studies -start iron supplementation -suspect low iron contributing to restless legs and orthostatic hypotension

## 2015-03-01 ENCOUNTER — Other Ambulatory Visit: Payer: 59

## 2015-03-01 DIAGNOSIS — D509 Iron deficiency anemia, unspecified: Secondary | ICD-10-CM

## 2015-03-01 LAB — IRON AND TIBC
%SAT: 7 % — AB (ref 20–55)
Iron: 30 ug/dL — ABNORMAL LOW (ref 42–165)
TIBC: 440 ug/dL — AB (ref 215–435)
UIBC: 410 ug/dL — ABNORMAL HIGH (ref 125–400)

## 2015-03-01 LAB — FERRITIN: Ferritin: 7 ng/mL — ABNORMAL LOW (ref 22–322)

## 2015-03-01 NOTE — Progress Notes (Signed)
IRON, TIBC AND FERRITN DONE TODAY Patrick Oconnor

## 2015-03-02 ENCOUNTER — Telehealth: Payer: Self-pay | Admitting: Family Medicine

## 2015-03-02 DIAGNOSIS — G2581 Restless legs syndrome: Secondary | ICD-10-CM

## 2015-03-02 MED ORDER — ROPINIROLE HCL 0.25 MG PO TABS: 0.2500 mg | ORAL_TABLET | Freq: Every day | ORAL | Status: DC

## 2015-03-02 NOTE — Telephone Encounter (Signed)
Discussed iron studies suggestive of iron deficiency anemia. Patient reports no bleeding. Has not picked up iron yet.   Counseled patient to start iron and make a follow up appointment with GI at [facility] as my need EGD to evaluate for GI bleed.  Restless legs - patient to start iron, will also call in ropinirole.

## 2015-03-27 ENCOUNTER — Ambulatory Visit (INDEPENDENT_AMBULATORY_CARE_PROVIDER_SITE_OTHER): Payer: 59 | Admitting: Family Medicine

## 2015-03-27 ENCOUNTER — Other Ambulatory Visit: Payer: Self-pay | Admitting: Family Medicine

## 2015-03-27 ENCOUNTER — Encounter: Payer: Self-pay | Admitting: Family Medicine

## 2015-03-27 VITALS — BP 128/77 | HR 68 | Temp 98.2°F | Wt 253.0 lb

## 2015-03-27 DIAGNOSIS — D509 Iron deficiency anemia, unspecified: Secondary | ICD-10-CM | POA: Diagnosis not present

## 2015-03-27 DIAGNOSIS — G4730 Sleep apnea, unspecified: Secondary | ICD-10-CM | POA: Diagnosis not present

## 2015-03-27 DIAGNOSIS — F329 Major depressive disorder, single episode, unspecified: Secondary | ICD-10-CM

## 2015-03-27 DIAGNOSIS — J309 Allergic rhinitis, unspecified: Secondary | ICD-10-CM | POA: Diagnosis not present

## 2015-03-27 DIAGNOSIS — R5382 Chronic fatigue, unspecified: Secondary | ICD-10-CM

## 2015-03-27 DIAGNOSIS — F32A Depression, unspecified: Secondary | ICD-10-CM

## 2015-03-27 DIAGNOSIS — M1990 Unspecified osteoarthritis, unspecified site: Secondary | ICD-10-CM

## 2015-03-27 DIAGNOSIS — G2581 Restless legs syndrome: Secondary | ICD-10-CM | POA: Diagnosis not present

## 2015-03-27 MED ORDER — MELOXICAM 15 MG PO TABS: 15.0000 mg | ORAL_TABLET | Freq: Every day | ORAL | Status: DC

## 2015-03-27 NOTE — Assessment & Plan Note (Signed)
Improved with iron and ropinirole -Patient may increase ropinirole 0.25 mg to twice nightly as needed

## 2015-03-27 NOTE — Patient Instructions (Signed)
It was nice to see you.  Arthritis - increase Mobic to 15 mg daily  Mood - take Celexa 20 mg daily  Restless leg syndromes - may take 2 ropinirole as needed  Please call Dr. Oletta Lamas [facility]) to see if you need an upper GI scope.

## 2015-03-27 NOTE — Assessment & Plan Note (Signed)
Patient has chronic fatigue which is likely multifactorial from OSA, depression, and restless leg syndrome. -see individual problems for specific plans

## 2015-03-27 NOTE — Assessment & Plan Note (Signed)
Patient has known OSA which is likely contributing to chronic fatigue. Patient is resistant to using CPAP machine nightly -Encourage nightly use of CPAP machine

## 2015-03-27 NOTE — Progress Notes (Signed)
   Subjective:    Patient ID: Patrick Oconnor, male    DOB: [birth year], 49 y.o.   MRN: [id number]  HPI 49 year old male presented for follow-up of multiple medical issues.  Restless leg syndrome - patient recently started on ferrous sulfate 325 mg by mouth daily and ropinirole 0.25 mg nightly, patient states that he has seen some improvement of his restless legs however he does not fully make it through the night  Anemia - consistent with iron deficiency anemia, patient recently started on iron sulfate, he has noted dark stools since starting this medication, he did recently stop this medication for a few days and noted resolution of the dark stools, he had a colonoscopy in January 2016 which was negative, he is planning to follow-up with his GI physician for possible EGD  Arthritis - patient reports some improvement in the arthritis of his hands with Modic 7.5 mg daily, however he is continuing to take Naprosyn and ibuprofen at nighttime  Decreased mood - patient recently increased Celexa 20 mg daily, he has noted that his symptoms are well controlled if he does not have any stressors, denies homicidal or suicidal thoughts, does report decreased concentration, no insomnia  Fatigue - occurs daily, patient thinks that it may be related to his sleep apnea however he is unable to tolerate CPAP machine  Acid reflux - controlled with omeprazole, patient does report heartburn symptoms if he stops this medication for a few days, no nausea vomiting, no early satiety  Social-works as a physical therapist at [employer]   Review of Systems  Constitutional: Negative for fever, chills and fatigue.  Respiratory: Negative for cough and shortness of breath.   Cardiovascular: Negative for chest pain and leg swelling.  Gastrointestinal: Negative for nausea, vomiting and diarrhea.       Objective:   Physical Exam Vitals: Reviewed Gen.: Pleasant male, no acute distress Cardiac: Regular rate and  rhythm, S1-S2 present, no murmurs Respiratory: Clear to auscultation bilaterally, normal effort Extremities: No edema Psych: Appropriate dressed, affect is outgoing, mood is described as okay, no tangential thoughts, does not appear to be responding to internal stimuli, no homicidal or suicidal thoughts  Reviewed recent anemia workup    Assessment & Plan:  Please see problem specific assessment and plan.

## 2015-03-27 NOTE — Assessment & Plan Note (Addendum)
Depression symptoms are stable on Celexa 20 mg daily. -Continue current treatment -Celexa may also cause peptic ulcers, if EGD positive for PUD may consider changing to other therapies

## 2015-03-27 NOTE — Assessment & Plan Note (Addendum)
Recent lab work consistent with iron deficiency anemia. Patient currently on ferrous sulfate 325 mg daily. -Colonoscopy in January 2016 was unremarkable -Patient planning to schedule follow-up appointment with GI for possible EGD -Could consider fecal occult blood however would likely be falsely positive due to iron use -Chronic NSAID use may be leading to gastric ulcers, if EGD is positive would consider stopping NSAIDs

## 2015-03-27 NOTE — Assessment & Plan Note (Signed)
Patient states arthritis is mild improved on Mobic 7.5 mg daily -Will increase to 15 mg daily -Encourage patient to stop using Naprosyn and ibuprofen when necessary -Chronic NSAID use may be contributing to iron deficiency anemia, patient already on PPI, consider stopping NSAID if EGD positive for gastric ulcers

## 2015-04-03 ENCOUNTER — Other Ambulatory Visit: Payer: Self-pay | Admitting: *Deleted

## 2015-04-03 MED ORDER — HYDROCHLOROTHIAZIDE 25 MG PO TABS: 25.0000 mg | ORAL_TABLET | Freq: Every day | ORAL | Status: DC

## 2015-04-27 ENCOUNTER — Ambulatory Visit (INDEPENDENT_AMBULATORY_CARE_PROVIDER_SITE_OTHER): Payer: 59 | Admitting: Family Medicine

## 2015-04-27 ENCOUNTER — Encounter: Payer: Self-pay | Admitting: Family Medicine

## 2015-04-27 VITALS — BP 140/95 | HR 69 | Temp 98.3°F | Ht 74.0 in | Wt 254.0 lb

## 2015-04-27 DIAGNOSIS — G44219 Episodic tension-type headache, not intractable: Secondary | ICD-10-CM

## 2015-04-27 DIAGNOSIS — D509 Iron deficiency anemia, unspecified: Secondary | ICD-10-CM

## 2015-04-27 DIAGNOSIS — Z0000 Encounter for general adult medical examination without abnormal findings: Secondary | ICD-10-CM | POA: Diagnosis not present

## 2015-04-27 DIAGNOSIS — M1990 Unspecified osteoarthritis, unspecified site: Secondary | ICD-10-CM | POA: Diagnosis not present

## 2015-04-27 DIAGNOSIS — F32A Depression, unspecified: Secondary | ICD-10-CM

## 2015-04-27 DIAGNOSIS — F329 Major depressive disorder, single episode, unspecified: Secondary | ICD-10-CM

## 2015-04-27 DIAGNOSIS — R519 Headache, unspecified: Secondary | ICD-10-CM | POA: Insufficient documentation

## 2015-04-27 DIAGNOSIS — R51 Headache: Secondary | ICD-10-CM

## 2015-04-27 DIAGNOSIS — J309 Allergic rhinitis, unspecified: Secondary | ICD-10-CM | POA: Diagnosis not present

## 2015-04-27 LAB — CBC
HEMATOCRIT: 44 % (ref 39.0–52.0)
Hemoglobin: 14.6 g/dL (ref 13.0–17.0)
MCH: 26.4 pg (ref 26.0–34.0)
MCHC: 33.2 g/dL (ref 30.0–36.0)
MCV: 79.7 fL (ref 78.0–100.0)
MPV: 9.7 fL (ref 8.6–12.4)
PLATELETS: 202 10*3/uL (ref 150–400)
RBC: 5.52 MIL/uL (ref 4.22–5.81)
RDW: 18.3 % — AB (ref 11.5–15.5)
WBC: 6.1 10*3/uL (ref 4.0–10.5)

## 2015-04-27 LAB — IRON AND TIBC
%SAT: 19 % (ref 15–60)
IRON: 75 ug/dL (ref 50–180)
TIBC: 385 ug/dL (ref 250–425)
UIBC: 310 ug/dL (ref 125–400)

## 2015-04-27 LAB — LIPID PANEL
CHOL/HDL RATIO: 6.5 ratio — AB (ref <=5.0)
CHOLESTEROL: 157 mg/dL (ref 125–200)
HDL: 24 mg/dL — ABNORMAL LOW (ref >=40)
LDL CALC: 82 mg/dL (ref <130)
TRIGLYCERIDES: 254 mg/dL — AB (ref <150)
VLDL: 51 mg/dL — AB (ref <30)

## 2015-04-27 LAB — FERRITIN: Ferritin: 31 ng/mL (ref 22–322)

## 2015-04-27 MED ORDER — CITALOPRAM HYDROBROMIDE 20 MG PO TABS: 20.0000 mg | ORAL_TABLET | Freq: Every day | ORAL | Status: DC

## 2015-04-27 MED ORDER — MELOXICAM 15 MG PO TABS: 15.0000 mg | ORAL_TABLET | Freq: Every day | ORAL | Status: DC

## 2015-04-27 NOTE — Assessment & Plan Note (Signed)
Controlled with Mobic. Continue current dose of 15 mg daily (patient to not take with other NSAID's other than asa)

## 2015-04-27 NOTE — Progress Notes (Signed)
Subjective:     Patient ID: Patrick Oconnor, male   DOB: 05-10-1966, 49 y.o.   MRN: [id number]  H&P performed and written by medical student Kara Dies Pisanie MS3 under supervision from Dr. Ree Kida   HPI  HEADACHE: Patient complains of constant, not worsening headache for the past 7-8 days improved by ibuprofen. He has had headaches like this before infrequently in the past. It is associated with tightness in his neck and shoulders. The headache is all around his head like a band. He is taking Ibuprofen which helps reduce the pain. The headache is associated with the onset of his allergies: runny nose, stuffiness and water eyes. He is taking Flonase everyday as well as Claritin which have helped a little. He is still taking his mobic but only the ibuprofen helps for the headache. He denies any night time awakenings by headahce, blurry vision, fevers, night sweats, weight loss, dysphagia, trauma, dysarthria, sensory changes/ issues, weakness, dizziness, incoordination or balance issues. No history of migraines, no associated neurologic symptoms  RESTLESS LEG / IRON DEFICIENCY ANEMIA: Patient says his symptoms are doing much better. No episodes for 8-10 days. Had to take ropinirole last night because he felt it coming on. Takes ferrous sulfate every day with no side effects. Patient has not contacted Dr. Oletta Lamas for assessment for GI blood loss due to life getting in the way. He says that he is going to contact him this time. No blood in stool, no acid reflux symtpoms  ARTHRITIS: Symptoms are well controlled on the increased dose of mobic.   OSA: Patient is having trouble falling asleep with the CPAP and high flow nasal canula masks on so he has not been using them at all. He has not tried a sleep aid. He is sleeping fine and is getting around 7 hrs + a night.   DEPRESSION: Patient says he is feeling better and that his medication is helping him. No thoughts of suicide.  Review of  Systems Pertinent ROS as per HPI    Objective:   Physical Exam Vitals: reviewed GEN: NAD, well appearing HEENT: PERRL, EOMI, neck supple, no thyroid nodules / megaly, no lymphadenopathy.  CV: RRR, nl S1 S2, no m/r/g PULM: CTAB, NWOB no w/c/r GI: S/NT/ND no organomegaly NEURO: AOx3, nl ROM, CN 2-12 intact, strength grossly intact, gait normal MSK: muscle spasm of bilateral cervical paraspinal muscles and trapezius muscles     Assessment and Plan     HEADACHES: Likely multifactorial from allergies, OSA and stress at work leading to tension type headache. - advised patient to substitute ibuprofen for acetaminophen along with mobic - if this doesn't help take ibuprofen only and stop mobic - Advised patient that is Sx gets worse and is associated with any neurological defects to contact us  RESTLESS LEG / IRON DEF ANEMIA: Restless leg improving but unknown cause of Iron def anemia.  - Recheck CBC, Ferritin and Iron TIBC.  - Continue ferrous sulfate  - Monitor Sx - Patient confirmed that he will contact GI to eval for blood loss  ARTHRITIS: Controlled and stable  - wrote 3 month prescription for mobic  OSA: Uncontrolled due to patient nonadherence.  - Advised patient to take melatonin 30 mins before bed for a few nights to help him sleep, then to try introducing the CPAP/ high flow nasal canula with the melatonin  DEPRESSION: Improving  - continue current dose of citalopram  - Wrote 3 month prescription  HEALTH MAINTENANCE  - Flu shot  to be given at the hospital  - Lipid panel last was taken in  02/2013 (L HDL 33, H LDL 106)  Luciana Cammarata du Pisanie MS3  I personally saw and evaluated the patient. I have reviewed the medical student note and agree with the documentation. I personally completed the physical exam. Additions to the medical student note are made in blue.   Dossie Arbour MD

## 2015-04-27 NOTE — Assessment & Plan Note (Signed)
Due for check of CBC and iron studies. -encouraged patient to follow up with GI to further evaluate source of iron deficiency anemia -continue daily iron supplementation

## 2015-04-27 NOTE — Assessment & Plan Note (Signed)
Symptoms stable -continue Celexa 20 mg daily

## 2015-04-27 NOTE — Patient Instructions (Signed)
Thank you for coming in today!  For your headache, try taking tylenol with the mobic. If this doesn't work, take the ibuprofen alone without mobic.  If there are any vision changes weakness or night time awakenings with your headache come back and see Patrick Oconnor.  We are checking your Hb and your ferritin  CALL DR EDWARDS We will get your mobic and citalopram on 3 mos scheduled We are checking lipid levels today, will call with results if abnml.

## 2015-04-27 NOTE — Assessment & Plan Note (Signed)
Tension type headache. No associated neurologic or red flag symptoms. -continue Mobic daily (patient counseled to not use with other NSAIDS, if he uses ibuprofen he is not to take Mobic) -add prn Tylenol -apply heat prn and continue ROM exercises of shoulders and neck -return precautions given (paitent at risk for cerebral bleed when taking asa + multiple NSAID's), clinically do not suspect SDH given history and exam findings

## 2015-04-28 ENCOUNTER — Encounter: Payer: Self-pay | Admitting: Family Medicine

## 2015-08-25 DIAGNOSIS — M7702 Medial epicondylitis, left elbow: Secondary | ICD-10-CM | POA: Diagnosis not present

## 2015-08-25 DIAGNOSIS — S4351XA Sprain of right acromioclavicular joint, initial encounter: Secondary | ICD-10-CM | POA: Diagnosis not present

## 2015-08-29 DIAGNOSIS — H5201 Hypermetropia, right eye: Secondary | ICD-10-CM | POA: Diagnosis not present

## 2015-08-29 DIAGNOSIS — H52223 Regular astigmatism, bilateral: Secondary | ICD-10-CM | POA: Diagnosis not present

## 2015-09-14 ENCOUNTER — Other Ambulatory Visit: Payer: Self-pay | Admitting: Family Medicine

## 2015-09-14 MED FILL — rOPINIRole HCL 0.25 MG TABS: 0.25 | 30 days supply | Qty: 30 | Fill #2

## 2015-09-14 MED FILL — OMEPRAZOLE DR 20 MG CAPSULE: 20 | 90 days supply | Qty: 90 | Fill #2

## 2015-09-14 MED FILL — HYDROCHLOROTHIAZIDE 25 MG T: 25 | 90 days supply | Qty: 90 | Fill #0

## 2015-09-27 DIAGNOSIS — M7581 Other shoulder lesions, right shoulder: Secondary | ICD-10-CM | POA: Diagnosis not present

## 2015-09-27 DIAGNOSIS — S43101A Unspecified dislocation of right acromioclavicular joint, initial encounter: Secondary | ICD-10-CM | POA: Diagnosis not present

## 2015-09-27 DIAGNOSIS — M7551 Bursitis of right shoulder: Secondary | ICD-10-CM | POA: Diagnosis not present

## 2015-09-27 DIAGNOSIS — Y92838 Other recreation area as the place of occurrence of the external cause: Secondary | ICD-10-CM | POA: Diagnosis not present

## 2015-09-27 DIAGNOSIS — S4380XA Sprain of other specified parts of unspecified shoulder girdle, initial encounter: Secondary | ICD-10-CM | POA: Diagnosis not present

## 2015-09-27 DIAGNOSIS — S4351XA Sprain of right acromioclavicular joint, initial encounter: Secondary | ICD-10-CM | POA: Diagnosis not present

## 2015-09-27 DIAGNOSIS — Y9323 Activity, snow (alpine) (downhill) skiing, snow boarding, sledding, tobogganing and snow tubing: Secondary | ICD-10-CM | POA: Diagnosis not present

## 2015-10-02 DIAGNOSIS — S46911A Strain of unspecified muscle, fascia and tendon at shoulder and upper arm level, right arm, initial encounter: Secondary | ICD-10-CM | POA: Diagnosis not present

## 2015-10-04 ENCOUNTER — Other Ambulatory Visit (HOSPITAL_COMMUNITY): Payer: Self-pay | Admitting: Orthopedic Surgery

## 2015-10-04 ENCOUNTER — Other Ambulatory Visit: Payer: Self-pay | Admitting: Orthopedic Surgery

## 2015-10-04 DIAGNOSIS — S46911A Strain of unspecified muscle, fascia and tendon at shoulder and upper arm level, right arm, initial encounter: Secondary | ICD-10-CM

## 2015-10-06 ENCOUNTER — Ambulatory Visit (HOSPITAL_COMMUNITY)
Admission: RE | Admit: 2015-10-06 | Discharge: 2015-10-06 | Disposition: A | Discharge status: Home / Self Care | Payer: 59 | Source: Ambulatory Visit | Attending: Orthopedic Surgery | Admitting: Orthopedic Surgery

## 2015-10-06 DIAGNOSIS — X58XXXA Exposure to other specified factors, initial encounter: Secondary | ICD-10-CM | POA: Insufficient documentation

## 2015-10-06 DIAGNOSIS — S46911A Strain of unspecified muscle, fascia and tendon at shoulder and upper arm level, right arm, initial encounter: Secondary | ICD-10-CM | POA: Diagnosis present

## 2015-10-06 DIAGNOSIS — M25511 Pain in right shoulder: Secondary | ICD-10-CM | POA: Diagnosis not present

## 2015-10-06 DIAGNOSIS — S46811A Strain of other muscles, fascia and tendons at shoulder and upper arm level, right arm, initial encounter: Secondary | ICD-10-CM | POA: Insufficient documentation

## 2015-10-11 DIAGNOSIS — S46011D Strain of muscle(s) and tendon(s) of the rotator cuff of right shoulder, subsequent encounter: Secondary | ICD-10-CM | POA: Diagnosis not present

## 2015-10-18 ENCOUNTER — Other Ambulatory Visit: Payer: Self-pay

## 2015-10-18 ENCOUNTER — Encounter (HOSPITAL_COMMUNITY)
Admission: RE | Admit: 2015-10-18 | Discharge: 2015-10-18 | Disposition: A | Discharge status: Home / Self Care | Payer: 59 | Source: Ambulatory Visit | Attending: Orthopedic Surgery | Admitting: Orthopedic Surgery

## 2015-10-18 ENCOUNTER — Encounter (HOSPITAL_COMMUNITY): Payer: Self-pay

## 2015-10-18 DIAGNOSIS — M19011 Primary osteoarthritis, right shoulder: Secondary | ICD-10-CM | POA: Diagnosis not present

## 2015-10-18 DIAGNOSIS — I10 Essential (primary) hypertension: Secondary | ICD-10-CM | POA: Diagnosis not present

## 2015-10-18 DIAGNOSIS — G4730 Sleep apnea, unspecified: Secondary | ICD-10-CM | POA: Diagnosis not present

## 2015-10-18 DIAGNOSIS — E669 Obesity, unspecified: Secondary | ICD-10-CM | POA: Diagnosis not present

## 2015-10-18 DIAGNOSIS — M75101 Unspecified rotator cuff tear or rupture of right shoulder, not specified as traumatic: Secondary | ICD-10-CM | POA: Diagnosis not present

## 2015-10-18 DIAGNOSIS — M1990 Unspecified osteoarthritis, unspecified site: Secondary | ICD-10-CM | POA: Diagnosis not present

## 2015-10-18 DIAGNOSIS — Z7982 Long term (current) use of aspirin: Secondary | ICD-10-CM | POA: Diagnosis not present

## 2015-10-18 HISTORY — DX: Personal history of other diseases of the respiratory system: Z87.09

## 2015-10-18 HISTORY — DX: Other intervertebral disc degeneration, lumbar region: M51.36

## 2015-10-18 HISTORY — DX: Anemia, unspecified: D64.9

## 2015-10-18 HISTORY — DX: Other intervertebral disc degeneration, lumbar region without mention of lumbar back pain or lower extremity pain: M51.369

## 2015-10-18 LAB — COMPREHENSIVE METABOLIC PANEL
ALBUMIN: 4.1 g/dL (ref 3.5–5.0)
ALK PHOS: 82 U/L (ref 38–126)
ALT: 47 U/L (ref 17–63)
ANION GAP: 12 (ref 5–15)
AST: 36 U/L (ref 15–41)
BILIRUBIN TOTAL: 0.7 mg/dL (ref 0.3–1.2)
BUN: 13 mg/dL (ref 6–20)
CALCIUM: 9.2 mg/dL (ref 8.9–10.3)
CO2: 26 mmol/L (ref 22–32)
CREATININE: 0.94 mg/dL (ref 0.61–1.24)
Chloride: 102 mmol/L (ref 101–111)
GFR calc Af Amer: >60 mL/min (ref >60)
GFR calc non Af Amer: >60 mL/min (ref >60)
GLUCOSE: 131 mg/dL — AB (ref 65–99)
Potassium: 3.7 mmol/L (ref 3.5–5.1)
SODIUM: 140 mmol/L (ref 135–145)
TOTAL PROTEIN: 7 g/dL (ref 6.5–8.1)

## 2015-10-18 LAB — CBC WITH DIFFERENTIAL/PLATELET
BASOS PCT: 1 %
Basophils Absolute: 0 10*3/uL (ref 0.0–0.1)
Eosinophils Absolute: 0.3 10*3/uL (ref 0.0–0.7)
Eosinophils Relative: 5 %
HEMATOCRIT: 44.4 % (ref 39.0–52.0)
HEMOGLOBIN: 15.2 g/dL (ref 13.0–17.0)
Lymphocytes Relative: 26 %
Lymphs Abs: 1.6 10*3/uL (ref 0.7–4.0)
MCH: 29.1 pg (ref 26.0–34.0)
MCHC: 34.2 g/dL (ref 30.0–36.0)
MCV: 84.9 fL (ref 78.0–100.0)
Monocytes Absolute: 0.3 10*3/uL (ref 0.1–1.0)
Monocytes Relative: 6 %
NEUTROS ABS: 3.8 10*3/uL (ref 1.7–7.7)
NEUTROS PCT: 62 %
Platelets: 176 10*3/uL (ref 150–400)
RBC: 5.23 MIL/uL (ref 4.22–5.81)
RDW: 12.8 % (ref 11.5–15.5)
WBC: 6 10*3/uL (ref 4.0–10.5)

## 2015-10-18 LAB — APTT: aPTT: 30 seconds (ref 24–37)

## 2015-10-18 LAB — PROTIME-INR
INR: 1.06 (ref 0.00–1.49)
Prothrombin Time: 14 seconds (ref 11.6–15.2)

## 2015-10-18 MED ORDER — LACTATED RINGERS IV SOLN
INTRAVENOUS | Status: DC
  Administered 2015-10-19: 20 mL/h via INTRAVENOUS

## 2015-10-18 MED ORDER — CEFAZOLIN SODIUM-DEXTROSE 2-3 GM-% IV SOLR
2.0000 g | INTRAVENOUS | Status: AC
  Administered 2015-10-19: 2 g via INTRAVENOUS
  Filled 2015-10-18: qty 50

## 2015-10-18 NOTE — Progress Notes (Signed)
PCP - Dr. Dossie Arbour Cardiologist - denies  EKG- 10/18/15 CXR - denies  Echo/stress test/cardiac cath - denies  Patient denies chest pain and shortness of breath at PAT appointment.    Patient states that he had a sleep study approximately 10 years ago and was diagnosed with sleep apnea but does not wear CPAP.

## 2015-10-18 NOTE — Pre-Procedure Instructions (Signed)
    Herson Perry August  10/18/2015      [facility] - [place], [place] - [address] [place] [address] Phone: [phone] Fax: [phone]    Your procedure is scheduled on Thursday, March 9th, 2017.  Report to [facility] Admitting at 11:15 A.M.  Call this number if you have problems the morning of surgery:  [phone]   Remember:  Do not eat food or drink liquids after midnight.   Take these medicines the morning of surgery with A SIP OF WATER: Albuterol Inhaler (if needed), Citalopram (Celexa), Fluticasone (Flonase) if needed, Loratadine (Claritin), Omeprazole (Prilosec), Ondansetron (Zofran), Visine if needed.   Stop taking: Aspirin, NSAIDS, Aleve, Naproxen, Ibuprofen, Advil, Motrin, BC's, Goody's, Fish oil, all herbal medications, and all vitamins.    Do not wear jewelry.  Do not wear lotions, powders, or colognes.  You may NOT wear deodorant.  Men may shave face and neck.  Do not bring valuables to the hospital.  [facility] is not responsible for any belongings or valuables.  Contacts, dentures or bridgework may not be worn into surgery.  Leave your suitcase in the car.  After surgery it may be brought to your room.  For patients admitted to the hospital, discharge time will be determined by your treatment team.  Patients discharged the day of surgery will not be allowed to drive home.   Special instructions:  See attached.   Please read over the following fact sheets that you were given. Pain Booklet, Coughing and Deep Breathing and Surgical Site Infection Prevention

## 2015-10-19 ENCOUNTER — Ambulatory Visit (HOSPITAL_COMMUNITY)
Admission: RE | Admit: 2015-10-19 | Discharge: 2015-10-19 | Disposition: A | Discharge status: Home / Self Care | Payer: 59 | Source: Ambulatory Visit | Attending: Orthopedic Surgery | Admitting: Orthopedic Surgery

## 2015-10-19 ENCOUNTER — Ambulatory Visit (HOSPITAL_COMMUNITY): Payer: 59 | Admitting: Anesthesiology

## 2015-10-19 ENCOUNTER — Ambulatory Visit (HOSPITAL_COMMUNITY): Payer: 59

## 2015-10-19 ENCOUNTER — Encounter (HOSPITAL_COMMUNITY): Payer: Self-pay | Admitting: Anesthesiology

## 2015-10-19 ENCOUNTER — Encounter (HOSPITAL_COMMUNITY)
Admission: RE | Disposition: A | Discharge status: Home / Self Care | Payer: Self-pay | Source: Ambulatory Visit | Attending: Orthopedic Surgery

## 2015-10-19 DIAGNOSIS — M1990 Unspecified osteoarthritis, unspecified site: Secondary | ICD-10-CM | POA: Diagnosis not present

## 2015-10-19 DIAGNOSIS — Z9889 Other specified postprocedural states: Secondary | ICD-10-CM | POA: Diagnosis not present

## 2015-10-19 DIAGNOSIS — R600 Localized edema: Secondary | ICD-10-CM | POA: Diagnosis not present

## 2015-10-19 DIAGNOSIS — G8918 Other acute postprocedural pain: Secondary | ICD-10-CM | POA: Diagnosis not present

## 2015-10-19 DIAGNOSIS — Z7982 Long term (current) use of aspirin: Secondary | ICD-10-CM | POA: Diagnosis not present

## 2015-10-19 DIAGNOSIS — S46011D Strain of muscle(s) and tendon(s) of the rotator cuff of right shoulder, subsequent encounter: Secondary | ICD-10-CM | POA: Diagnosis not present

## 2015-10-19 DIAGNOSIS — M75101 Unspecified rotator cuff tear or rupture of right shoulder, not specified as traumatic: Secondary | ICD-10-CM | POA: Diagnosis not present

## 2015-10-19 DIAGNOSIS — I10 Essential (primary) hypertension: Secondary | ICD-10-CM | POA: Insufficient documentation

## 2015-10-19 DIAGNOSIS — G4730 Sleep apnea, unspecified: Secondary | ICD-10-CM | POA: Insufficient documentation

## 2015-10-19 DIAGNOSIS — E669 Obesity, unspecified: Secondary | ICD-10-CM | POA: Diagnosis not present

## 2015-10-19 DIAGNOSIS — S43151D Posterior dislocation of right acromioclavicular joint, subsequent encounter: Secondary | ICD-10-CM | POA: Diagnosis not present

## 2015-10-19 DIAGNOSIS — M19011 Primary osteoarthritis, right shoulder: Secondary | ICD-10-CM | POA: Diagnosis not present

## 2015-10-19 DIAGNOSIS — M7521 Bicipital tendinitis, right shoulder: Secondary | ICD-10-CM | POA: Diagnosis not present

## 2015-10-19 DIAGNOSIS — S46011A Strain of muscle(s) and tendon(s) of the rotator cuff of right shoulder, initial encounter: Secondary | ICD-10-CM | POA: Diagnosis not present

## 2015-10-19 DIAGNOSIS — M25311 Other instability, right shoulder: Secondary | ICD-10-CM | POA: Diagnosis not present

## 2015-10-19 DIAGNOSIS — Z419 Encounter for procedure for purposes other than remedying health state, unspecified: Secondary | ICD-10-CM

## 2015-10-19 DIAGNOSIS — M25511 Pain in right shoulder: Secondary | ICD-10-CM | POA: Diagnosis not present

## 2015-10-19 DIAGNOSIS — M75121 Complete rotator cuff tear or rupture of right shoulder, not specified as traumatic: Secondary | ICD-10-CM | POA: Diagnosis not present

## 2015-10-19 HISTORY — PX: SHOULDER OPEN ROTATOR CUFF REPAIR: SHX2407

## 2015-10-19 HISTORY — PX: ACROMIO-CLAVICULAR JOINT REPAIR: SHX5183

## 2015-10-19 SURGERY — REPAIR, ROTATOR CUFF, OPEN
Anesthesia: General | Site: Shoulder | Laterality: Right

## 2015-10-19 MED ORDER — GLYCOPYRROLATE 0.2 MG/ML IJ SOLN
INTRAMUSCULAR | Status: AC
  Filled 2015-10-19: qty 2

## 2015-10-19 MED ORDER — ARTIFICIAL TEARS OP OINT
TOPICAL_OINTMENT | OPHTHALMIC | Status: DC | PRN
  Administered 2015-10-19: 1 via OPHTHALMIC

## 2015-10-19 MED ORDER — PROMETHAZINE HCL 25 MG/ML IJ SOLN: 6.2500 mg | INTRAMUSCULAR | Status: DC | PRN

## 2015-10-19 MED ORDER — HYDROCODONE-ACETAMINOPHEN 7.5-325 MG PO TABS: 1.0000 | ORAL_TABLET | Freq: Once | ORAL | Status: DC | PRN

## 2015-10-19 MED ORDER — MIDAZOLAM HCL 2 MG/2ML IJ SOLN
INTRAMUSCULAR | Status: AC
  Administered 2015-10-19: 2 mg
  Filled 2015-10-19: qty 2

## 2015-10-19 MED ORDER — MEPERIDINE HCL 25 MG/ML IJ SOLN: 6.2500 mg | INTRAMUSCULAR | Status: DC | PRN

## 2015-10-19 MED ORDER — LIDOCAINE HCL (CARDIAC) 20 MG/ML IV SOLN
INTRAVENOUS | Status: AC
  Filled 2015-10-19: qty 10

## 2015-10-19 MED ORDER — EPHEDRINE SULFATE 50 MG/ML IJ SOLN
INTRAMUSCULAR | Status: AC
  Filled 2015-10-19: qty 1

## 2015-10-19 MED ORDER — PHENYLEPHRINE HCL 10 MG/ML IJ SOLN
10.0000 mg | INTRAMUSCULAR | Status: DC | PRN
  Administered 2015-10-19: 10 ug/min via INTRAVENOUS
  Administered 2015-10-19: 50 ug/min via INTRAVENOUS

## 2015-10-19 MED ORDER — CHLORHEXIDINE GLUCONATE 4 % EX LIQD: 60.0000 mL | Freq: Once | CUTANEOUS | Status: DC

## 2015-10-19 MED ORDER — ONDANSETRON HCL 4 MG PO TABS: 4.0000 mg | ORAL_TABLET | Freq: Three times a day (TID) | ORAL | Status: DC | PRN

## 2015-10-19 MED ORDER — NEOSTIGMINE METHYLSULFATE 10 MG/10ML IV SOLN
INTRAVENOUS | Status: AC
  Filled 2015-10-19: qty 1

## 2015-10-19 MED ORDER — DEXAMETHASONE SODIUM PHOSPHATE 4 MG/ML IJ SOLN
INTRAMUSCULAR | Status: DC | PRN
  Administered 2015-10-19: 8 mg via INTRAVENOUS

## 2015-10-19 MED ORDER — ONDANSETRON HCL 4 MG/2ML IJ SOLN
INTRAMUSCULAR | Status: AC
  Filled 2015-10-19: qty 2

## 2015-10-19 MED ORDER — FENTANYL CITRATE (PF) 250 MCG/5ML IJ SOLN
INTRAMUSCULAR | Status: AC
  Filled 2015-10-19: qty 5

## 2015-10-19 MED ORDER — NEOSTIGMINE METHYLSULFATE 10 MG/10ML IV SOLN
INTRAVENOUS | Status: DC | PRN
  Administered 2015-10-19: 3 mg via INTRAVENOUS

## 2015-10-19 MED ORDER — FENTANYL CITRATE (PF) 100 MCG/2ML IJ SOLN
INTRAMUSCULAR | Status: DC | PRN
  Administered 2015-10-19: 200 ug via INTRAVENOUS

## 2015-10-19 MED ORDER — LACTATED RINGERS IV SOLN
INTRAVENOUS | Status: DC | PRN
  Administered 2015-10-19 (×2): via INTRAVENOUS

## 2015-10-19 MED ORDER — PROPOFOL 10 MG/ML IV BOLUS
INTRAVENOUS | Status: DC | PRN
  Administered 2015-10-19: 200 mg via INTRAVENOUS

## 2015-10-19 MED ORDER — SCOPOLAMINE 1 MG/3DAYS TD PT72
MEDICATED_PATCH | TRANSDERMAL | Status: AC
  Administered 2015-10-19: 12:00:00
  Filled 2015-10-19: qty 1

## 2015-10-19 MED ORDER — MIDAZOLAM HCL 5 MG/5ML IJ SOLN
INTRAMUSCULAR | Status: DC | PRN
  Administered 2015-10-19 (×2): 1 mg via INTRAVENOUS

## 2015-10-19 MED ORDER — SODIUM CHLORIDE 0.9 % IJ SOLN
INTRAMUSCULAR | Status: AC
  Filled 2015-10-19: qty 10

## 2015-10-19 MED ORDER — PROPOFOL 10 MG/ML IV BOLUS
INTRAVENOUS | Status: AC
  Filled 2015-10-19: qty 20

## 2015-10-19 MED ORDER — ONDANSETRON HCL 4 MG/2ML IJ SOLN
INTRAMUSCULAR | Status: DC | PRN
Start: 2015-10-19 — End: 2015-10-19
  Administered 2015-10-19: 4 mg via INTRAVENOUS

## 2015-10-19 MED ORDER — ROCURONIUM BROMIDE 100 MG/10ML IV SOLN
INTRAVENOUS | Status: DC | PRN
  Administered 2015-10-19: 10 mg via INTRAVENOUS
  Administered 2015-10-19: 40 mg via INTRAVENOUS

## 2015-10-19 MED ORDER — ROCURONIUM BROMIDE 50 MG/5ML IV SOLN
INTRAVENOUS | Status: AC
  Filled 2015-10-19: qty 1

## 2015-10-19 MED ORDER — MIDAZOLAM HCL 2 MG/2ML IJ SOLN
INTRAMUSCULAR | Status: AC
  Filled 2015-10-19: qty 2

## 2015-10-19 MED ORDER — OXYCODONE-ACETAMINOPHEN 5-325 MG PO TABS: 1.0000 | ORAL_TABLET | ORAL | Status: DC | PRN

## 2015-10-19 MED ORDER — HYDROMORPHONE HCL 1 MG/ML IJ SOLN: 0.2500 mg | INTRAMUSCULAR | Status: DC | PRN

## 2015-10-19 MED ORDER — LIDOCAINE HCL (CARDIAC) 20 MG/ML IV SOLN
INTRAVENOUS | Status: DC | PRN
  Administered 2015-10-19: 80 mg via INTRAVENOUS

## 2015-10-19 MED ORDER — FENTANYL CITRATE (PF) 100 MCG/2ML IJ SOLN
INTRAMUSCULAR | Status: AC
  Administered 2015-10-19: 100 ug
  Filled 2015-10-19: qty 2

## 2015-10-19 MED ORDER — ARTIFICIAL TEARS OP OINT
TOPICAL_OINTMENT | OPHTHALMIC | Status: AC
  Filled 2015-10-19: qty 7

## 2015-10-19 MED ORDER — SUCCINYLCHOLINE CHLORIDE 20 MG/ML IJ SOLN
INTRAMUSCULAR | Status: DC | PRN
  Administered 2015-10-19: 120 mg via INTRAVENOUS

## 2015-10-19 MED ORDER — BUPIVACAINE-EPINEPHRINE (PF) 0.5% -1:200000 IJ SOLN
INTRAMUSCULAR | Status: DC | PRN
  Administered 2015-10-19: 30 mL via PERINEURAL

## 2015-10-19 MED ORDER — EPHEDRINE SULFATE 50 MG/ML IJ SOLN
INTRAMUSCULAR | Status: DC | PRN
  Administered 2015-10-19: 5 mg via INTRAVENOUS
  Administered 2015-10-19 (×2): 10 mg via INTRAVENOUS

## 2015-10-19 MED ORDER — GLYCOPYRROLATE 0.2 MG/ML IJ SOLN
INTRAMUSCULAR | Status: DC | PRN
  Administered 2015-10-19 (×2): 0.1 mg via INTRAVENOUS
  Administered 2015-10-19: 0.4 mg via INTRAVENOUS

## 2015-10-19 MED ORDER — SODIUM CHLORIDE 0.9 % IR SOLN
Status: DC | PRN
  Administered 2015-10-19: 1000 mL

## 2015-10-19 MED ORDER — DEXAMETHASONE SODIUM PHOSPHATE 4 MG/ML IJ SOLN
INTRAMUSCULAR | Status: AC
  Filled 2015-10-19: qty 2

## 2015-10-19 MED ORDER — DIAZEPAM 5 MG PO TABS: 2.5000 mg | ORAL_TABLET | Freq: Four times a day (QID) | ORAL | Status: DC | PRN

## 2015-10-19 SURGICAL SUPPLY — 78 items
BLADE AVERAGE 25X9 (BLADE) IMPLANT
BLADE LONG MED 31X9 (MISCELLANEOUS) IMPLANT
BOOTCOVER CLEANROOM LRG (PROTECTIVE WEAR) IMPLANT
COVER SURGICAL LIGHT HANDLE (MISCELLANEOUS) ×2 IMPLANT
DERMABOND ADVANCED (GAUZE/BANDAGES/DRESSINGS) ×1
DERMABOND ADVANCED .7 DNX12 (GAUZE/BANDAGES/DRESSINGS) ×1 IMPLANT
DRAPE C-ARM 42X72 X-RAY (DRAPES) ×2 IMPLANT
DRAPE IMP U-DRAPE 54X76 (DRAPES) ×2 IMPLANT
DRAPE INCISE IOBAN 66X45 STRL (DRAPES) IMPLANT
DRAPE ORTHO SPLIT 77X108 STRL (DRAPES) ×1
DRAPE SURG 17X11 SM STRL (DRAPES) ×2 IMPLANT
DRAPE SURG ORHT 6 SPLT 77X108 (DRAPES) ×1 IMPLANT
DRAPE U-SHAPE 47X51 STRL (DRAPES) ×2 IMPLANT
DRSG AQUACEL AG ADV 3.5X10 (GAUZE/BANDAGES/DRESSINGS) IMPLANT
DRSG MEPILEX BORDER 4X4 (GAUZE/BANDAGES/DRESSINGS) IMPLANT
DRSG MEPILEX BORDER 4X8 (GAUZE/BANDAGES/DRESSINGS) ×4 IMPLANT
DRSG PAD ABDOMINAL 8X10 ST (GAUZE/BANDAGES/DRESSINGS) IMPLANT
DURAPREP 26ML APPLICATOR (WOUND CARE) ×2 IMPLANT
ELECT BLADE 4.0 EZ CLEAN MEGAD (MISCELLANEOUS) ×2
ELECT CAUTERY BLADE 6.4 (BLADE) ×2 IMPLANT
ELECT REM PT RETURN 9FT ADLT (ELECTROSURGICAL) ×2
ELECTRODE BLDE 4.0 EZ CLN MEGD (MISCELLANEOUS) ×1 IMPLANT
ELECTRODE REM PT RTRN 9FT ADLT (ELECTROSURGICAL) ×1 IMPLANT
GAUZE SPONGE 4X4 12PLY STRL (GAUZE/BANDAGES/DRESSINGS) ×2 IMPLANT
GLOVE BIO SURGEON STRL SZ7.5 (GLOVE) ×2 IMPLANT
GLOVE BIO SURGEON STRL SZ8 (GLOVE) ×2 IMPLANT
GLOVE EUDERMIC 7 POWDERFREE (GLOVE) ×2 IMPLANT
GLOVE SS BIOGEL STRL SZ 7.5 (GLOVE) ×1 IMPLANT
GLOVE SUPERSENSE BIOGEL SZ 7.5 (GLOVE) ×1
GOWN STRL REUS W/ TWL LRG LVL3 (GOWN DISPOSABLE) ×1 IMPLANT
GOWN STRL REUS W/ TWL XL LVL3 (GOWN DISPOSABLE) ×4 IMPLANT
GOWN STRL REUS W/TWL LRG LVL3 (GOWN DISPOSABLE) ×1
GOWN STRL REUS W/TWL XL LVL3 (GOWN DISPOSABLE) ×4
KIT AC JOINT DISP (KITS) ×2 IMPLANT
KIT BASIN OR (CUSTOM PROCEDURE TRAY) ×2 IMPLANT
KIT BIO-TENODESIS 3X8 DISP (MISCELLANEOUS)
KIT INSRT BABSR STRL DISP BTN (MISCELLANEOUS) IMPLANT
KIT ROOM TURNOVER OR (KITS) ×2 IMPLANT
MANIFOLD NEPTUNE II (INSTRUMENTS) ×2 IMPLANT
NDL SUT 6 .5 CRC .975X.05 MAYO (NEEDLE) ×1 IMPLANT
NEEDLE 1/2 CIR CATGUT .05X1.09 (NEEDLE) IMPLANT
NEEDLE HYPO 25GX1X1/2 BEV (NEEDLE) IMPLANT
NEEDLE MAYO TAPER (NEEDLE) ×1
NS IRRIG 1000ML POUR BTL (IV SOLUTION) ×2 IMPLANT
PACK SHOULDER (CUSTOM PROCEDURE TRAY) ×2 IMPLANT
PACK UNIVERSAL I (CUSTOM PROCEDURE TRAY) IMPLANT
PAD ARMBOARD 7.5X6 YLW CONV (MISCELLANEOUS) ×4 IMPLANT
PASSER SUT SWANSON 36MM LOOP (INSTRUMENTS) IMPLANT
RETRIEVER SUT LRG (INSTRUMENTS) ×2 IMPLANT
SLING ARM IMMOBILIZER LRG (SOFTGOODS) ×2 IMPLANT
SLING ARM IMMOBILIZER MED (SOFTGOODS) IMPLANT
SLING ARM LRG ADULT FOAM STRAP (SOFTGOODS) IMPLANT
SPONGE LAP 4X18 X RAY DECT (DISPOSABLE) ×2 IMPLANT
STRIP CLOSURE SKIN 1/2X4 (GAUZE/BANDAGES/DRESSINGS) IMPLANT
SUCTION FRAZIER HANDLE 10FR (MISCELLANEOUS) ×1
SUCTION TUBE FRAZIER 10FR DISP (MISCELLANEOUS) ×1 IMPLANT
SUT 2 FIBERLOOP 20 STRT BLUE (SUTURE) ×2
SUT BONE WAX W31G (SUTURE) IMPLANT
SUT FIBERWIRE #2 38 T-5 BLUE (SUTURE) ×6
SUT MNCRL AB 3-0 PS2 18 (SUTURE) ×2 IMPLANT
SUT RETRIEVER MED (INSTRUMENTS) ×2 IMPLANT
SUT RETRIEVER SML (INSTRUMENTS) IMPLANT
SUT VIC AB 1 CT1 27 (SUTURE) ×2
SUT VIC AB 1 CT1 27XBRD ANBCTR (SUTURE) ×2 IMPLANT
SUT VIC AB 2-0 CT1 27 (SUTURE) ×2
SUT VIC AB 2-0 CT1 TAPERPNT 27 (SUTURE) ×2 IMPLANT
SUT VIC AB 2-0 SH 27 (SUTURE)
SUT VIC AB 2-0 SH 27X BRD (SUTURE) IMPLANT
SUTURE 2 FIBERLOOP 20 STRT BLU (SUTURE) ×1 IMPLANT
SUTURE FIBERWR #2 38 T-5 BLUE (SUTURE) ×3 IMPLANT
SYR 20CC LL (SYRINGE) IMPLANT
SYR CONTROL 10ML LL (SYRINGE) IMPLANT
TAPE PAPER 3X10 WHT MICROPORE (GAUZE/BANDAGES/DRESSINGS) IMPLANT
TENDON SEMI-TENDINOSUS (Bone Implant) ×2 IMPLANT
TOWEL OR 17X24 6PK STRL BLUE (TOWEL DISPOSABLE) ×2 IMPLANT
TOWEL OR 17X26 10 PK STRL BLUE (TOWEL DISPOSABLE) ×2 IMPLANT
WATER STERILE IRR 1000ML POUR (IV SOLUTION) IMPLANT
ZIPLOOP AC JOINT REPAIR (Orthopedic Implant) ×2 IMPLANT

## 2015-10-19 NOTE — Discharge Instructions (Signed)
Ok to allow arm to dangle and to move it for hygiene. °Sling on but can remove to allow arm to dangle and move elbow wrist and hand. °Leave current dressing on until day 3, then can remove to perform dressing changes  °You may shower on day 3 and allow water to run across incision, then pat it dry and perform dressing change °Call [phone] for any questions or concerns ° °

## 2015-10-19 NOTE — Transfer of Care (Signed)
Immediate Anesthesia Transfer of Care Note  Patient: Patrick Oconnor  Procedure(s) Performed: Procedure(s): OPEN RIGHT SHOULDER ROTATOR CUFF REPAIR  (Right) OPEN RIGHT SHOULDER ACROMIO-CLAVICULAR JOINT RECONSTRUCTION  (Right)  Patient Location: PACU  Anesthesia Type:GA combined with regional for post-op pain  Level of Consciousness: awake and alert   Airway & Oxygen Therapy: Patient Spontanous Breathing and Patient connected to nasal cannula oxygen  Post-op Assessment: Report given to RN and Post -op Vital signs reviewed and stable  Post vital signs: Reviewed and stable  Last Vitals:  Filed Vitals:   10/19/15 1133 10/19/15 1704  BP: 155/89   Pulse: 67   Temp: 36.8 C   Resp:  17    Complications: No apparent anesthesia complications

## 2015-10-19 NOTE — Op Note (Signed)
NAME:  CION, BENZING NO.:  [id number]  MEDICAL RECORD NO.:  [id number]  LOCATION:  [facility]                         FACILITY:  [facility]  PHYSICIAN:  Metta Clines. Betha Shadix, M.D.  DATE OF BIRTH:  March 04, 1966  DATE OF PROCEDURE:  10/19/2015 DATE OF DISCHARGE:  10/19/2015                              OPERATIVE REPORT   PREOPERATIVE DIAGNOSES: 1. Right shoulder subscapularis tear. 2. Right shoulder symptomatic acromioclavicular (AC) joint     instability.  POSTOPERATIVE DIAGNOSES: 1. Right shoulder subscapularis tear. 2. Right shoulder symptomatic acromioclavicular (AC) joint     instability.  PROCEDURE: 1. Open right shoulder subscapularis repair. 2. Open right shoulder acromioclavicular (AC) joint reconstruction,     using a zip loop as well as semi tendinosis allograft.  SURGEON:  Metta Clines. Empress Newmann, M.D.  Terrence DupontOlivia Mackie A. Shuford, P.A.-C.  ANESTHESIA:  General endotracheal as well as an interscalene block.  ESTIMATED BLOOD LOSS:  Minimal.  DRAINS:  None.  HISTORY:  Mr. Dilmore is a 50 year old gentleman, who has had chronic right shoulder acromioclavicular joint instability after previous reconstruction, which unfortunately went on to persistent instability and symptomatic weakness and laxity.  More recently, however, he fell while snow skiing, re-injuring.  Further injuring the right shoulder with subsequent reduced strength of internal rotation, loss of mobility and MRI scan was obtained, which showed that he had ruptured the upper third of the subscapularis.  Given the combined injury and the progression of his overall pain and functional limitations, he was brought to the operating room at this time for planned rotator cuff repair and revision ACL reconstruction as described below.  We will get preoperatively counseled Mr. Vantassel regarding treatment options and potential risks versus benefits thereof.  Possible surgical complications were reviewed  including bleeding, infection, neurovascular injury, persistent pain, loss of motion, anesthetic complication, persistence and recurrence of instability, and possible need for additional surgery.  He understands and accepts and agrees with our planned procedure.  PROCEDURE IN DETAIL:  After undergoing routine preop evaluation, the patient received prophylactic antibiotics.  An interscalene block was established in the holding area by the Anesthesia Department.  Placed supine on the operating table.  Underwent smooth induction of a general endotracheal anesthesia.  Placed in beach-chair position and appropriately padded and protected.  The right shoulder girdle region was then sterilely prepped and draped in standard fashion.  Time-out was called.  We initially made an anterolateral deltoid splitting incision from the anterolateral corner of the acromion for 5 cm laterally and distally.  Skin flaps were elevated sharply and electrocautery was used for hemostasis.  We then split the deltoid fibers along with along its raphe for approximately 5 cm from the anterolateral margin of the acromion.  This allowed [place] to gain access to the subacromial/subdeltoid space, where adhesions were divided manually.  This allowed direct visualization of the upper subscap and the tear actually to be more of a side-to-side split along the rotator interval.  I carefully dissected the subscapularis and really the majority of the subscap was intact, and just the upper perhaps 25% had been separated along the rotator interval and given the location of this tear went ahead and abraded the adjacent bone  on the lesser tuberosity.  I was able to perform a side-to-side repair of the anterior margin of the supraspinatus and interval tissues to the upper subscapularis and put a pair of figure-of-eight stitches all along his interval, which nicely closed this interval and reapproximated the tissues over the lesser  tuberosity.  I should mention that the biceps tendon was found to be stable within the bicipital groove.  The suture limbs then appropriately trimmed and then the deltopectoral split was reapproximated with a series of figure-of-eight #1 Vicryl sutures.  A 2-0 Vicryl used for the subcu layer, intracuticular 3-0 Monocryl for the skin and ultimately closed with Dermabond.  At this point, we then turned our attention to the previous incision along the distal clavicle towards the coracoid and this was reopened and with a saber style incision for approximately 6 cm.  Skin flaps were elevated.  Electrocautery was again used for hemostasis.  We then dissected deeply such that we could visualize the clavipectoral fascia and this was divided from medial to lateral, exposing the dorsal surface of the distal clavicle and this was then exposed with subperiosteal dissection, such that we could visualize the previously placed metal anchor from the zip loop construct.  We then dissected anterior and posterior along the clavicle for later passage of our allograft.  Then, I dissected anteriorly and deeply toward the coracoid. I was able to remove the previously placed dorsal button and then removed the underlying retained suture material from the zip loop that had been previously placed.  I then scarified the coracoid and identified the previously placed drill hole, which did appear to be appropriate for needs.  I passed a guide pin and then re-reamed with a 45 reamer, through the coracoid drill hole to reopen this with appropriate dimensions and then similarly we reopened the drill hole through the clavicle.  We then passed the zip loop construct down through the clavicle tunnel and into the coracoid tunnel and then used fluoroscopy to confirm that the button had flipped beneath the coracoid proper tensioning.  At this point, we then passed a shuttle suture beneath the coracoid and shuttled our semi  tendinosis allograft, which we prepared on the back table, Whip stitching through one end.  Once the semi tendinosis was delivered beneath, the coracoid we then terminally tensioned the zip loop, which allowed for excellent reapposition and closure of the coracoclavicular interval.  Our allograft was then taken such that the medial limb was passed posterior to the distal clavicle and then the limbs of the semi tendinosis were tensioned and sutured to one another, using #2 FiberWire, multiple Whip stitches, which allowed a nice solid construct and then the knot stack was pushed deep such that there was no prominence on the dorsum of the clavicle from the Whip stitch and knot stack of the allograft.  We again confirmed proper tension of the zip loop and then trimmed the zip loop sutures.  The wound was then copiously irrigated.  We did obtain a final fluoroscopic images, which confirmed the clavicle had been nicely reapproximated toward the coracoid.  The overall construct was much to our satisfaction.  At this point, we then carefully re-approximated and repaired the periosteal layers around the distal clavicle using a series of figure-of-eight #1 Vicryl sutures.  2-0 Vicryl was used for the subcu layer, intracuticular 3-0 Monocryl for the skin.  Again followed by a Dermabond.  A water proof dressing was then applied.  The right arm was placed into  a sling immobilizer and then the patient was awakened, extubated, and taken to the recovery room in stable condition.  Jenetta Loges, PA-C was used as an Environmental consultant throughout this case and essential for help with positioning of the patient, positioning of the extremity, soft tissue retraction, reduction of the clavicle, wound closure, and intraoperative decision making.     Metta Clines. Michie Molnar, M.D.     KMS/MEDQ  D:  10/19/2015  T:  10/19/2015  Job:  [id number]

## 2015-10-19 NOTE — H&P (Signed)
Patrick Oconnor    Chief Complaint: RIGHT SHOULDER ROTATOR CUFF TEAR RIGHT SHOULDER ACL JOINT INSTABILITY  HPI: The patient is a 50 y.o. male with recent right shoulder injury sustaining acute rupture of the subscapularis, and in addition with chronic, recurrent right shoulder AC joint instability.  Past Medical History  Diagnosis Date  . GERD (gastroesophageal reflux disease)   . Sleep apnea 9/08    Started CPAP; does not wear CPAP nightly  . PONV (postoperative nausea and vomiting)   . Hypertension   . Pneumonia     hx of  . Asthma, exercise induced   . IBS (irritable bowel syndrome)   . Environmental allergies   . Depression   . History of bronchitis   . Anemia   . Degenerative disc disease, lumbar     Past Surgical History  Procedure Laterality Date  . Pyloroplasty  infant    pyloric stenosis  . Tonsillectomy and adenoidectomy    . Pilonidal cystectomy  42003    x 2  . Thrombosed external hemorrhoid  9/08    incised  . Esophagogastroduodenoscopy  3/09    normal; multiple  . Colonoscopy  08/12/2005    "multiple"  . Acromio-clavicular joint repair Right 04/15/2013    Procedure: RIGHT SHOULDER ACROMIO-CLAVICULAR RECONSTRUCTION WITH ALLOGRAFT   ;  Surgeon: Marin Shutter, MD;  Location: [facility];  Service: Orthopedics;  Laterality: Right;  . Septoplasty    . Incision and drainage Right 06/03/2013    Procedure: INCISION AND DRAINAGE RIGHT SHOULDER;  Surgeon: Marin Shutter, MD;  Location: [facility];  Service: Orthopedics;  Laterality: Right;  . Acromio-clavicular joint repair Right 06/03/2013    Procedure: ACROMIO-CLAVICULAR JOINT REPAIR;  Surgeon: Marin Shutter, MD;  Location: [facility];  Service: Orthopedics;  Laterality: Right;    Family History  Problem Relation Age of Onset  . Cancer Paternal Grandmother     Colon  . Cancer Maternal Grandmother     breast  . Cancer Father 53    prostate Stage 4    Social History:  reports that he has never smoked. He has never used  smokeless tobacco. He reports that he drinks alcohol. He reports that he does not use illicit drugs.   Medications Prior to Admission  Medication Sig Dispense Refill  . albuterol (PROVENTIL,VENTOLIN) 90 MCG/ACT inhaler Inhale 2 puffs into the lungs every 6 (six) hours as needed for shortness of breath.     Marland Kitchen aspirin 81 MG tablet Take 1 tablet (81 mg total) by mouth daily. 30 tablet 3  . citalopram (CELEXA) 20 MG tablet Take 1 tablet (20 mg total) by mouth daily. 90 tablet 1  . ferrous sulfate 325 (65 FE) MG tablet Take 1 tablet (325 mg total) by mouth daily with breakfast. 60 tablet 3  . fluticasone (FLONASE) 50 MCG/ACT nasal spray Place 2 sprays into both nostrils daily. (Patient taking differently: Place 2 sprays into both nostrils daily as needed for allergies. ) 16 g 2  . hydrochlorothiazide (HYDRODIURIL) 25 MG tablet TAKE 1 TABLET BY MOUTH DAILY. 90 tablet 1  . ibuprofen (ADVIL,MOTRIN) 200 MG tablet Take 600 mg by mouth every 8 (eight) hours as needed for mild pain or moderate pain.    Marland Kitchen loratadine (CLARITIN) 10 MG tablet Take 10 mg by mouth daily.     . meloxicam (MOBIC) 15 MG tablet Take 1 tablet (15 mg total) by mouth daily. 90 tablet 1  . Multiple Vitamin (MULTIVITAMIN WITH MINERALS) TABS  tablet Take 1 tablet by mouth daily as needed (for vitamin).     . naproxen (NAPROSYN) 500 MG tablet Take 1 tablet (500 mg total) by mouth 2 (two) times daily with a meal. (Patient taking differently: Take 500 mg by mouth 2 (two) times daily as needed for mild pain. ) 60 tablet 1  . omeprazole (PRILOSEC) 20 MG capsule TAKE 1 CAPSULE BY MOUTH ONCE DAILY 90 capsule 3  . ondansetron (ZOFRAN) 4 MG tablet Take 4 mg by mouth every 8 (eight) hours as needed for nausea or vomiting.    . polyethylene glycol powder (GLYCOLAX/MIRALAX) powder Take 17 g by mouth 2 (two) times daily as needed. 3350 g 1  . Probiotic Product (PROBIOTIC DAILY PO) Take 1 tablet by mouth daily as needed (probiotic).     Marland Kitchen rOPINIRole  (REQUIP) 0.25 MG tablet Take 1 tablet (0.25 mg total) by mouth at bedtime. 30 tablet 2  . sennosides-docusate sodium (SENOKOT-S) 8.6-50 MG tablet Take 2 tablets by mouth daily. (Patient taking differently: Take 1-2 tablets by mouth daily as needed. ) 20 tablet 0  . Tetrahydroz-Dextran-PEG-Povid (VISINE ADVANCED RELIEF OP) Apply 1-2 drops to eye as needed.    . traZODone (DESYREL) 50 MG tablet Take 50 mg by mouth at bedtime as needed for sleep.       Physical Exam: right shoulder with pain and weakness with AC joint instability as noted at recent office visits.  Vitals  Temp:  [98.3 F (36.8 C)] 98.3 F (36.8 C) (03/09 1133) Pulse Rate:  [67] 67 (03/09 1133) BP: (155)/(89) 155/89 mmHg (03/09 1133) SpO2:  [97 %] 97 % (03/09 1133) Weight:  [117.482 kg (259 lb)] 117.482 kg (259 lb) (03/09 1133)  Assessment/Plan  Impression: RIGHT SHOULDER ROTATOR CUFF TEAR, RIGHT SHOULDER A-C JOINT INSTABILITY   Plan of Action: Procedure(s): OPEN RIGHT SHOULDER ROTATOR CUFF REPAIR  OPEN RIGHT SHOULDER ACROMIO-CLAVICULAR JOINT RECONSTRUCTION   Patrick Oconnor M Patrick Oconnor 10/19/2015, 1:45 PM Contact # [phone]

## 2015-10-19 NOTE — Op Note (Signed)
10/19/2015  4:45 PM  PATIENT:   Patrick Oconnor  50 y.o. male  PRE-OPERATIVE DIAGNOSIS:  RIGHT SHOULDER ROTATOR CUFF TEAR, RIGHT SHOULDER A-C JOINT INSTABILITY   POST-OPERATIVE DIAGNOSIS:  same  PROCEDURE:  Open R RCR, open R A-C joint reconstruction  SURGEON:  Dorota Heinrichs, Metta Clines M.D.  ASSISTANTS: Shuford pac   ANESTHESIA:   GET + ISB  EBL: minimal  SPECIMEN:  none  Drains: none   PATIENT DISPOSITION:  PACU - hemodynamically stable.    PLAN OF CARE: Discharge to home after PACU  Dictation# [id number]   Contact # [phone]

## 2015-10-19 NOTE — Anesthesia Procedure Notes (Addendum)
Anesthesia Regional Block:  Interscalene brachial plexus block  Pre-Anesthetic Checklist: ,, timeout performed, Correct Patient, Correct Site, Correct Laterality, Correct Procedure, Correct Position, site marked, Risks and benefits discussed,  Surgical consent,  Pre-op evaluation,  At surgeon's request and post-op pain management   Prep: chloraprep       Needles:  Injection technique: Single-shot  Needle Type: Echogenic Stimulator Needle     Needle Length: 9cm 9 cm Needle Gauge: 21 and 21 G  Needle insertion depth: 3.5 cm   Additional Needles:  Procedures: ultrasound guided (picture in chart) Interscalene brachial plexus block Narrative:  Injection made incrementally with aspirations every 5 mL.  Additional Notes: Patient tolerated procedure well.    Procedure Name: Intubation Date/Time: 10/19/2015 2:40 PM Performed by: Suzy Bouchard Pre-anesthesia Checklist: Patient identified, Emergency Drugs available, Suction available, Timeout performed and Patient being monitored Patient Re-evaluated:Patient Re-evaluated prior to inductionOxygen Delivery Method: Circle system utilized Preoxygenation: Pre-oxygenation with 100% oxygen Intubation Type: IV induction and Rapid sequence Laryngoscope Size: Glidescope and 3 Grade View: Grade I Tube type: Oral Laser Tube: Cuffed inflated with minimal occlusive pressure - saline Tube size: 7.5 mm Number of attempts: 1 Airway Equipment and Method: Stylet and Video-laryngoscopy Placement Confirmation: ETT inserted through vocal cords under direct vision,  positive ETCO2 and breath sounds checked- equal and bilateral Secured at: 22 cm Tube secured with: Tape Dental Injury: Teeth and Oropharynx as per pre-operative assessment  Comments: Prior history of multiple DL's for intubation. Elective glidescope intubation with succinylcholine.  Patient not masked.

## 2015-10-19 NOTE — Anesthesia Preprocedure Evaluation (Addendum)
Anesthesia Evaluation  Patient identified by MRN, date of birth, ID band Patient awake    Reviewed: Allergy & Precautions, NPO status , Patient's Chart, lab work & pertinent test results  History of Anesthesia Complications (+) PONV and history of anesthetic complications  Airway Mallampati: III  TM Distance: >3 FB Neck ROM: Full    Dental no notable dental hx. (+) Teeth Intact, Dental Advisory Given   Pulmonary asthma , sleep apnea and Continuous Positive Airway Pressure Ventilation , pneumonia, resolved,    Pulmonary exam normal breath sounds clear to auscultation       Cardiovascular hypertension, Pt. on medications Normal cardiovascular exam Rhythm:Regular Rate:Normal     Neuro/Psych  Headaches, PSYCHIATRIC DISORDERS Depression Restless legs syndrome    GI/Hepatic Neg liver ROS, GERD  Medicated and Controlled,  Endo/Other  Obesity  Renal/GU negative Renal ROS  negative genitourinary   Musculoskeletal  (+) Arthritis , Osteoarthritis,  DDD lumbar spine DJD right shoulder    Abdominal (+) + obese,   Peds  Hematology  (+) anemia ,   Anesthesia Other Findings   Reproductive/Obstetrics                            Anesthesia Physical Anesthesia Plan  ASA: III  Anesthesia Plan: General   Post-op Pain Management: GA combined w/ Regional for post-op pain   Induction: Intravenous  Airway Management Planned: Oral ETT  Additional Equipment:   Intra-op Plan:   Post-operative Plan: Extubation in OR  Informed Consent: I have reviewed the patients History and Physical, chart, labs and discussed the procedure including the risks, benefits and alternatives for the proposed anesthesia with the patient or authorized representative who has indicated his/her understanding and acceptance.   Dental advisory given  Plan Discussed with: CRNA, Anesthesiologist and Surgeon  Anesthesia Plan  Comments:         Anesthesia Quick Evaluation

## 2015-10-20 ENCOUNTER — Encounter (HOSPITAL_COMMUNITY): Payer: Self-pay | Admitting: Orthopedic Surgery

## 2015-10-20 NOTE — Anesthesia Postprocedure Evaluation (Signed)
Anesthesia Post Note  Patient: Patrick Oconnor  Procedure(s) Performed: Procedure(s) (LRB): OPEN RIGHT SHOULDER ROTATOR CUFF REPAIR  (Right) OPEN RIGHT SHOULDER ACROMIO-CLAVICULAR JOINT RECONSTRUCTION  (Right)  Patient location during evaluation: PACU Anesthesia Type: General and Regional Level of consciousness: sedated and patient cooperative Pain management: pain level controlled Vital Signs Assessment: post-procedure vital signs reviewed and stable Respiratory status: spontaneous breathing Cardiovascular status: stable Anesthetic complications: no    Last Vitals:  Filed Vitals:   10/19/15 1730 10/19/15 1745  BP: 146/100 132/91  Pulse: 76 96  Temp:  36.7 C  Resp: 14 16    Last Pain:  Filed Vitals:   10/19/15 1757  PainSc: 0-No pain                 Nolon Nations

## 2015-10-27 DIAGNOSIS — Z4789 Encounter for other orthopedic aftercare: Secondary | ICD-10-CM | POA: Diagnosis not present

## 2015-11-13 MED FILL — CITALOPRAM HBR 20 MG TABLET: 20 | 30 days supply | Qty: 30 | Fill #1

## 2015-12-19 ENCOUNTER — Ambulatory Visit: Payer: 59 | Attending: Orthopedic Surgery | Admitting: Physical Therapy

## 2015-12-19 ENCOUNTER — Other Ambulatory Visit: Payer: Self-pay | Admitting: *Deleted

## 2015-12-19 DIAGNOSIS — M6281 Muscle weakness (generalized): Secondary | ICD-10-CM | POA: Diagnosis not present

## 2015-12-19 DIAGNOSIS — M25511 Pain in right shoulder: Secondary | ICD-10-CM | POA: Insufficient documentation

## 2015-12-19 DIAGNOSIS — M25611 Stiffness of right shoulder, not elsewhere classified: Secondary | ICD-10-CM | POA: Insufficient documentation

## 2015-12-20 MED ORDER — CITALOPRAM HYDROBROMIDE 20 MG PO TABS: 20.0000 mg | ORAL_TABLET | Freq: Every day | ORAL | Status: DC

## 2015-12-20 MED FILL — CITALOPRAM HBR 20 MG TABLET: 20 | 90 days supply | Qty: 90 | Fill #0

## 2015-12-20 NOTE — Therapy (Signed)
[place], [place], [address] Phone: [phone]   Fax:  [phone]  Physical Therapy Treatment  Patient Details  Name: Patrick Oconnor MRN: [id number] Date of Birth: October 07, 1965 Referring Provider: Dr Justice Britain   Encounter Date: 12/19/2015      PT End of Session - 12/20/15 0738    Visit Number 1   Number of Visits 24   Date for PT Re-Evaluation 03/13/16   Authorization Type [facility] employee    PT Start Time 1500   PT Stop Time 1545   PT Time Calculation (min) 45 min   Activity Tolerance Patient tolerated treatment well   Behavior During Therapy [facility] for tasks assessed/performed      Past Medical History  Diagnosis Date  . GERD (gastroesophageal reflux disease)   . Sleep apnea 9/08    Started CPAP; does not wear CPAP nightly  . PONV (postoperative nausea and vomiting)   . Hypertension   . Pneumonia     hx of  . Asthma, exercise induced   . IBS (irritable bowel syndrome)   . Environmental allergies   . Depression   . History of bronchitis   . Anemia   . Degenerative disc disease, lumbar     Past Surgical History  Procedure Laterality Date  . Pyloroplasty  infant    pyloric stenosis  . Tonsillectomy and adenoidectomy    . Pilonidal cystectomy  42003    x 2  . Thrombosed external hemorrhoid  9/08    incised  . Esophagogastroduodenoscopy  3/09    normal; multiple  . Colonoscopy  08/12/2005    "multiple"  . Acromio-clavicular joint repair Right 04/15/2013    Procedure: RIGHT SHOULDER ACROMIO-CLAVICULAR RECONSTRUCTION WITH ALLOGRAFT   ;  Surgeon: Marin Shutter, MD;  Location: [place];  Service: Orthopedics;  Laterality: Right;  . Septoplasty    . Incision and drainage Right 06/03/2013    Procedure: INCISION AND DRAINAGE RIGHT SHOULDER;  Surgeon: Marin Shutter, MD;  Location: [place];  Service: Orthopedics;  Laterality: Right;  . Acromio-clavicular joint repair Right 06/03/2013    Procedure:  ACROMIO-CLAVICULAR JOINT REPAIR;  Surgeon: Marin Shutter, MD;  Location: [place];  Service: Orthopedics;  Laterality: Right;  . Shoulder open rotator cuff repair Right 10/19/2015    Procedure: OPEN RIGHT SHOULDER ROTATOR CUFF REPAIR ;  Surgeon: Justice Britain, MD;  Location: [place];  Service: Orthopedics;  Laterality: Right;  . Acromio-clavicular joint repair Right 10/19/2015    Procedure: OPEN RIGHT SHOULDER ACROMIO-CLAVICULAR JOINT RECONSTRUCTION ;  Surgeon: Justice Britain, MD;  Location: [place];  Service: Orthopedics;  Laterality: Right;    There were no vitals filed for this visit.      Subjective Assessment - 12/19/15 1517    Subjective Pt reports no pain just sitting here today. His pain increases with activity but he is limiting his activity. He reports difficulty reaching behind his back. He was advised not to stretch too far behind his back until he has some internal rotation back.   Pertinent History Pt has a history of AC joint reconstrutions. He has fallen skiing on 2 other occasions and suffered seprations. He also has a RTC repair on 10/19/15/ Since that point he is out of his sling. He has been doing pendulums at home. He is having difficultys performing ADL's with his non-dominant hand .    Limitations Lifting;Other (comment)  use of R arm    How long can  you sit comfortably? N/A   How long can you stand comfortably? N/A    How long can you walk comfortably? N/A    Diagnostic tests nothing post op    Patient Stated Goals Get back to work    Currently in Pain? Yes   Pain Score 1   0-1/10 pain just sitting    Pain Location Shoulder   Pain Orientation Right   Pain Descriptors / Indicators Aching   Pain Onset More than a month ago   Pain Frequency Intermittent   Aggravating Factors  movement    Pain Relieving Factors rest, pain relievers    Effect of Pain on Daily Activities diffifuclty using right arm             [facility] PT Assessment - 12/20/15 0001    Assessment   Medical  Diagnosis right rotator cuff repair with AC joint re-construction    Onset Date/Surgical Date 10/19/15   Hand Dominance Right   Next MD Visit 12/27/15   Prior Therapy n   Precautions   Precautions None   Restrictions   Weight Bearing Restrictions No   Home Environment   Living Environment Private residence   Prior Function   Level of Independence Independent   Cognition   Overall Cognitive Status Within Functional Limits for tasks assessed   Observation/Other Assessments   Observations well healing mini-open surgical wound    Sensation   Light Touch Appears Intact   Coordination   Gross Motor Movements are Fluid and Coordinated Yes   Fine Motor Movements are Fluid and Coordinated Yes   ROM / Strength   AROM / PROM / Strength AROM;PROM;Strength   AROM   Overall AROM Comments Not tested 2nd to recent surgery    AROM Assessment Site Shoulder   Right/Left Shoulder Right;Left   PROM   PROM Assessment Site Shoulder   Right/Left Shoulder Right;Left   Right Shoulder Flexion 145 Degrees   Right Shoulder Internal Rotation 70 Degrees   Right Shoulder External Rotation 50 Degrees   Right Shoulder Horizontal ABduction 90 Degrees   Strength   Overall Strength Comments Not tested 2nd to recent commercial    Strength Assessment Site Shoulder   Right/Left Shoulder Right;Left                             PT Education - 12/20/15 0737    Education provided Yes   Education Details Pt educated on the importance of letting pain be his guide. He was educated on symptom mangement and HEP.    Person(s) Educated Patient   Methods Explanation;Handout   Comprehension Verbalized understanding;Returned demonstration;Tactile cues required;Verbal cues required          PT Short Term Goals - 12/20/15 0755    PT SHORT TERM GOAL #1   Title Pt's shoulder strength will be measured at 4/5    Baseline Not tested 2nd to surgery    Time 6   Period Weeks   Status New   PT SHORT  TERM GOAL #2   Title Pt will be I w/ HEP    Time 6   Period Weeks   Status New   PT SHORT TERM GOAL #3   Title Pt will increase passive range of motion to 70 degrees   Baseline 50 degrees    Time 6   Period Weeks   Status On-going   PT SHORT TERM GOAL #4   Title Pt  will increase passive right shoulder flexion to 160 degrees    Baseline 145 degrees    Time 6   Period Weeks   Status New           PT Long Term Goals - 12/20/15 0757    PT LONG TERM GOAL #1   Title Patient will reach overhead to a cabinet without pain in order to perfrom IADL's    Time 12   Period Weeks   Status New   PT LONG TERM GOAL #2   Title Patient will reach behind his back to t-8 in order to perfrom ADL's    Time 12   Period Weeks   Status New   PT LONG TERM GOAL #3   Title Patient will transfer patient with gait belt without increased pain in order to return to work.   Time 12   Period Weeks   Status New   PT LONG TERM GOAL #4   Title Pt will demosntrate 5/5 right shoulder strength with good endurance in order to return to work.    Time 12   Period Weeks   Status New               Plan - 12/20/15 0739    Clinical Impression Statement Pt is a 50 year old male S/P R rotator cuff repaitr and AC joint reconstruction on 12/19/15. He has had two other AC joint reconsturctions in the past. He is 8 weeks post op. He is out of his sling. He is having difficulty using his dominant hand for ADL's. His c/o is diffciulty reaching and difficulty putting his belt on. He is also rebuliding a house. He would benefit from skilled therapy to retun to baseline mobility. He was seen today for a low complexity evaluation.    PT Frequency 2x / week   PT Duration 8 weeks   PT Treatment/Interventions ADLs/Self Care Home Management;Moist Heat;Electrical Stimulation;Ultrasound;Functional mobility training;Therapeutic exercise;Manual techniques;Patient/family education;Therapeutic activities   PT Next Visit Plan Pt  is at 8 weeks post op. Per protocol he can begin light stregthening but we will be conservative 2nd to his [facility] joint sepration and his self reported tendency to develop tendinitis. He was advised severalt times to let pain be his guide. Continue with HEP. Condsider isometrics. consdier serratus punch and rythmic stabilization at 90 degrees of flexion and neutral ER.    PT Home Exercise Plan Supine wand flexion, and ER, Standing wand IR with wide grip; scap retraction to neutral  w/ yellow t-band; Shoulder extension to neutral with yellow band.       Patient will benefit from skilled therapeutic intervention in order to improve the following deficits and impairments:  Decreased activity tolerance, Decreased mobility, Decreased strength, Pain, Impaired tone  Visit Diagnosis: Pain in right shoulder  Stiffness of right shoulder, not elsewhere classified  Muscle weakness (generalized)     Problem List Patient Active Problem List   Diagnosis Date Noted  . Headache 04/27/2015  . Anemia, iron deficiency 02/28/2015  . Arthritis 02/21/2015  . Orthostatic hypotension 02/21/2015  . Constipation 07/04/2014  . Preventative health care 01/07/2014  . Postoperative wound infection 06/16/2013  . Family history of prostate cancer 02/09/2013  . Fatigue 02/09/2013  . Low back pain 06/23/2012  . BENIGN POSITIONAL VERTIGO, HX OF 05/16/2009  . HYPERTENSION, MILD 07/26/2008  . EIC (epidermal inclusion cyst) 12/22/2007  . RESTLESS LEG SYNDROME, MILD 09/29/2007  . Sleep apnea 03/17/2007  . GLUCOSE INTOLERANCE 10/09/2006  .  OBESITY, NOS 10/09/2006  . Depression 10/09/2006  . Allergic rhinitis 10/09/2006  . ASTHMA, INTERMITTENT 10/09/2006  . GASTROESOPHAGEAL REFLUX, NO ESOPHAGITIS 10/09/2006  . IRRITABLE BOWEL SYNDROME 10/09/2006    Carney Living PT DPT  12/20/2015, 8:06 AM  [facility] [address] [place], [place], [address] Phone: [phone]    Fax:  [phone]  Name: TABIUS TORAIN MRN: [id number] Date of Birth: 1966-02-20

## 2015-12-21 ENCOUNTER — Other Ambulatory Visit: Payer: Self-pay | Admitting: *Deleted

## 2015-12-21 ENCOUNTER — Ambulatory Visit: Payer: 59 | Admitting: Physical Therapy

## 2015-12-21 DIAGNOSIS — M25611 Stiffness of right shoulder, not elsewhere classified: Secondary | ICD-10-CM

## 2015-12-21 DIAGNOSIS — M6281 Muscle weakness (generalized): Secondary | ICD-10-CM

## 2015-12-21 DIAGNOSIS — G2581 Restless legs syndrome: Secondary | ICD-10-CM

## 2015-12-21 DIAGNOSIS — M25511 Pain in right shoulder: Secondary | ICD-10-CM

## 2015-12-21 MED FILL — HYDROCHLOROTHIAZIDE 25 MG T: 25 | 90 days supply | Qty: 90 | Fill #1

## 2015-12-21 MED FILL — OMEPRAZOLE DR 20 MG CAPSULE: 20 | 90 days supply | Qty: 90 | Fill #3

## 2015-12-21 NOTE — Therapy (Signed)
[place], [place], [address] Phone: [phone]   Fax:  [phone]  Physical Therapy Treatment  Patient Details  Name: Patrick Oconnor MRN: [id number] Date of Birth: 1966-02-01 Referring Provider: Dr Justice Britain   Encounter Date: 12/21/2015      PT End of Session - 12/21/15 1536    Visit Number 2   Number of Visits 24   Date for PT Re-Evaluation 03/13/16   Authorization Type [facility] employee    PT Start Time 0300   PT Stop Time 0353   PT Time Calculation (min) 53 min      Past Medical History  Diagnosis Date  . GERD (gastroesophageal reflux disease)   . Sleep apnea 9/08    Started CPAP; does not wear CPAP nightly  . PONV (postoperative nausea and vomiting)   . Hypertension   . Pneumonia     hx of  . Asthma, exercise induced   . IBS (irritable bowel syndrome)   . Environmental allergies   . Depression   . History of bronchitis   . Anemia   . Degenerative disc disease, lumbar     Past Surgical History  Procedure Laterality Date  . Pyloroplasty  infant    pyloric stenosis  . Tonsillectomy and adenoidectomy    . Pilonidal cystectomy  42003    x 2  . Thrombosed external hemorrhoid  9/08    incised  . Esophagogastroduodenoscopy  3/09    normal; multiple  . Colonoscopy  08/12/2005    "multiple"  . Acromio-clavicular joint repair Right 04/15/2013    Procedure: RIGHT SHOULDER ACROMIO-CLAVICULAR RECONSTRUCTION WITH ALLOGRAFT   ;  Surgeon: Marin Shutter, MD;  Location: [place];  Service: Orthopedics;  Laterality: Right;  . Septoplasty    . Incision and drainage Right 06/03/2013    Procedure: INCISION AND DRAINAGE RIGHT SHOULDER;  Surgeon: Marin Shutter, MD;  Location: [place];  Service: Orthopedics;  Laterality: Right;  . Acromio-clavicular joint repair Right 06/03/2013    Procedure: ACROMIO-CLAVICULAR JOINT REPAIR;  Surgeon: Marin Shutter, MD;  Location: [place];  Service: Orthopedics;   Laterality: Right;  . Shoulder open rotator cuff repair Right 10/19/2015    Procedure: OPEN RIGHT SHOULDER ROTATOR CUFF REPAIR ;  Surgeon: Justice Britain, MD;  Location: [place];  Service: Orthopedics;  Laterality: Right;  . Acromio-clavicular joint repair Right 10/19/2015    Procedure: OPEN RIGHT SHOULDER ACROMIO-CLAVICULAR JOINT RECONSTRUCTION ;  Surgeon: Justice Britain, MD;  Location: [place];  Service: Orthopedics;  Laterality: Right;    There were no vitals filed for this visit.                       [place] Adult PT Treatment/Exercise - 12/21/15 0001    Exercises   Exercises Shoulder   Shoulder Exercises: Supine   Other Supine Exercises supine cane pullovers and ER   Shoulder Exercises: Seated   Other Seated Exercises shoulder rolls and scap squeezes x 10   Shoulder Exercises: Standing   Extension Strengthening;15 reps;Theraband;Both   Theraband Level (Shoulder Extension) Level 2 (Red)   Row Strengthening;Both;15 reps;Theraband   Theraband Level (Shoulder Row) Level 2 (Red)   Shoulder Exercises: Pulleys   Flexion 2 minutes   Shoulder Exercises: Isometric Strengthening   Flexion Limitations --  30 sec x 5   External Rotation --  30 sec x 3   Internal Rotation --  30 sec x 3  ABduction --  30 sec x 3   Modalities   Modalities Cryotherapy   Cryotherapy   Number Minutes Cryotherapy 15 Minutes   Cryotherapy Location Shoulder   Type of Cryotherapy Other (comment)  vaso no pressure   Manual Therapy   Manual Therapy Joint mobilization;Passive ROM   Joint Mobilization Grade 2 A/P and inferior to right GH jt    Passive ROM Flexion,ER, IR, abduction                   PT Short Term Goals - 12/20/15 0755    PT SHORT TERM GOAL #1   Title Pt's shoulder strength will be measured at 4/5    Baseline Not tested 2nd to surgery    Time 6   Period Weeks   Status New   PT SHORT TERM GOAL #2   Title Pt will be I w/ HEP    Time 6   Period Weeks   Status New   PT  SHORT TERM GOAL #3   Title Pt will increase passive range of motion to 70 degrees   Baseline 50 degrees    Time 6   Period Weeks   Status On-going   PT SHORT TERM GOAL #4   Title Pt will increase passive right shoulder flexion to 160 degrees    Baseline 145 degrees    Time 6   Period Weeks   Status New           PT Long Term Goals - 12/20/15 0757    PT LONG TERM GOAL #1   Title Patient will reach overhead to a cabinet without pain in order to perfrom IADL's    Time 12   Period Weeks   Status New   PT LONG TERM GOAL #2   Title Patient will reach behind his back to t-8 in order to perfrom ADL's    Time 12   Period Weeks   Status New   PT LONG TERM GOAL #3   Title Patient will transfer patient with gait belt without increased pain in order to return to work.   Time 12   Period Weeks   Status New   PT LONG TERM GOAL #4   Title Pt will demosntrate 5/5 right shoulder strength with good endurance in order to return to work.    Time 12   Period Weeks   Status New               Plan - 12/21/15 1712    Clinical Impression Statement Pt reports increased pain after eval due to starting exercises. Review of HEP and instructed pt in isometrics with 30 second holds for HEP. Joint mobs and PROM to tolerance.    PT Next Visit Plan Pt is at 8 weeks post op. Per protocol he can begin light stregthening but we will be conservative 2nd to his [facility] joint sepration and his self reported tendency to develop tendinitis. He was advised severalt times to let pain be his guide. Continue with HEP. Condsider isometrics. consdier serratus punch and rythmic stabilization at 90 degrees of flexion and neutral ER.       Patient will benefit from skilled therapeutic intervention in order to improve the following deficits and impairments:  Decreased activity tolerance, Decreased mobility, Decreased strength, Pain, Impaired tone  Visit Diagnosis: Pain in right shoulder  Muscle weakness  (generalized)  Stiffness of right shoulder, not elsewhere classified     Problem List Patient Active Problem List  Diagnosis Date Noted  . Headache 04/27/2015  . Anemia, iron deficiency 02/28/2015  . Arthritis 02/21/2015  . Orthostatic hypotension 02/21/2015  . Constipation 07/04/2014  . Preventative health care 01/07/2014  . Postoperative wound infection 06/16/2013  . Family history of prostate cancer 02/09/2013  . Fatigue 02/09/2013  . Low back pain 06/23/2012  . BENIGN POSITIONAL VERTIGO, HX OF 05/16/2009  . HYPERTENSION, MILD 07/26/2008  . EIC (epidermal inclusion cyst) 12/22/2007  . RESTLESS LEG SYNDROME, MILD 09/29/2007  . Sleep apnea 03/17/2007  . GLUCOSE INTOLERANCE 10/09/2006  . OBESITY, NOS 10/09/2006  . Depression 10/09/2006  . Allergic rhinitis 10/09/2006  . ASTHMA, INTERMITTENT 10/09/2006  . GASTROESOPHAGEAL REFLUX, NO ESOPHAGITIS 10/09/2006  . IRRITABLE BOWEL SYNDROME 10/09/2006    Dorene Ar, PTA 12/21/2015, 5:23 PM  [facility] [address] [place], [place], [address] Phone: [phone]   Fax:  [phone]  Name: Patrick Oconnor MRN: [id number] Date of Birth: 1966-02-14

## 2015-12-22 MED ORDER — ROPINIROLE HCL 0.25 MG PO TABS: 0.2500 mg | ORAL_TABLET | Freq: Every day | ORAL | Status: DC

## 2015-12-22 MED FILL — rOPINIRole HCL 0.25 MG TABS: 0.25 | 30 days supply | Qty: 30 | Fill #0

## 2015-12-26 ENCOUNTER — Ambulatory Visit: Payer: 59 | Admitting: Physical Therapy

## 2015-12-26 DIAGNOSIS — M25511 Pain in right shoulder: Secondary | ICD-10-CM | POA: Diagnosis not present

## 2015-12-26 DIAGNOSIS — M25611 Stiffness of right shoulder, not elsewhere classified: Secondary | ICD-10-CM | POA: Diagnosis not present

## 2015-12-26 DIAGNOSIS — M6281 Muscle weakness (generalized): Secondary | ICD-10-CM

## 2015-12-26 NOTE — Therapy (Signed)
[place], [place], [address] Phone: [phone]   Fax:  [phone]  Physical Therapy Treatment  Patient Details  Name: Patrick Oconnor MRN: [id number] Date of Birth: [birth year] Referring Provider: Dr Justice Britain   Encounter Date: 12/26/2015      PT End of Session - 12/26/15 1422    Visit Number 3   Number of Visits 24   Date for PT Re-Evaluation 03/13/16   Authorization Type [facility] employee    PT Start Time 0215   PT Stop Time 0315   PT Time Calculation (min) 60 min      Past Medical History  Diagnosis Date  . GERD (gastroesophageal reflux disease)   . Sleep apnea 9/08    Started CPAP; does not wear CPAP nightly  . PONV (postoperative nausea and vomiting)   . Hypertension   . Pneumonia     hx of  . Asthma, exercise induced   . IBS (irritable bowel syndrome)   . Environmental allergies   . Depression   . History of bronchitis   . Anemia   . Degenerative disc disease, lumbar     Past Surgical History  Procedure Laterality Date  . Pyloroplasty  infant    pyloric stenosis  . Tonsillectomy and adenoidectomy    . Pilonidal cystectomy  42003    x 2  . Thrombosed external hemorrhoid  9/08    incised  . Esophagogastroduodenoscopy  3/09    normal; multiple  . Colonoscopy  08/12/2005    "multiple"  . Acromio-clavicular joint repair Right 04/15/2013    Procedure: RIGHT SHOULDER ACROMIO-CLAVICULAR RECONSTRUCTION WITH ALLOGRAFT   ;  Surgeon: Marin Shutter, MD;  Location: [place];  Service: Orthopedics;  Laterality: Right;  . Septoplasty    . Incision and drainage Right 06/03/2013    Procedure: INCISION AND DRAINAGE RIGHT SHOULDER;  Surgeon: Marin Shutter, MD;  Location: [place];  Service: Orthopedics;  Laterality: Right;  . Acromio-clavicular joint repair Right 06/03/2013    Procedure: ACROMIO-CLAVICULAR JOINT REPAIR;  Surgeon: Marin Shutter, MD;  Location: [place];  Service: Orthopedics;   Laterality: Right;  . Shoulder open rotator cuff repair Right 10/19/2015    Procedure: OPEN RIGHT SHOULDER ROTATOR CUFF REPAIR ;  Surgeon: Justice Britain, MD;  Location: [place];  Service: Orthopedics;  Laterality: Right;  . Acromio-clavicular joint repair Right 10/19/2015    Procedure: OPEN RIGHT SHOULDER ACROMIO-CLAVICULAR JOINT RECONSTRUCTION ;  Surgeon: Justice Britain, MD;  Location: [place];  Service: Orthopedics;  Laterality: Right;    There were no vitals filed for this visit.      Subjective Assessment - 12/26/15 1422    Currently in Pain? Yes   Pain Score 2    Pain Location Shoulder   Pain Orientation Right   Pain Descriptors / Indicators Sore   Aggravating Factors  carrying casserole dish   Pain Relieving Factors rest, pain relievers            [facility] PT Assessment - 12/26/15 0001    AROM   Right/Left Shoulder Right   Right Shoulder Flexion --  135 AAROM   PROM   Right/Left Shoulder Right   Right Shoulder Flexion 145 Degrees   Right Shoulder ABduction 120 Degrees   Right Shoulder Internal Rotation 70 Degrees   Right Shoulder External Rotation 75 Degrees                     [facility]  Adult PT Treatment/Exercise - 12/26/15 0001    Shoulder Exercises: Supine   Protraction 15 reps   Other Supine Exercises tricep press AROM, 2# x 20, bicep curls 2#x 20 , green band for HEP   Other Supine Exercises supine cane pullovers and ER, horizontal abdct/addct x 10 ER x 20   Shoulder Exercises: Standing   External Rotation Strengthening;Right;15 reps;Theraband   Theraband Level (Shoulder External Rotation) Level 1 (Yellow)   Internal Rotation Strengthening;Right;15 reps;Theraband   Theraband Level (Shoulder Internal Rotation) Level 1 (Yellow)   Extension Strengthening;Both;20 reps;Theraband   Theraband Level (Shoulder Extension) Level 2 (Red)   Row Strengthening;Both;20 reps;Theraband   Theraband Level (Shoulder Row) Level 2 (Red)   Shoulder Exercises: Pulleys   Flexion 2  minutes   Shoulder Exercises: ROM/Strengthening   Other ROM/Strengthening Exercises Aerodyne-no resistance x 3 minutes forward, 3 minutes back   Cryotherapy   Number Minutes Cryotherapy 15 Minutes   Cryotherapy Location Shoulder   Type of Cryotherapy Ice pack   Manual Therapy   Joint Mobilization Grade 2 A/P and inferior to right GH jt    Passive ROM Flexion,ER, IR, abduction                   PT Short Term Goals - 12/26/15 1648    PT SHORT TERM GOAL #1   Title Pt's shoulder strength will be measured at 4/5    Time 6   Period Weeks   Status On-going   PT SHORT TERM GOAL #2   Title Pt will be I w/ HEP    Time 6   Period Weeks   Status Achieved   PT SHORT TERM GOAL #3   Title Pt will increase passive range of motion to 70 degrees   Time 6   Period Weeks   Status On-going   PT SHORT TERM GOAL #4   Title Pt will increase passive right shoulder flexion to 160 degrees    Time 6   Period Weeks   Status On-going           PT Long Term Goals - 12/20/15 0757    PT LONG TERM GOAL #1   Title Patient will reach overhead to a cabinet without pain in order to perfrom IADL's    Time 12   Period Weeks   Status New   PT LONG TERM GOAL #2   Title Patient will reach behind his back to t-8 in order to perfrom ADL's    Time 12   Period Weeks   Status New   PT LONG TERM GOAL #3   Title Patient will transfer patient with gait belt without increased pain in order to return to work.   Time 12   Period Weeks   Status New   PT LONG TERM GOAL #4   Title Pt will demosntrate 5/5 right shoulder strength with good endurance in order to return to work.    Time 12   Period Weeks   Status New               Plan - 12/26/15 1457    Clinical Impression Statement independent with initial HEP. STG# 2 Met. Progressed patient to ER and IR yellow bands at neutral with good tolerance. Also triceps and biceps green band for HEP all with no increased pain.    PT Home Exercise Plan  Supine wand flexion, and ER, Standing wand IR with wide grip; scap retraction to neutral  w/ yellow t-band; Shoulder  extension to neutral with yellow band. ER/IR yellow band, serratus punch, bicpe tricep green band      Patient will benefit from skilled therapeutic intervention in order to improve the following deficits and impairments:     Visit Diagnosis: Pain in right shoulder  Muscle weakness (generalized)  Stiffness of right shoulder, not elsewhere classified     Problem List Patient Active Problem List   Diagnosis Date Noted  . Headache 04/27/2015  . Anemia, iron deficiency 02/28/2015  . Arthritis 02/21/2015  . Orthostatic hypotension 02/21/2015  . Constipation 07/04/2014  . Preventative health care 01/07/2014  . Postoperative wound infection 06/16/2013  . Family history of prostate cancer 02/09/2013  . Fatigue 02/09/2013  . Low back pain 06/23/2012  . BENIGN POSITIONAL VERTIGO, HX OF 05/16/2009  . HYPERTENSION, MILD 07/26/2008  . EIC (epidermal inclusion cyst) 12/22/2007  . RESTLESS LEG SYNDROME, MILD 09/29/2007  . Sleep apnea 03/17/2007  . GLUCOSE INTOLERANCE 10/09/2006  . OBESITY, NOS 10/09/2006  . Depression 10/09/2006  . Allergic rhinitis 10/09/2006  . ASTHMA, INTERMITTENT 10/09/2006  . GASTROESOPHAGEAL REFLUX, NO ESOPHAGITIS 10/09/2006  . IRRITABLE BOWEL SYNDROME 10/09/2006    Dorene Ar , PTA  12/26/2015, 4:54 PM  [facility] [address] [place], [place], [address] Phone: [phone]   Fax:  [phone]  Name: Patrick Oconnor MRN: [id number] Date of Birth: 1965/12/05

## 2015-12-28 ENCOUNTER — Ambulatory Visit: Payer: 59 | Admitting: Physical Therapy

## 2015-12-28 DIAGNOSIS — M25511 Pain in right shoulder: Secondary | ICD-10-CM | POA: Diagnosis not present

## 2015-12-28 DIAGNOSIS — M25611 Stiffness of right shoulder, not elsewhere classified: Secondary | ICD-10-CM | POA: Diagnosis not present

## 2015-12-28 DIAGNOSIS — M6281 Muscle weakness (generalized): Secondary | ICD-10-CM

## 2015-12-28 NOTE — Therapy (Signed)
[place], [place], [address] Phone: [phone]   Fax:  [phone]  Physical Therapy Treatment  Patient Details  Name: Patrick Oconnor MRN: [id number] Date of Birth: 21-Aug-1965 Referring Provider: Dr Justice Britain   Encounter Date: 12/28/2015      PT End of Session - 12/28/15 1420    Visit Number 4   Number of Visits 24   Date for PT Re-Evaluation 03/13/16   Authorization Type [facility] employee    PT Start Time 0216   PT Stop Time 0315   PT Time Calculation (min) 59 min      Past Medical History  Diagnosis Date  . GERD (gastroesophageal reflux disease)   . Sleep apnea 9/08    Started CPAP; does not wear CPAP nightly  . PONV (postoperative nausea and vomiting)   . Hypertension   . Pneumonia     hx of  . Asthma, exercise induced   . IBS (irritable bowel syndrome)   . Environmental allergies   . Depression   . History of bronchitis   . Anemia   . Degenerative disc disease, lumbar     Past Surgical History  Procedure Laterality Date  . Pyloroplasty  infant    pyloric stenosis  . Tonsillectomy and adenoidectomy    . Pilonidal cystectomy  42003    x 2  . Thrombosed external hemorrhoid  9/08    incised  . Esophagogastroduodenoscopy  3/09    normal; multiple  . Colonoscopy  08/12/2005    "multiple"  . Acromio-clavicular joint repair Right 04/15/2013    Procedure: RIGHT SHOULDER ACROMIO-CLAVICULAR RECONSTRUCTION WITH ALLOGRAFT   ;  Surgeon: Marin Shutter, MD;  Location: [place];  Service: Orthopedics;  Laterality: Right;  . Septoplasty    . Incision and drainage Right 06/03/2013    Procedure: INCISION AND DRAINAGE RIGHT SHOULDER;  Surgeon: Marin Shutter, MD;  Location: [place];  Service: Orthopedics;  Laterality: Right;  . Acromio-clavicular joint repair Right 06/03/2013    Procedure: ACROMIO-CLAVICULAR JOINT REPAIR;  Surgeon: Marin Shutter, MD;  Location: [place];  Service: Orthopedics;   Laterality: Right;  . Shoulder open rotator cuff repair Right 10/19/2015    Procedure: OPEN RIGHT SHOULDER ROTATOR CUFF REPAIR ;  Surgeon: Justice Britain, MD;  Location: [place];  Service: Orthopedics;  Laterality: Right;  . Acromio-clavicular joint repair Right 10/19/2015    Procedure: OPEN RIGHT SHOULDER ACROMIO-CLAVICULAR JOINT RECONSTRUCTION ;  Surgeon: Justice Britain, MD;  Location: [place];  Service: Orthopedics;  Laterality: Right;    There were no vitals filed for this visit.      Subjective Assessment - 12/28/15 1419    Currently in Pain? No/denies                         [facility] Adult PT Treatment/Exercise - 12/28/15 0001    Shoulder Exercises: Supine   Horizontal ABduction Both;15 reps;Theraband   Theraband Level (Shoulder Horizontal ABduction) Level 1 (Yellow)   External Rotation 10 reps   Theraband Level (Shoulder External Rotation) Level 1 (Yellow)   Flexion AROM;10 reps  elbow bent   Theraband Level (Shoulder Flexion) Level 1 (Yellow)  then flexion in pain free ROM-90-120 with yellow   Shoulder Exercises: Sidelying   External Rotation Right;10 reps   Shoulder Exercises: Standing   Internal Rotation Strengthening;Right;15 reps;Theraband   Theraband Level (Shoulder Internal Rotation) Level 1 (Yellow)   Flexion AAROM  Flexion Limitations wooden dowel x5   ABduction AAROM   ABduction Limitations wooden dowel x 5   Extension Strengthening;Both;20 reps;Theraband   Theraband Level (Shoulder Extension) Level 2 (Red)   Row Strengthening;Both;20 reps;Theraband   Theraband Level (Shoulder Row) Level 3 (Green)   Shoulder Exercises: Pulleys   Flexion 3 minutes   Flexion Limitations with scapular guidance   Shoulder Exercises: ROM/Strengthening   UBE (Upper Arm Bike) L1 3 minutes forward, 3 minutes back    Other ROM/Strengthening Exercises UE ranger flexion x 10., then used on floor for IR 10 x2 in painfree ROM- IR improved to L spine after    Cryotherapy   Number  Minutes Cryotherapy 15 Minutes   Cryotherapy Location Shoulder   Type of Cryotherapy Ice pack   Manual Therapy   Joint Mobilization Grade 2/3 A/P and inferior to right GH jt , sidelying scap mobs   Passive ROM Flexion,ER, IR, abduction                   PT Short Term Goals - 12/26/15 1648    PT SHORT TERM GOAL #1   Title Pt's shoulder strength will be measured at 4/5    Time 6   Period Weeks   Status On-going   PT SHORT TERM GOAL #2   Title Pt will be I w/ HEP    Time 6   Period Weeks   Status Achieved   PT SHORT TERM GOAL #3   Title Pt will increase passive range of motion to 70 degrees   Time 6   Period Weeks   Status On-going   PT SHORT TERM GOAL #4   Title Pt will increase passive right shoulder flexion to 160 degrees    Time 6   Period Weeks   Status On-going           PT Long Term Goals - 12/20/15 0757    PT LONG TERM GOAL #1   Title Patient will reach overhead to a cabinet without pain in order to perfrom IADL's    Time 12   Period Weeks   Status New   PT LONG TERM GOAL #2   Title Patient will reach behind his back to t-8 in order to perfrom ADL's    Time 12   Period Weeks   Status New   PT LONG TERM GOAL #3   Title Patient will transfer patient with gait belt without increased pain in order to return to work.   Time 12   Period Weeks   Status New   PT LONG TERM GOAL #4   Title Pt will demosntrate 5/5 right shoulder strength with good endurance in order to return to work.    Time 12   Period Weeks   Status New               Plan - 12/28/15 1516    Clinical Impression Statement Review of yellow bands with addition of horizontal abduction and small arc yellow bands for flexion in pain free ROM. Used UE ranger to improve IR reach behind back as well as added wooden dowel for standing AAROM. Improved reach to Lspine post treatment. Initially only reaching to right buttock at beginning of treatment.      PT Next Visit Plan Continue per  protocol and pain.  Check goals      Patient will benefit from skilled therapeutic intervention in order to improve the following deficits and impairments:  Decreased activity tolerance,  Decreased mobility, Decreased strength, Pain, Impaired tone  Visit Diagnosis: Pain in right shoulder  Muscle weakness (generalized)  Stiffness of right shoulder, not elsewhere classified     Problem List Patient Active Problem List   Diagnosis Date Noted  . Headache 04/27/2015  . Anemia, iron deficiency 02/28/2015  . Arthritis 02/21/2015  . Orthostatic hypotension 02/21/2015  . Constipation 07/04/2014  . Preventative health care 01/07/2014  . Postoperative wound infection 06/16/2013  . Family history of prostate cancer 02/09/2013  . Fatigue 02/09/2013  . Low back pain 06/23/2012  . BENIGN POSITIONAL VERTIGO, HX OF 05/16/2009  . HYPERTENSION, MILD 07/26/2008  . EIC (epidermal inclusion cyst) 12/22/2007  . RESTLESS LEG SYNDROME, MILD 09/29/2007  . Sleep apnea 03/17/2007  . GLUCOSE INTOLERANCE 10/09/2006  . OBESITY, NOS 10/09/2006  . Depression 10/09/2006  . Allergic rhinitis 10/09/2006  . ASTHMA, INTERMITTENT 10/09/2006  . GASTROESOPHAGEAL REFLUX, NO ESOPHAGITIS 10/09/2006  . IRRITABLE BOWEL SYNDROME 10/09/2006    Dorene Ar, PTA 12/28/2015, 3:20 PM  [facility] [address] [place], [place], [address] Phone: [phone]   Fax:  [phone]  Name: Patrick Oconnor MRN: [id number] Date of Birth: February 29, 1966

## 2016-01-02 ENCOUNTER — Ambulatory Visit: Payer: 59 | Admitting: Physical Therapy

## 2016-01-02 DIAGNOSIS — M25511 Pain in right shoulder: Secondary | ICD-10-CM | POA: Diagnosis not present

## 2016-01-02 DIAGNOSIS — M25611 Stiffness of right shoulder, not elsewhere classified: Secondary | ICD-10-CM | POA: Diagnosis not present

## 2016-01-02 DIAGNOSIS — M6281 Muscle weakness (generalized): Secondary | ICD-10-CM | POA: Diagnosis not present

## 2016-01-05 ENCOUNTER — Encounter: Payer: 59 | Admitting: Physical Therapy

## 2016-01-05 NOTE — Therapy (Signed)
[place], [place], [address] Phone: [phone]   Fax:  [phone]  Physical Therapy Treatment  Patient Details  Name: Patrick Oconnor MRN: [id number] Date of Birth: 10/14/1965 Referring Provider: Dr Justice Britain   Encounter Date: 01/02/2016      PT End of Session - 01/05/16 1131    Visit Number 5   Number of Visits 24   Date for PT Re-Evaluation 03/13/16   Authorization Type [facility] employee    PT Start Time 1100   PT Stop Time 1150   PT Time Calculation (min) 50 min   Activity Tolerance Patient tolerated treatment well   Behavior During Therapy [facility] for tasks assessed/performed      Past Medical History  Diagnosis Date  . GERD (gastroesophageal reflux disease)   . Sleep apnea 9/08    Started CPAP; does not wear CPAP nightly  . PONV (postoperative nausea and vomiting)   . Hypertension   . Pneumonia     hx of  . Asthma, exercise induced   . IBS (irritable bowel syndrome)   . Environmental allergies   . Depression   . History of bronchitis   . Anemia   . Degenerative disc disease, lumbar     Past Surgical History  Procedure Laterality Date  . Pyloroplasty  infant    pyloric stenosis  . Tonsillectomy and adenoidectomy    . Pilonidal cystectomy  42003    x 2  . Thrombosed external hemorrhoid  9/08    incised  . Esophagogastroduodenoscopy  3/09    normal; multiple  . Colonoscopy  08/12/2005    "multiple"  . Acromio-clavicular joint repair Right 04/15/2013    Procedure: RIGHT SHOULDER ACROMIO-CLAVICULAR RECONSTRUCTION WITH ALLOGRAFT   ;  Surgeon: Marin Shutter, MD;  Location: [place];  Service: Orthopedics;  Laterality: Right;  . Septoplasty    . Incision and drainage Right 06/03/2013    Procedure: INCISION AND DRAINAGE RIGHT SHOULDER;  Surgeon: Marin Shutter, MD;  Location: [place];  Service: Orthopedics;  Laterality: Right;  . Acromio-clavicular joint repair Right 06/03/2013    Procedure:  ACROMIO-CLAVICULAR JOINT REPAIR;  Surgeon: Marin Shutter, MD;  Location: [place];  Service: Orthopedics;  Laterality: Right;  . Shoulder open rotator cuff repair Right 10/19/2015    Procedure: OPEN RIGHT SHOULDER ROTATOR CUFF REPAIR ;  Surgeon: Justice Britain, MD;  Location: [place];  Service: Orthopedics;  Laterality: Right;  . Acromio-clavicular joint repair Right 10/19/2015    Procedure: OPEN RIGHT SHOULDER ACROMIO-CLAVICULAR JOINT RECONSTRUCTION ;  Surgeon: Justice Britain, MD;  Location: [place];  Service: Orthopedics;  Laterality: Right;    There were no vitals filed for this visit.      Subjective Assessment - 01/05/16 1129    Subjective Patient reports he may be doing a little too much. Therapy advised him to moderate his activity and nothing overhead at this time. He reports no pain today   Limitations Lifting;Other (comment)   How long can you sit comfortably? N/A   How long can you stand comfortably? N/A    How long can you walk comfortably? N/A    Diagnostic tests nothing post op    Patient Stated Goals Get back to work    Currently in Pain? No/denies                         [facility] Adult PT Treatment/Exercise - 01/05/16 0001  Shoulder Exercises: Supine   Horizontal ABduction Both;15 reps;Theraband   Theraband Level (Shoulder Horizontal ABduction) Level 1 (Yellow)   External Rotation 10 reps   Theraband Level (Shoulder External Rotation) Level 1 (Yellow)   Flexion AROM;10 reps  elbow bent   Theraband Level (Shoulder Flexion) Level 1 (Yellow)  then flexion in pain free ROM-90-120 with yellow   Shoulder Exercises: Prone   Retraction Limitations 2x10 2lb    Extension 20 reps  2lb   Shoulder Exercises: Standing   Internal Rotation Strengthening;Right;15 reps;Theraband   Theraband Level (Shoulder Internal Rotation) Level 1 (Yellow)   Flexion AAROM   Flexion Limitations wooden dowel x5   ABduction AAROM   ABduction Limitations wooden dowel x 5   Extension  Strengthening;Both;20 reps;Theraband   Theraband Level (Shoulder Extension) Level 2 (Red)   Row Strengthening;Both;20 reps;Theraband   Theraband Level (Shoulder Row) Level 3 (Green)   Shoulder Exercises: Pulleys   Flexion 3 minutes   Flexion Limitations with scapular guidance   Shoulder Exercises: ROM/Strengthening   UBE (Upper Arm Bike) L1 3 minutes forward, 3 minutes back    Cryotherapy   Cryotherapy Location Shoulder   Manual Therapy   Joint Mobilization Grade 2/3 A/P and inferior to right GH jt , sidelying scap mobs   Passive ROM Flexion,ER, IR, abduction                 PT Education - 01/05/16 1130    Education Details Updated HEP and advised the patient on activity modification and the improtance of not doing too much with his shoulder.    Person(s) Educated Patient   Methods Explanation   Comprehension Verbalized understanding          PT Short Term Goals - 12/26/15 1648    PT SHORT TERM GOAL #1   Title Pt's shoulder strength will be measured at 4/5    Time 6   Period Weeks   Status On-going   PT SHORT TERM GOAL #2   Title Pt will be I w/ HEP    Time 6   Period Weeks   Status Achieved   PT SHORT TERM GOAL #3   Title Pt will increase passive range of motion to 70 degrees   Time 6   Period Weeks   Status On-going   PT SHORT TERM GOAL #4   Title Pt will increase passive right shoulder flexion to 160 degrees    Time 6   Period Weeks   Status On-going           PT Long Term Goals - 12/20/15 0757    PT LONG TERM GOAL #1   Title Patient will reach overhead to a cabinet without pain in order to perfrom IADL's    Time 12   Period Weeks   Status New   PT LONG TERM GOAL #2   Title Patient will reach behind his back to t-8 in order to perfrom ADL's    Time 12   Period Weeks   Status New   PT LONG TERM GOAL #3   Title Patient will transfer patient with gait belt without increased pain in order to return to work.   Time 12   Period Weeks   Status  New   PT LONG TERM GOAL #4   Title Pt will demosntrate 5/5 right shoulder strength with good endurance in order to return to work.    Time 12   Period Weeks   Status New  Plan - 01/05/16 1132    Clinical Impression Statement Patient is making good progress. Per visual inspection he is still lacking some flexion and external rotation but it is improving. His strength is progressing as exepcted. he was advised to continue with his HEP.    PT Frequency 2x / week   PT Duration 8 weeks   PT Treatment/Interventions ADLs/Self Care Home Management;Moist Heat;Electrical Stimulation;Ultrasound;Functional mobility training;Therapeutic exercise;Manual techniques;Patient/family education;Therapeutic activities   PT Next Visit Plan Continue per protocol and pain.    PT Home Exercise Plan Supine wand flexion, and ER, Standing wand IR with wide grip; scap retraction to neutral  w/ yellow t-band; Shoulder extension to neutral with yellow band. ER/IR yellow band, serratus punch, bicpe tricep green band, prone extension, side lying ER, Prne retraction    Consulted and Agree with Plan of Care Patient      Patient will benefit from skilled therapeutic intervention in order to improve the following deficits and impairments:  Decreased activity tolerance, Decreased mobility, Decreased strength, Pain, Impaired tone  Visit Diagnosis: Pain in right shoulder  Muscle weakness (generalized)  Stiffness of right shoulder, not elsewhere classified     Problem List Patient Active Problem List   Diagnosis Date Noted  . Headache 04/27/2015  . Anemia, iron deficiency 02/28/2015  . Arthritis 02/21/2015  . Orthostatic hypotension 02/21/2015  . Constipation 07/04/2014  . Preventative health care 01/07/2014  . Postoperative wound infection 06/16/2013  . Family history of prostate cancer 02/09/2013  . Fatigue 02/09/2013  . Low back pain 06/23/2012  . BENIGN POSITIONAL VERTIGO, HX OF  05/16/2009  . HYPERTENSION, MILD 07/26/2008  . EIC (epidermal inclusion cyst) 12/22/2007  . RESTLESS LEG SYNDROME, MILD 09/29/2007  . Sleep apnea 03/17/2007  . GLUCOSE INTOLERANCE 10/09/2006  . OBESITY, NOS 10/09/2006  . Depression 10/09/2006  . Allergic rhinitis 10/09/2006  . ASTHMA, INTERMITTENT 10/09/2006  . GASTROESOPHAGEAL REFLUX, NO ESOPHAGITIS 10/09/2006  . IRRITABLE BOWEL SYNDROME 10/09/2006    Carney Living PT DPT  01/05/2016, 11:34 AM  [facility] [address] [place], [place], [address] Phone: [phone]   Fax:  [phone]  Name: Patrick Oconnor MRN: [id number] Date of Birth: [birth year]

## 2016-01-10 ENCOUNTER — Encounter (HOSPITAL_COMMUNITY): Payer: Self-pay | Admitting: *Deleted

## 2016-01-10 ENCOUNTER — Ambulatory Visit: Payer: 59 | Admitting: Physical Therapy

## 2016-01-10 DIAGNOSIS — M25511 Pain in right shoulder: Secondary | ICD-10-CM

## 2016-01-10 DIAGNOSIS — M25611 Stiffness of right shoulder, not elsewhere classified: Secondary | ICD-10-CM | POA: Diagnosis not present

## 2016-01-10 DIAGNOSIS — M6281 Muscle weakness (generalized): Secondary | ICD-10-CM

## 2016-01-10 NOTE — Therapy (Signed)
[place], [place], [address] Phone: [phone]   Fax:  [phone]  Physical Therapy Treatment  Patient Details  Name: Patrick Oconnor MRN: [id number] Date of Birth: 1966/02/10 Referring Provider: Dr Justice Britain   Encounter Date: 01/10/2016      PT End of Session - 01/10/16 1701    Visit Number 6   Number of Visits 24   Date for PT Re-Evaluation 03/13/16   Authorization Type [employer] employee    PT Start Time 1509   PT Stop Time 1550   PT Time Calculation (min) 41 min   Activity Tolerance Patient tolerated treatment well   Behavior During Therapy [facility] for tasks assessed/performed      Past Medical History  Diagnosis Date  . GERD (gastroesophageal reflux disease)   . Sleep apnea 9/08    Started CPAP; does not wear CPAP nightly  . PONV (postoperative nausea and vomiting)   . Hypertension   . Pneumonia     hx of  . Asthma, exercise induced   . IBS (irritable bowel syndrome)   . Environmental allergies   . Depression   . History of bronchitis   . Anemia   . Degenerative disc disease, lumbar     Past Surgical History  Procedure Laterality Date  . Pyloroplasty  infant    pyloric stenosis  . Tonsillectomy and adenoidectomy    . Pilonidal cystectomy  42003    x 2  . Thrombosed external hemorrhoid  9/08    incised  . Esophagogastroduodenoscopy  3/09    normal; multiple  . Colonoscopy  08/12/2005    "multiple"  . Acromio-clavicular joint repair Right 04/15/2013    Procedure: RIGHT SHOULDER ACROMIO-CLAVICULAR RECONSTRUCTION WITH ALLOGRAFT   ;  Surgeon: Marin Shutter, MD;  Location: [place];  Service: Orthopedics;  Laterality: Right;  . Septoplasty    . Incision and drainage Right 06/03/2013    Procedure: INCISION AND DRAINAGE RIGHT SHOULDER;  Surgeon: Marin Shutter, MD;  Location: [place];  Service: Orthopedics;  Laterality: Right;  . Acromio-clavicular joint repair Right 06/03/2013    Procedure:  ACROMIO-CLAVICULAR JOINT REPAIR;  Surgeon: Marin Shutter, MD;  Location: [place];  Service: Orthopedics;  Laterality: Right;  . Shoulder open rotator cuff repair Right 10/19/2015    Procedure: OPEN RIGHT SHOULDER ROTATOR CUFF REPAIR ;  Surgeon: Justice Britain, MD;  Location: [place];  Service: Orthopedics;  Laterality: Right;  . Acromio-clavicular joint repair Right 10/19/2015    Procedure: OPEN RIGHT SHOULDER ACROMIO-CLAVICULAR JOINT RECONSTRUCTION ;  Surgeon: Justice Britain, MD;  Location: [place];  Service: Orthopedics;  Laterality: Right;    There were no vitals filed for this visit.      Subjective Assessment - 01/10/16 1653    Subjective Patient treported some soreness after the last visit but nothing significant. He has been feeling more fatigue and lack of endurance then pain inhis shoulder. He is doing his exercises. He is scheduled to retunr to work in 2 weeks.    Pertinent History Pt has a history of AC joint reconstrutions. He has fallen skiing on 2 other occasions and suffered seprations. He also has a RTC repair on 10/19/15/ Since that point he is out of his sling. He has been doing pendulums at home. He is having difficultys performing ADL's with his non-dominant hand .    Limitations Lifting;Other (comment)   How long can you sit comfortably? N/A   How long can  you stand comfortably? N/A    How long can you walk comfortably? N/A    Diagnostic tests nothing post op    Patient Stated Goals Get back to work    Currently in Pain? No/denies                         [facility] Adult PT Treatment/Exercise - 01/10/16 0001    Shoulder Exercises: Supine   Flexion --  elbow bent   Theraband Level (Shoulder Flexion) --  then flexion in pain free ROM-90-120 with yellow   Shoulder Exercises: Prone   Retraction Limitations 2x10 2lb    Extension 20 reps  2lb   Horizontal ABduction 1 Limitations x20   Shoulder Exercises: Sidelying   External Rotation Right;10 reps   Shoulder Exercises:  Standing   Internal Rotation Strengthening;Right;15 reps;Theraband   Theraband Level (Shoulder Internal Rotation) Level 3 (Green)   Flexion Limitations Standing in front of mirror 2x10    ABduction Limitations wooden dowel x 5   Extension Strengthening;Both;20 reps;Theraband   Theraband Level (Shoulder Extension) Level 2 (Red)   Row Strengthening;Both;20 reps;Theraband   Theraband Level (Shoulder Row) Level 3 (Green)   Other Standing Exercises Standing external rotation 2x10 w/ green band    Shoulder Exercises: ROM/Strengthening   UBE (Upper Arm Bike) L1 3 minutes forward, 3 minutes back    Cryotherapy   Number Minutes Cryotherapy 15 Minutes   Cryotherapy Location Shoulder   Type of Cryotherapy Ice pack   Manual Therapy   Joint Mobilization Grade 2/3 A/P and inferior to right GH jt , sidelying scap mobs   Passive ROM Flexion,ER, IR, abduction                 PT Education - 01/10/16 1700    Education Details updated HEP. continued to educate the patient on symptom mangement.    Methods Explanation   Comprehension Verbalized understanding          PT Short Term Goals - 01/10/16 1726    PT SHORT TERM GOAL #1   Title Pt's shoulder strength will be measured at 4/5    Baseline 4/5 in flexionn    Time 6   Period Weeks   Status Achieved   PT SHORT TERM GOAL #2   Title Pt will be I w/ HEP    Time 6   Period Weeks   Status Achieved   PT SHORT TERM GOAL #3   Title Pt will increase passive right shoulder flexion range of motion to 70 degrees   Baseline 165 degrees    Status Achieved   PT SHORT TERM GOAL #4   Title Pt will increase passive right shoulder flexion to 160 degrees    Baseline 145 degrees    Time 6   Period Weeks   Status Achieved           PT Long Term Goals - 01/10/16 1727    PT LONG TERM GOAL #1   Title Patient will reach overhead to a cabinet without pain in order to perfrom IADL's    Baseline started active flexion today    Time 12   Period  Weeks   Status On-going   PT LONG TERM GOAL #2   Title Patient will reach behind his back to t-8 in order to perfrom ADL's    Baseline still some resistance felt when reaching behind the back    Time 12   Period Weeks  Status On-going   PT LONG TERM GOAL #3   Title Patient will transfer patient with gait belt without increased pain in order to return to work.   Time 12   Period Weeks   Status New   PT LONG TERM GOAL #4   Title Pt will demosntrate 5/5 right shoulder strength with good endurance in order to return to work.    Time 12   Period Weeks   Status New               Plan - 01/10/16 1705    Clinical Impression Statement Patient continues to make great progress. He was able to flex his shoulder past 90 degrees in standing today. He report fatigue and muscle burn but no shoulder pain. He is approaching all goals.    PT Frequency 2x / week   PT Duration 8 weeks   PT Treatment/Interventions ADLs/Self Care Home Management;Moist Heat;Electrical Stimulation;Ultrasound;Functional mobility training;Therapeutic exercise;Manual techniques;Patient/family education;Therapeutic activities   PT Next Visit Plan Continue per protocol and pain.    PT Home Exercise Plan Supine wand flexion, and ER, Standing wand IR with wide grip; scap retraction to neutral  w/ yellow t-band; Shoulder extension to neutral with yellow band. ER/IR yellow band, serratus punch, bicpe tricep green band, prone extension, side lying ER, Prne retraction    Consulted and Agree with Plan of Care Patient      Patient will benefit from skilled therapeutic intervention in order to improve the following deficits and impairments:  Decreased activity tolerance, Decreased mobility, Decreased strength, Pain, Impaired tone  Visit Diagnosis: Pain in right shoulder  Muscle weakness (generalized)  Stiffness of right shoulder, not elsewhere classified     Problem List Patient Active Problem List   Diagnosis Date  Noted  . Headache 04/27/2015  . Anemia, iron deficiency 02/28/2015  . Arthritis 02/21/2015  . Orthostatic hypotension 02/21/2015  . Constipation 07/04/2014  . Preventative health care 01/07/2014  . Postoperative wound infection 06/16/2013  . Family history of prostate cancer 02/09/2013  . Fatigue 02/09/2013  . Low back pain 06/23/2012  . BENIGN POSITIONAL VERTIGO, HX OF 05/16/2009  . HYPERTENSION, MILD 07/26/2008  . EIC (epidermal inclusion cyst) 12/22/2007  . RESTLESS LEG SYNDROME, MILD 09/29/2007  . Sleep apnea 03/17/2007  . GLUCOSE INTOLERANCE 10/09/2006  . OBESITY, NOS 10/09/2006  . Depression 10/09/2006  . Allergic rhinitis 10/09/2006  . ASTHMA, INTERMITTENT 10/09/2006  . GASTROESOPHAGEAL REFLUX, NO ESOPHAGITIS 10/09/2006  . IRRITABLE BOWEL SYNDROME 10/09/2006    Carney Living  PT DPT   01/10/2016, 5:32 PM  [facility] [address] [place], [place], [address] Phone: [phone]   Fax:  [phone]  Name: Patrick Oconnor MRN: [id number] Date of Birth: March 03, 1966

## 2016-01-11 ENCOUNTER — Ambulatory Visit: Payer: 59 | Attending: Orthopedic Surgery | Admitting: Physical Therapy

## 2016-01-11 DIAGNOSIS — M25511 Pain in right shoulder: Secondary | ICD-10-CM | POA: Insufficient documentation

## 2016-01-11 DIAGNOSIS — M6281 Muscle weakness (generalized): Secondary | ICD-10-CM | POA: Diagnosis not present

## 2016-01-11 DIAGNOSIS — M25611 Stiffness of right shoulder, not elsewhere classified: Secondary | ICD-10-CM | POA: Insufficient documentation

## 2016-01-11 NOTE — Therapy (Signed)
[place], [place], [address] Phone: [phone]   Fax:  [phone]  Physical Therapy Treatment  Patient Details  Name: Patrick Oconnor MRN: [id number] Date of Birth: 12/30/1965 Referring Provider: Dr Justice Britain   Encounter Date: 01/11/2016      PT End of Session - 01/11/16 1557    Visit Number 7   Number of Visits 24   Date for PT Re-Evaluation 03/13/16   PT Start Time [id number]   PT Stop Time 1455   PT Time Calculation (min) 40 min   Activity Tolerance Patient tolerated treatment well   Behavior During Therapy [facility] for tasks assessed/performed      Past Medical History  Diagnosis Date  . GERD (gastroesophageal reflux disease)   . Sleep apnea 9/08    Started CPAP; does not wear CPAP nightly  . PONV (postoperative nausea and vomiting)   . Hypertension   . Pneumonia     hx of  . Asthma, exercise induced   . IBS (irritable bowel syndrome)   . Environmental allergies   . Depression   . History of bronchitis   . Anemia   . Degenerative disc disease, lumbar     Past Surgical History  Procedure Laterality Date  . Pyloroplasty  infant    pyloric stenosis  . Tonsillectomy and adenoidectomy    . Pilonidal cystectomy  42003    x 2  . Thrombosed external hemorrhoid  9/08    incised  . Esophagogastroduodenoscopy  3/09    normal; multiple  . Colonoscopy  08/12/2005    "multiple"  . Acromio-clavicular joint repair Right 04/15/2013    Procedure: RIGHT SHOULDER ACROMIO-CLAVICULAR RECONSTRUCTION WITH ALLOGRAFT   ;  Surgeon: Marin Shutter, MD;  Location: [place];  Service: Orthopedics;  Laterality: Right;  . Septoplasty    . Incision and drainage Right 06/03/2013    Procedure: INCISION AND DRAINAGE RIGHT SHOULDER;  Surgeon: Marin Shutter, MD;  Location: [place];  Service: Orthopedics;  Laterality: Right;  . Acromio-clavicular joint repair Right 06/03/2013    Procedure: ACROMIO-CLAVICULAR JOINT REPAIR;   Surgeon: Marin Shutter, MD;  Location: [place];  Service: Orthopedics;  Laterality: Right;  . Shoulder open rotator cuff repair Right 10/19/2015    Procedure: OPEN RIGHT SHOULDER ROTATOR CUFF REPAIR ;  Surgeon: Justice Britain, MD;  Location: [place];  Service: Orthopedics;  Laterality: Right;  . Acromio-clavicular joint repair Right 10/19/2015    Procedure: OPEN RIGHT SHOULDER ACROMIO-CLAVICULAR JOINT RECONSTRUCTION ;  Surgeon: Justice Britain, MD;  Location: [place];  Service: Orthopedics;  Laterality: Right;    There were no vitals filed for this visit.      Subjective Assessment - 01/11/16 1548    Subjective nNo soreness after yesterday. Shoulder just feels a little stiff.    Pertinent History Pt has a history of AC joint reconstrutions. He has fallen skiing on 2 other occasions and suffered seprations. He also has a RTC repair on 10/19/15/ Since that point he is out of his sling. He has been doing pendulums at home. He is having difficultys performing ADL's with his non-dominant hand .    Limitations Lifting;Other (comment)   How long can you sit comfortably? N/A   How long can you stand comfortably? N/A    How long can you walk comfortably? N/A    Diagnostic tests nothing post op    Patient Stated Goals Get back to work    Currently in  Pain? No/denies                         [facility] Adult PT Treatment/Exercise - 01/11/16 0001    Shoulder Exercises: Standing   Internal Rotation Strengthening;Right;15 reps;Theraband   Theraband Level (Shoulder Internal Rotation) Level 3 (Green)   Flexion Limitations Standing in front of mirror 2x10    ABduction Limitations wooden dowel x 5   Extension Strengthening;Both;20 reps;Theraband   Theraband Level (Shoulder Extension) Level 2 (Red)   Row Strengthening;Both;20 reps;Theraband   Theraband Level (Shoulder Row) Level 3 (Green)   Other Standing Exercises Standing external rotation 2x10 w/ green band    Other Standing Exercises Yellow band wall  walk 3 laps; yellow band wall clock x10; ball v wall 2x10    Cryotherapy   Number Minutes Cryotherapy 15 Minutes   Cryotherapy Location Shoulder   Type of Cryotherapy Ice pack   Manual Therapy   Joint Mobilization Grade 2/3 A/P and inferior to right GH jt , sidelying scap mobs   Passive ROM Flexion,ER, IR, abduction                 PT Education - 01/10/16 1700    Education Details updated HEP. continued to educate the patient on symptom mangement.    Methods Explanation   Comprehension Verbalized understanding          PT Short Term Goals - 01/11/16 1609    PT SHORT TERM GOAL #1   Title Pt's shoulder strength will be measured at 4/5    Baseline 4/5 in flexion    Time 6   Period Weeks   Status Achieved   PT SHORT TERM GOAL #2   Title Pt will be I w/ HEP    Time 6   Period Weeks   Status Achieved   PT SHORT TERM GOAL #3   Title Pt will increase passive right shoulder flexion range of motion to 70 degrees   Baseline 165 degrees    Time 6   Period Weeks   Status Achieved   PT SHORT TERM GOAL #4   Title Pt will increase passive right shoulder flexion to 160 degrees    Baseline 165 degrees    Time 6   Period Weeks   Status Achieved           PT Long Term Goals - 01/11/16 1612    PT LONG TERM GOAL #1   Title Patient will reach overhead to a cabinet without pain in order to perfrom IADL's    Baseline started active flexion today    Time 12   Period Weeks   Status On-going   PT LONG TERM GOAL #2   Title Patient will reach behind his back to t-8 in order to perfrom ADL's    Baseline t-10 today    Time 12   Period Weeks   Status On-going   PT LONG TERM GOAL #3   Title Patient will transfer patient with gait belt without increased pain in order to return to work.   Time 12   Period Weeks   Status On-going   PT LONG TERM GOAL #4   Title Pt will demosntrate 5/5 right shoulder strength with good endurance in order to return to work.    Time 12   Period  Weeks   Status On-going               Plan - 01/11/16 1606  Clinical Impression Statement Patient plans on coming for 1 more visit. he is progresisng well. He continues to report some stress when perforing IR but he was able to reach t-10. He still has a slight restriction in sttraight plane flexion motion. He is able to perfrom standing flexion through almost his full range.    PT Frequency 2x / week   PT Duration 8 weeks   PT Treatment/Interventions ADLs/Self Care Home Management;Moist Heat;Electrical Stimulation;Ultrasound;Functional mobility training;Therapeutic exercise;Manual techniques;Patient/family education;Therapeutic activities   PT Next Visit Plan Continue per protocol and pain. Assess tolerance to endurance exercises; FOTO next visit.    PT Home Exercise Plan Supine wand flexion, and ER, Standing wand IR with wide grip; scap retraction to neutral  w/ yellow t-band; Shoulder extension to neutral with yellow band. ER/IR yellow band, serratus punch, bicpe tricep green band, prone extension, side lying ER, Prone retraction ; yelllow band wall walk, Yellow band wall clock ball v wall    Consulted and Agree with Plan of Care Patient      Patient will benefit from skilled therapeutic intervention in order to improve the following deficits and impairments:  Decreased activity tolerance, Decreased mobility, Decreased strength, Pain, Impaired tone  Visit Diagnosis: Pain in right shoulder  Muscle weakness (generalized)  Stiffness of right shoulder, not elsewhere classified     Problem List Patient Active Problem List   Diagnosis Date Noted  . Headache 04/27/2015  . Anemia, iron deficiency 02/28/2015  . Arthritis 02/21/2015  . Orthostatic hypotension 02/21/2015  . Constipation 07/04/2014  . Preventative health care 01/07/2014  . Postoperative wound infection 06/16/2013  . Family history of prostate cancer 02/09/2013  . Fatigue 02/09/2013  . Low back pain 06/23/2012   . BENIGN POSITIONAL VERTIGO, HX OF 05/16/2009  . HYPERTENSION, MILD 07/26/2008  . EIC (epidermal inclusion cyst) 12/22/2007  . RESTLESS LEG SYNDROME, MILD 09/29/2007  . Sleep apnea 03/17/2007  . GLUCOSE INTOLERANCE 10/09/2006  . OBESITY, NOS 10/09/2006  . Depression 10/09/2006  . Allergic rhinitis 10/09/2006  . ASTHMA, INTERMITTENT 10/09/2006  . GASTROESOPHAGEAL REFLUX, NO ESOPHAGITIS 10/09/2006  . IRRITABLE BOWEL SYNDROME 10/09/2006    Carney Living  PT DPT   01/11/2016, 4:16 PM  [facility] [address] [place], [place], [address] Phone: [phone]   Fax:  [phone]  Name: MATAYO UPADHYAY MRN: [id number] Date of Birth: 1965-11-29

## 2016-01-15 ENCOUNTER — Encounter: Payer: 59 | Admitting: Physical Therapy

## 2016-01-17 ENCOUNTER — Ambulatory Visit: Payer: 59 | Admitting: Physical Therapy

## 2016-01-17 DIAGNOSIS — M6281 Muscle weakness (generalized): Secondary | ICD-10-CM

## 2016-01-17 DIAGNOSIS — M25611 Stiffness of right shoulder, not elsewhere classified: Secondary | ICD-10-CM | POA: Diagnosis not present

## 2016-01-17 DIAGNOSIS — M25511 Pain in right shoulder: Secondary | ICD-10-CM | POA: Diagnosis not present

## 2016-01-17 NOTE — Therapy (Signed)
[place], [place], [address] Phone: [phone]   Fax:  [phone]  Physical Therapy Treatment  Patient Details  Name: Patrick Oconnor MRN: [id number] Date of Birth: June 15, 1966 Referring Provider: Dr Justice Britain   Encounter Date: 01/17/2016      PT End of Session - 01/17/16 1337    Visit Number 8   Number of Visits 24   Date for PT Re-Evaluation 03/13/16   Authorization Type [employer] employee    PT Start Time 0126   PT Stop Time 0210   PT Time Calculation (min) 44 min      Past Medical History  Diagnosis Date  . GERD (gastroesophageal reflux disease)   . Sleep apnea 9/08    Started CPAP; does not wear CPAP nightly  . PONV (postoperative nausea and vomiting)   . Hypertension   . Pneumonia     hx of  . Asthma, exercise induced   . IBS (irritable bowel syndrome)   . Environmental allergies   . Depression   . History of bronchitis   . Anemia   . Degenerative disc disease, lumbar     Past Surgical History  Procedure Laterality Date  . Pyloroplasty  infant    pyloric stenosis  . Tonsillectomy and adenoidectomy    . Pilonidal cystectomy  42003    x 2  . Thrombosed external hemorrhoid  9/08    incised  . Esophagogastroduodenoscopy  3/09    normal; multiple  . Colonoscopy  08/12/2005    "multiple"  . Acromio-clavicular joint repair Right 04/15/2013    Procedure: RIGHT SHOULDER ACROMIO-CLAVICULAR RECONSTRUCTION WITH ALLOGRAFT   ;  Surgeon: Marin Shutter, MD;  Location: [place];  Service: Orthopedics;  Laterality: Right;  . Septoplasty    . Incision and drainage Right 06/03/2013    Procedure: INCISION AND DRAINAGE RIGHT SHOULDER;  Surgeon: Marin Shutter, MD;  Location: [place];  Service: Orthopedics;  Laterality: Right;  . Acromio-clavicular joint repair Right 06/03/2013    Procedure: ACROMIO-CLAVICULAR JOINT REPAIR;  Surgeon: Marin Shutter, MD;  Location: [place];  Service: Orthopedics;  Laterality:  Right;  . Shoulder open rotator cuff repair Right 10/19/2015    Procedure: OPEN RIGHT SHOULDER ROTATOR CUFF REPAIR ;  Surgeon: Justice Britain, MD;  Location: [place];  Service: Orthopedics;  Laterality: Right;  . Acromio-clavicular joint repair Right 10/19/2015    Procedure: OPEN RIGHT SHOULDER ACROMIO-CLAVICULAR JOINT RECONSTRUCTION ;  Surgeon: Justice Britain, MD;  Location: [place];  Service: Orthopedics;  Laterality: Right;    There were no vitals filed for this visit.      Subjective Assessment - 01/17/16 1337    Subjective 4-5/10 with overhead stuff   Currently in Pain? Yes   Pain Score 1    Pain Location Shoulder   Aggravating Factors  reaching over head with weight   Pain Relieving Factors rest            [facility] PT Assessment - 01/17/16 0001    ROM / Strength   AROM / PROM / Strength Strength   AROM   Right/Left Shoulder Right;Left   PROM   Right Shoulder Flexion 136 Degrees   Right Shoulder ABduction 125 Degrees   Right Shoulder Internal Rotation --  reach to T-10   Right Shoulder External Rotation --  reach to T-2   Strength   Right/Left Shoulder Right;Left   Right Shoulder Flexion 4-/5  pain   Right Shoulder ABduction  3+/5  pain   Right Shoulder Internal Rotation 4/5   Right Shoulder External Rotation 4/5                     [facility] Adult PT Treatment/Exercise - 01/17/16 0001    Shoulder Exercises: Sidelying   ABduction 10 reps   Shoulder Exercises: Standing   Flexion 10 reps;Strengthening;Right   Theraband Level (Shoulder Flexion) Level 1 (Yellow)   ABduction 10 reps   Theraband Level (Shoulder ABduction) Level 1 (Yellow)   Other Standing Exercises wall wash with pillow case x 1  minute overhead flexion, abduction at 90 degrees.    Shoulder Exercises: ROM/Strengthening   UBE (Upper Arm Bike) L 2 x 6 min                  PT Short Term Goals - 01/11/16 1609    PT SHORT TERM GOAL #1   Title Pt's shoulder strength will be measured at 4/5     Baseline 4/5 in flexion    Time 6   Period Weeks   Status Achieved   PT SHORT TERM GOAL #2   Title Pt will be I w/ HEP    Time 6   Period Weeks   Status Achieved   PT SHORT TERM GOAL #3   Title Pt will increase passive right shoulder flexion range of motion to 70 degrees   Baseline 165 degrees    Time 6   Period Weeks   Status Achieved   PT SHORT TERM GOAL #4   Title Pt will increase passive right shoulder flexion to 160 degrees    Baseline 165 degrees    Time 6   Period Weeks   Status Achieved           PT Long Term Goals - 01/17/16 1341    PT LONG TERM GOAL #1   Title Patient will reach overhead to a cabinet without pain in order to perfrom IADL's    Baseline started active flexion today    Status Achieved   PT LONG TERM GOAL #2   Title Patient will reach behind his back to t-8 in order to perfrom ADL's    Baseline t-10 today    Period Weeks   Status On-going   PT LONG TERM GOAL #3   Title Patient will transfer patient with gait belt without increased pain in order to return to work.   Baseline 1/10   Time 12   Period Weeks   Status Partially Met   PT LONG TERM GOAL #4   Title Pt will demosntrate 5/5 right shoulder strength with good endurance in order to return to work.    Time 12   Period Weeks   Status On-going               Plan - 01/17/16 1411    Clinical Impression Statement Pt returns to work next week. Weakness in right shoulder verses left. Instructed pt in progressive PREs for flexion and abduction using yellow theraband. Pt to attend 1 x per week as he transitions back to  work. Will assess strength progress each week and progress as able.    PT Next Visit Plan UE ranger for IR, strength       Patient will benefit from skilled therapeutic intervention in order to improve the following deficits and impairments:  Decreased activity tolerance, Decreased mobility, Decreased strength, Pain, Impaired tone  Visit Diagnosis: Pain in right  shoulder  Muscle  weakness (generalized)  Stiffness of right shoulder, not elsewhere classified     Problem List Patient Active Problem List   Diagnosis Date Noted  . Headache 04/27/2015  . Anemia, iron deficiency 02/28/2015  . Arthritis 02/21/2015  . Orthostatic hypotension 02/21/2015  . Constipation 07/04/2014  . Preventative health care 01/07/2014  . Postoperative wound infection 06/16/2013  . Family history of prostate cancer 02/09/2013  . Fatigue 02/09/2013  . Low back pain 06/23/2012  . BENIGN POSITIONAL VERTIGO, HX OF 05/16/2009  . HYPERTENSION, MILD 07/26/2008  . EIC (epidermal inclusion cyst) 12/22/2007  . RESTLESS LEG SYNDROME, MILD 09/29/2007  . Sleep apnea 03/17/2007  . GLUCOSE INTOLERANCE 10/09/2006  . OBESITY, NOS 10/09/2006  . Depression 10/09/2006  . Allergic rhinitis 10/09/2006  . ASTHMA, INTERMITTENT 10/09/2006  . GASTROESOPHAGEAL REFLUX, NO ESOPHAGITIS 10/09/2006  . IRRITABLE BOWEL SYNDROME 10/09/2006    Dorene Ar, PTA 01/17/2016, 2:22 PM  [facility] [address] [place], [place], [address] Phone: [phone]   Fax:  [phone]  Name: Patrick Oconnor MRN: [id number] Date of Birth: 1965/11/24

## 2016-01-19 ENCOUNTER — Other Ambulatory Visit: Payer: Self-pay | Admitting: Gastroenterology

## 2016-01-22 ENCOUNTER — Ambulatory Visit (HOSPITAL_COMMUNITY): Payer: 59 | Admitting: Certified Registered Nurse Anesthetist

## 2016-01-22 ENCOUNTER — Ambulatory Visit (HOSPITAL_COMMUNITY)
Admission: RE | Admit: 2016-01-22 | Discharge: 2016-01-22 | Disposition: A | Discharge status: Home / Self Care | Payer: 59 | Source: Ambulatory Visit | Attending: Gastroenterology | Admitting: Gastroenterology

## 2016-01-22 ENCOUNTER — Encounter (HOSPITAL_COMMUNITY)
Admission: RE | Disposition: A | Discharge status: Home / Self Care | Payer: Self-pay | Source: Ambulatory Visit | Attending: Gastroenterology

## 2016-01-22 ENCOUNTER — Encounter (HOSPITAL_COMMUNITY): Payer: Self-pay | Admitting: Certified Registered Nurse Anesthetist

## 2016-01-22 ENCOUNTER — Encounter: Payer: Self-pay | Admitting: Family Medicine

## 2016-01-22 DIAGNOSIS — D509 Iron deficiency anemia, unspecified: Secondary | ICD-10-CM | POA: Diagnosis not present

## 2016-01-22 DIAGNOSIS — K2950 Unspecified chronic gastritis without bleeding: Secondary | ICD-10-CM | POA: Diagnosis not present

## 2016-01-22 DIAGNOSIS — K2900 Acute gastritis without bleeding: Secondary | ICD-10-CM | POA: Diagnosis not present

## 2016-01-22 DIAGNOSIS — I10 Essential (primary) hypertension: Secondary | ICD-10-CM | POA: Insufficient documentation

## 2016-01-22 DIAGNOSIS — K219 Gastro-esophageal reflux disease without esophagitis: Secondary | ICD-10-CM | POA: Diagnosis not present

## 2016-01-22 DIAGNOSIS — G4730 Sleep apnea, unspecified: Secondary | ICD-10-CM | POA: Insufficient documentation

## 2016-01-22 DIAGNOSIS — Z7982 Long term (current) use of aspirin: Secondary | ICD-10-CM | POA: Diagnosis not present

## 2016-01-22 DIAGNOSIS — Z791 Long term (current) use of non-steroidal anti-inflammatories (NSAID): Secondary | ICD-10-CM | POA: Insufficient documentation

## 2016-01-22 DIAGNOSIS — Z803 Family history of malignant neoplasm of breast: Secondary | ICD-10-CM | POA: Insufficient documentation

## 2016-01-22 DIAGNOSIS — Z800 Family history of malignant neoplasm of digestive organs: Secondary | ICD-10-CM | POA: Diagnosis not present

## 2016-01-22 DIAGNOSIS — Z79899 Other long term (current) drug therapy: Secondary | ICD-10-CM | POA: Insufficient documentation

## 2016-01-22 DIAGNOSIS — M1990 Unspecified osteoarthritis, unspecified site: Secondary | ICD-10-CM | POA: Insufficient documentation

## 2016-01-22 DIAGNOSIS — F329 Major depressive disorder, single episode, unspecified: Secondary | ICD-10-CM | POA: Insufficient documentation

## 2016-01-22 DIAGNOSIS — J4520 Mild intermittent asthma, uncomplicated: Secondary | ICD-10-CM | POA: Diagnosis not present

## 2016-01-22 DIAGNOSIS — Z8042 Family history of malignant neoplasm of prostate: Secondary | ICD-10-CM | POA: Diagnosis not present

## 2016-01-22 HISTORY — PX: ESOPHAGOGASTRODUODENOSCOPY (EGD) WITH PROPOFOL: SHX5813

## 2016-01-22 SURGERY — EGD (ESOPHAGOGASTRODUODENOSCOPY)
Anesthesia: Monitor Anesthesia Care

## 2016-01-22 SURGERY — ESOPHAGOGASTRODUODENOSCOPY (EGD) WITH PROPOFOL
Anesthesia: Monitor Anesthesia Care

## 2016-01-22 MED ORDER — ONDANSETRON HCL 4 MG/2ML IJ SOLN
INTRAMUSCULAR | Status: AC
  Filled 2016-01-22: qty 2

## 2016-01-22 MED ORDER — LIDOCAINE HCL (CARDIAC) 20 MG/ML IV SOLN
INTRAVENOUS | Status: AC
  Filled 2016-01-22: qty 5

## 2016-01-22 MED ORDER — PROPOFOL 10 MG/ML IV BOLUS
INTRAVENOUS | Status: AC
  Filled 2016-01-22: qty 40

## 2016-01-22 MED ORDER — LACTATED RINGERS IV SOLN
INTRAVENOUS | Status: DC
  Administered 2016-01-22: 10:00:00 via INTRAVENOUS

## 2016-01-22 MED ORDER — ONDANSETRON HCL 4 MG/2ML IJ SOLN
INTRAMUSCULAR | Status: DC | PRN
  Administered 2016-01-22: 4 mg via INTRAVENOUS

## 2016-01-22 MED ORDER — LIDOCAINE HCL (CARDIAC) 20 MG/ML IV SOLN
INTRAVENOUS | Status: DC | PRN
  Administered 2016-01-22: 100 mg via INTRATRACHEAL

## 2016-01-22 MED ORDER — SODIUM CHLORIDE 0.9 % IV SOLN: INTRAVENOUS | Status: DC

## 2016-01-22 MED ORDER — FENTANYL CITRATE (PF) 100 MCG/2ML IJ SOLN: 25.0000 ug | INTRAMUSCULAR | Status: DC | PRN

## 2016-01-22 MED ORDER — PROPOFOL 500 MG/50ML IV EMUL
INTRAVENOUS | Status: DC | PRN
  Administered 2016-01-22: 250 ug/kg/min via INTRAVENOUS

## 2016-01-22 SURGICAL SUPPLY — 14 items

## 2016-01-22 NOTE — Transfer of Care (Signed)
Immediate Anesthesia Transfer of Care Note  Patient: Patrick Oconnor  Procedure(s) Performed: Procedure(s): ESOPHAGOGASTRODUODENOSCOPY (EGD) WITH PROPOFOL (N/A)  Patient Location: PACU  Anesthesia Type:MAC  Level of Consciousness:  sedated, patient cooperative and responds to stimulation  Airway & Oxygen Therapy:Patient Spontanous Breathing and Patient connected to face mask oxgen  Post-op Assessment:  Report given to PACU RN and Post -op Vital signs reviewed and stable  Post vital signs:  Reviewed and stable  Last Vitals:  Filed Vitals:   01/22/16 0929  BP: 145/101  Pulse: 75  Temp: 36.7 C  Resp: 13    Complications: No apparent anesthesia complications

## 2016-01-22 NOTE — Discharge Instructions (Signed)
Esophagogastroduodenoscopy, Care After Refer to this sheet in the next few weeks. These instructions provide you with information about caring for yourself after your procedure. Your health care provider may also give you more specific instructions. Your treatment has been planned according to current medical practices, but problems sometimes occur. Call your health care provider if you have any problems or questions after your procedure. WHAT TO EXPECT AFTER THE PROCEDURE After your procedure, it is typical to feel:  Soreness in your throat.  Pain with swallowing.  Sick to your stomach (nauseous).  Bloated.  Dizzy.  Fatigued. HOME CARE INSTRUCTIONS  Do not eat or drink anything until the numbing medicine (local anesthetic) has worn off and your gag reflex has returned. You will know that the local anesthetic has worn off when you can swallow comfortably.  Do not drive or operate machinery until directed by your health care provider.  Take medicines only as directed by your health care provider. SEEK MEDICAL CARE IF:   You cannot stop coughing.  You are not urinating at all or less than usual. SEEK IMMEDIATE MEDICAL CARE IF:  You have difficulty swallowing.  You cannot eat or drink.  You have worsening throat or chest pain.  You have dizziness or lightheadedness or you faint.  You have nausea or vomiting.  You have chills.  You have a fever.  You have severe abdominal pain.  You have black, tarry, or bloody stools.   This information is not intended to replace advice given to you by your health care provider. Make sure you discuss any questions you have with your health care provider.   Document Released: 07/15/2012 Document Revised: 08/19/2014 Document Reviewed: 07/15/2012 Elsevier Interactive Patient Education 2016 [place] NO NSAIDS Continue the omeprazole.

## 2016-01-22 NOTE — H&P (Signed)
Subjective:   Patient is a 50 y.o. male presents with Iron deficiency anemia. Stools have been hemoccult negative colonoscopies are up-to-date iron was slow it 12% saturation. The does have a low ferritin. He has been intermittently taking some NSAIDs but has been on omeprazole. He also is a regular blood donor. This procedure is performed to make certain that there is not an upper G.I. bleeding source. Procedure including risks and benefits discussed in office.  Patient Active Problem List   Diagnosis Date Noted  . Headache 04/27/2015  . Anemia, iron deficiency 02/28/2015  . Arthritis 02/21/2015  . Orthostatic hypotension 02/21/2015  . Constipation 07/04/2014  . Preventative health care 01/07/2014  . Postoperative wound infection 06/16/2013  . Family history of prostate cancer 02/09/2013  . Fatigue 02/09/2013  . Low back pain 06/23/2012  . BENIGN POSITIONAL VERTIGO, HX OF 05/16/2009  . HYPERTENSION, MILD 07/26/2008  . EIC (epidermal inclusion cyst) 12/22/2007  . RESTLESS LEG SYNDROME, MILD 09/29/2007  . Sleep apnea 03/17/2007  . GLUCOSE INTOLERANCE 10/09/2006  . OBESITY, NOS 10/09/2006  . Depression 10/09/2006  . Allergic rhinitis 10/09/2006  . ASTHMA, INTERMITTENT 10/09/2006  . GASTROESOPHAGEAL REFLUX, NO ESOPHAGITIS 10/09/2006  . IRRITABLE BOWEL SYNDROME 10/09/2006   Past Medical History  Diagnosis Date  . GERD (gastroesophageal reflux disease)   . Sleep apnea 9/08    Started CPAP; does not wear CPAP nightly  . PONV (postoperative nausea and vomiting)   . Hypertension   . Pneumonia     hx of  . Asthma, exercise induced   . IBS (irritable bowel syndrome)   . Environmental allergies   . Depression   . History of bronchitis   . Anemia   . Degenerative disc disease, lumbar     Past Surgical History  Procedure Laterality Date  . Pyloroplasty  infant    pyloric stenosis  . Tonsillectomy and adenoidectomy    . Pilonidal cystectomy  42003    x 2  . Thrombosed  external hemorrhoid  9/08    incised  . Esophagogastroduodenoscopy  3/09    normal; multiple  . Colonoscopy  08/12/2005    "multiple"  . Acromio-clavicular joint repair Right 04/15/2013    Procedure: RIGHT SHOULDER ACROMIO-CLAVICULAR RECONSTRUCTION WITH ALLOGRAFT   ;  Surgeon: Marin Shutter, MD;  Location: [place];  Service: Orthopedics;  Laterality: Right;  . Septoplasty    . Incision and drainage Right 06/03/2013    Procedure: INCISION AND DRAINAGE RIGHT SHOULDER;  Surgeon: Marin Shutter, MD;  Location: [place];  Service: Orthopedics;  Laterality: Right;  . Acromio-clavicular joint repair Right 06/03/2013    Procedure: ACROMIO-CLAVICULAR JOINT REPAIR;  Surgeon: Marin Shutter, MD;  Location: [place];  Service: Orthopedics;  Laterality: Right;  . Shoulder open rotator cuff repair Right 10/19/2015    Procedure: OPEN RIGHT SHOULDER ROTATOR CUFF REPAIR ;  Surgeon: Justice Britain, MD;  Location: [place];  Service: Orthopedics;  Laterality: Right;  . Acromio-clavicular joint repair Right 10/19/2015    Procedure: OPEN RIGHT SHOULDER ACROMIO-CLAVICULAR JOINT RECONSTRUCTION ;  Surgeon: Justice Britain, MD;  Location: [place];  Service: Orthopedics;  Laterality: Right;    Prescriptions prior to admission  Medication Sig Dispense Refill Last Dose  . albuterol (PROVENTIL,VENTOLIN) 90 MCG/ACT inhaler Inhale 2 puffs into the lungs every 6 (six) hours as needed for shortness of breath.    5 wks ago  . aspirin EC 81 MG tablet Take 81 mg by mouth daily.     Marland Kitchen  citalopram (CELEXA) 20 MG tablet Take 1 tablet (20 mg total) by mouth daily. 90 tablet 1   . diazepam (VALIUM) 5 MG tablet Take 0.5-1 tablets (2.5-5 mg total) by mouth every 6 (six) hours as needed for muscle spasms or sedation. 40 tablet 1   . ferrous sulfate 325 (65 FE) MG tablet Take 1 tablet (325 mg total) by mouth daily with breakfast. (Patient taking differently: Take 325 mg by mouth every other day. ) 60 tablet 3 Past Week at Unknown time  . fluticasone (FLONASE)  50 MCG/ACT nasal spray Place 2 sprays into both nostrils daily. (Patient taking differently: Place 2 sprays into both nostrils daily as needed for allergies. ) 16 g 2 6 wks ago  . hydrochlorothiazide (HYDRODIURIL) 25 MG tablet TAKE 1 TABLET BY MOUTH DAILY. 90 tablet 1 Past Week at Unknown time  . ibuprofen (ADVIL,MOTRIN) 200 MG tablet Take 600 mg by mouth every 6 (six) hours as needed (For pain.).      Marland Kitchen loratadine (CLARITIN) 10 MG tablet Take 10 mg by mouth daily.    10/19/2015 at 1000  . meloxicam (MOBIC) 15 MG tablet Take 1 tablet (15 mg total) by mouth daily. (Patient taking differently: Take 15 mg by mouth daily as needed for pain. ) 90 tablet 1 Past Week at Unknown time  . naproxen sodium (ALEVE) 220 MG tablet Take 220-440 mg by mouth every 8 (eight) hours as needed (For pain.).      Marland Kitchen omeprazole (PRILOSEC) 20 MG capsule TAKE 1 CAPSULE BY MOUTH ONCE DAILY 90 capsule 3 10/19/2015 at 1000  . ondansetron (ZOFRAN) 4 MG tablet Take 1 tablet (4 mg total) by mouth every 8 (eight) hours as needed for nausea or vomiting. 20 tablet 0   . polyethylene glycol powder (GLYCOLAX/MIRALAX) powder Take 17 g by mouth 2 (two) times daily as needed. (Patient taking differently: Take 17 g by mouth 2 (two) times daily as needed (For constipation.). ) 3350 g 1 months ago  . Probiotic Product (PROBIOTIC DAILY PO) Take 1 capsule by mouth daily as needed (For digestive health.).    months ago  . rOPINIRole (REQUIP) 0.25 MG tablet Take 1 tablet (0.25 mg total) by mouth at bedtime. 30 tablet 2   . sennosides-docusate sodium (SENOKOT-S) 8.6-50 MG tablet Take 2 tablets by mouth daily. (Patient taking differently: Take 1-2 tablets by mouth daily as needed for constipation. ) 20 tablet 0 6 wks ago  . Tetrahydroz-Dextran-PEG-Povid (VISINE ADVANCED RELIEF OP) Apply 1-2 drops to eye as needed (For eye irritation.).    Past Week at Unknown time   No Known Allergies  Social History  Substance Use Topics  . Smoking status: Never Smoker    . Smokeless tobacco: Never Used  . Alcohol Use: 0.0 oz/week    .25 Standard drinks or equivalent per week     Comment: rarely    Family History  Problem Relation Age of Onset  . Cancer Paternal Grandmother     Colon  . Cancer Maternal Grandmother     breast  . Cancer Father 23    prostate Stage 4     Objective:   No data found.        See MD Preop evaluation      Assessment:   1. Significant iron deficiency anemia  Plan:   Will proceed with EGD to rule out a call upper G.I. bleeding source and likely obtain small bowel biopsies for celiac disease.

## 2016-01-22 NOTE — Anesthesia Postprocedure Evaluation (Signed)
Anesthesia Post Note  Patient: Patrick Oconnor  Procedure(s) Performed: Procedure(s) (LRB): ESOPHAGOGASTRODUODENOSCOPY (EGD) WITH PROPOFOL (N/A)  Patient location during evaluation: PACU Anesthesia Type: MAC Level of consciousness: awake and alert Pain management: pain level controlled Vital Signs Assessment: post-procedure vital signs reviewed and stable Respiratory status: spontaneous breathing, nonlabored ventilation and respiratory function stable Cardiovascular status: stable and blood pressure returned to baseline Anesthetic complications: no    Last Vitals:  Filed Vitals:   01/22/16 1030 01/22/16 1040  BP: 117/71 107/63  Pulse: 63 65  Temp:    Resp: 12 14    Last Pain: There were no vitals filed for this visit.               Griff Badley,W. EDMOND

## 2016-01-22 NOTE — Anesthesia Preprocedure Evaluation (Addendum)
Anesthesia Evaluation  Patient identified by MRN, date of birth, ID band Patient awake    Reviewed: Allergy & Precautions, H&P , NPO status , Patient's Chart, lab work & pertinent test results  History of Anesthesia Complications (+) PONV  Airway Mallampati: III  TM Distance: >3 FB Neck ROM: Full    Dental no notable dental hx. (+) Teeth Intact, Dental Advisory Given   Pulmonary asthma , sleep apnea ,    Pulmonary exam normal breath sounds clear to auscultation       Cardiovascular hypertension, Pt. on medications  Rhythm:Regular Rate:Normal     Neuro/Psych  Headaches, Depression    GI/Hepatic Neg liver ROS, GERD  Controlled,  Endo/Other  negative endocrine ROS  Renal/GU negative Renal ROS  negative genitourinary   Musculoskeletal  (+) Arthritis , Osteoarthritis,    Abdominal   Peds  Hematology negative hematology ROS (+)   Anesthesia Other Findings   Reproductive/Obstetrics negative OB ROS                            Anesthesia Physical Anesthesia Plan  ASA: III  Anesthesia Plan: MAC   Post-op Pain Management:    Induction: Intravenous  Airway Management Planned: Nasal Cannula  Additional Equipment:   Intra-op Plan:   Post-operative Plan:   Informed Consent: I have reviewed the patients History and Physical, chart, labs and discussed the procedure including the risks, benefits and alternatives for the proposed anesthesia with the patient or authorized representative who has indicated his/her understanding and acceptance.   Dental advisory given  Plan Discussed with: CRNA  Anesthesia Plan Comments:         Anesthesia Quick Evaluation

## 2016-01-22 NOTE — Op Note (Signed)
[facility] Patient Name: Patrick Oconnor Procedure Date: 01/22/2016 MRN: [id number] Attending MD: Nancy Fetter Dr., MD Date of Birth: [birth year] CSN: [id number] Age: 50 Admit Type: Outpatient Procedure:                Upper GI endoscopy with biopsy Indications:              Iron deficiency anemia Providers:                Jeneen Rinks L. Oaklyn Mans Dr., MD, Cleda Daub, RN,                            Zenon Mayo, RN, Cherylynn Ridges, Technician, Virgia Land, CRNA Referring MD:              Medicines:                Propofol total dose 123456 mg IV Complications:            No immediate complications. Estimated Blood Loss:     Estimated blood loss was minimal. Procedure:                Pre-Anesthesia Assessment:                           - Prior to the procedure, a History and Physical                            was performed, and patient medications and                            allergies were reviewed. The patient's tolerance of                            previous anesthesia was also reviewed. The risks                            and benefits of the procedure and the sedation                            options and risks were discussed with the patient.                            All questions were answered, and informed consent                            was obtained. Prior Anticoagulants: The patient has                            taken aspirin, last dose was 1 day prior to                            procedure. ASA Grade Assessment: III - A patient  with severe systemic disease. After reviewing the                            risks and benefits, the patient was deemed in                            satisfactory condition to undergo the procedure.                           After obtaining informed consent, the endoscope was                            passed under direct vision. Throughout the   procedure, the patient's blood pressure, pulse, and                            oxygen saturations were monitored continuously. The                            [id number]) scope was introduced through the                            mouth, and advanced to the second part of duodenum.                            The upper GI endoscopy was accomplished without                            difficulty. The patient tolerated the procedure                            well. Scope In: Scope Out: Findings:      There is no endoscopic evidence of Barrett's esophagus, inflammation,       stricture or varices in the entire esophagus.      Localized moderate inflammation characterized by congestion (edema),       erosions, friability and aphthous ulcerations was found in the gastric       antrum. Biopsies were taken with a cold forceps for histology.      The examined duodenum was normal. Impression:               - Acute gastritis. Biopsied.                           - Normal examined duodenum. Moderate Sedation:      MAC by anesthesia Recommendation:           - Patient has a contact number available for                            emergencies. The signs and symptoms of potential                            delayed complications were discussed with the  patient. Return to normal activities tomorrow.                            Written discharge instructions were provided to the                            patient.                           - Resume previous diet.                           - Continue present medications.                           - No aspirin, ibuprofen, naproxen, or other                            non-steroidal anti-inflammatory drugs.                           - Return to endoscopist in 1 month. Procedure Code(s):        --- Professional ---                           [phone], Esophagogastroduodenoscopy, flexible,                            transoral; with  biopsy, single or multiple Diagnosis Code(s):        --- Professional ---                           K29.00, Acute gastritis without bleeding                           D50.9, Iron deficiency anemia, unspecified CPT copyright 2016 American Medical Association. All rights reserved. The codes documented in this report are preliminary and upon coder review may  be revised to meet current compliance requirements. Nancy Fetter Dr., MD 01/22/2016 10:21:44 AM This report has been signed electronically. Number of Addenda: 0

## 2016-01-23 ENCOUNTER — Encounter: Payer: 59 | Admitting: Physical Therapy

## 2016-01-23 ENCOUNTER — Encounter (HOSPITAL_COMMUNITY): Payer: Self-pay | Admitting: Gastroenterology

## 2016-01-25 ENCOUNTER — Encounter: Payer: 59 | Admitting: Physical Therapy

## 2016-02-01 ENCOUNTER — Ambulatory Visit: Payer: 59 | Admitting: Physical Therapy

## 2016-02-01 DIAGNOSIS — M25511 Pain in right shoulder: Secondary | ICD-10-CM

## 2016-02-01 DIAGNOSIS — M25611 Stiffness of right shoulder, not elsewhere classified: Secondary | ICD-10-CM

## 2016-02-01 DIAGNOSIS — M6281 Muscle weakness (generalized): Secondary | ICD-10-CM | POA: Diagnosis not present

## 2016-02-01 NOTE — Therapy (Signed)
[place], [place], [address] Phone: [phone]   Fax:  [phone]  Physical Therapy Treatment  Patient Details  Name: Patrick Oconnor MRN: [id number] Date of Birth: 1965-10-03 Referring Provider: Dr Justice Britain   Encounter Date: 02/01/2016      PT End of Session - 02/01/16 1431    Visit Number 9   Number of Visits 24   Date for PT Re-Evaluation 03/13/16   Authorization Type [employer] employee    PT Start Time 1330   PT Stop Time 1427   PT Time Calculation (min) 57 min   Activity Tolerance Patient tolerated treatment well   Behavior During Therapy [facility] for tasks assessed/performed      Past Medical History  Diagnosis Date  . GERD (gastroesophageal reflux disease)   . Sleep apnea 9/08    Started CPAP; does not wear CPAP nightly  . PONV (postoperative nausea and vomiting)   . Hypertension   . Pneumonia     hx of  . Asthma, exercise induced   . IBS (irritable bowel syndrome)   . Environmental allergies   . Depression   . History of bronchitis   . Anemia   . Degenerative disc disease, lumbar     Past Surgical History  Procedure Laterality Date  . Pyloroplasty  infant    pyloric stenosis  . Tonsillectomy and adenoidectomy    . Pilonidal cystectomy  42003    x 2  . Thrombosed external hemorrhoid  9/08    incised  . Esophagogastroduodenoscopy  3/09    normal; multiple  . Colonoscopy  08/12/2005    "multiple"  . Acromio-clavicular joint repair Right 04/15/2013    Procedure: RIGHT SHOULDER ACROMIO-CLAVICULAR RECONSTRUCTION WITH ALLOGRAFT   ;  Surgeon: Marin Shutter, MD;  Location: [place];  Service: Orthopedics;  Laterality: Right;  . Septoplasty    . Incision and drainage Right 06/03/2013    Procedure: INCISION AND DRAINAGE RIGHT SHOULDER;  Surgeon: Marin Shutter, MD;  Location: [place];  Service: Orthopedics;  Laterality: Right;  . Acromio-clavicular joint repair Right 06/03/2013    Procedure:  ACROMIO-CLAVICULAR JOINT REPAIR;  Surgeon: Marin Shutter, MD;  Location: [place];  Service: Orthopedics;  Laterality: Right;  . Shoulder open rotator cuff repair Right 10/19/2015    Procedure: OPEN RIGHT SHOULDER ROTATOR CUFF REPAIR ;  Surgeon: Justice Britain, MD;  Location: [place];  Service: Orthopedics;  Laterality: Right;  . Acromio-clavicular joint repair Right 10/19/2015    Procedure: OPEN RIGHT SHOULDER ACROMIO-CLAVICULAR JOINT RECONSTRUCTION ;  Surgeon: Justice Britain, MD;  Location: [place];  Service: Orthopedics;  Laterality: Right;  . Esophagogastroduodenoscopy (egd) with propofol N/A 01/22/2016    Procedure: ESOPHAGOGASTRODUODENOSCOPY (EGD) WITH PROPOFOL;  Surgeon: Laurence Spates, MD;  Location: [facility];  Service: Endoscopy;  Laterality: N/A;    There were no vitals filed for this visit.      Subjective Assessment - 02/01/16 1339    Subjective Patient can no longer take nsaids so he has had more pain. He continues to have about a 3-4/10 pain. He feels his shoulder is tight, He painted crown molding yesterday which got it sore.    Pertinent History Pt has a history of AC joint reconstrutions. He has fallen skiing on 2 other occasions and suffered seprations. He also has a RTC repair on 10/19/15/ Since that point he is out of his sling. He has been doing pendulums at home. He is having difficultys performing  ADL's with his non-dominant hand .    Limitations Lifting;Other (comment)   How long can you sit comfortably? N/A   How long can you stand comfortably? N/A    How long can you walk comfortably? N/A    Diagnostic tests nothing post op    Patient Stated Goals Get back to work    Currently in Pain? Yes   Pain Score 3    Pain Location Shoulder   Pain Orientation Right   Pain Descriptors / Indicators Sore   Pain Onset More than a month ago   Pain Frequency Intermittent   Aggravating Factors  reaching overhead with weight    Pain Relieving Factors rest   Effect of Pain on Daily Activities  difficulty using arm                                  PT Education - 02/01/16 1427    Education provided Yes   Education Details updated HEP    Person(s) Educated Patient   Methods Explanation   Comprehension Verbalized understanding;Returned demonstration          PT Short Term Goals - 01/11/16 1609    PT SHORT TERM GOAL #1   Title Pt's shoulder strength will be measured at 4/5    Baseline 4/5 in flexion    Time 6   Period Weeks   Status Achieved   PT SHORT TERM GOAL #2   Title Pt will be I w/ HEP    Time 6   Period Weeks   Status Achieved   PT SHORT TERM GOAL #3   Title Pt will increase passive right shoulder flexion range of motion to 70 degrees   Baseline 165 degrees    Time 6   Period Weeks   Status Achieved   PT SHORT TERM GOAL #4   Title Pt will increase passive right shoulder flexion to 160 degrees    Baseline 165 degrees    Time 6   Period Weeks   Status Achieved           PT Long Term Goals - 02/01/16 1453    PT LONG TERM GOAL #1   Title Patient will reach overhead to a cabinet without pain in order to perfrom IADL's    Baseline perfroming active flexion but still weak    Time 12   Period Weeks   Status Achieved   PT LONG TERM GOAL #2   Title Patient will reach behind his back to t-8 in order to perfrom ADL's    Baseline t-10 today    Time 12   Period Weeks   Status On-going   PT LONG TERM GOAL #3   Title Patient will transfer patient with gait belt without increased pain in order to return to work.   Baseline 1/10   Period Weeks   Status Partially Met   PT LONG TERM GOAL #4   Title Pt will demosntrate 5/5 right shoulder strength with good endurance in order to return to work.    Time 12   Period Weeks   Status On-going               Plan - 02/01/16 1432    Clinical Impression Statement Patient has returned to work. He had some pain after the first day. He still feels as though he is weak but he is  improving. He tolerated treatment well. He  is still having some end range tightness with flexion at end range. He was given standing d2 flexion today.    PT Frequency 2x / week   PT Duration 8 weeks   PT Treatment/Interventions ADLs/Self Care Home Management;Moist Heat;Electrical Stimulation;Ultrasound;Functional mobility training;Therapeutic exercise;Manual techniques;Patient/family education;Therapeutic activities   PT Next Visit Plan UE ranger for IR, strength    PT Home Exercise Plan Supine wand flexion, and ER, Standing wand IR with wide grip; scap retraction to neutral  w/ yellow t-band; Shoulder extension to neutral with yellow band. ER/IR yellow band, serratus punch, bicpe tricep green band, prone extension, side lying ER, Prone retraction ; yelllow band wall walk, Yellow band wall clock ball v wall  D2 standiung flexion.    Consulted and Agree with Plan of Care Patient      Patient will benefit from skilled therapeutic intervention in order to improve the following deficits and impairments:  Decreased activity tolerance, Decreased mobility, Decreased strength, Pain, Impaired tone  Visit Diagnosis: Pain in right shoulder  Muscle weakness (generalized)  Stiffness of right shoulder, not elsewhere classified     Problem List Patient Active Problem List   Diagnosis Date Noted  . Headache 04/27/2015  . Anemia, iron deficiency 02/28/2015  . Arthritis 02/21/2015  . Orthostatic hypotension 02/21/2015  . Constipation 07/04/2014  . Preventative health care 01/07/2014  . Postoperative wound infection 06/16/2013  . Family history of prostate cancer 02/09/2013  . Fatigue 02/09/2013  . Low back pain 06/23/2012  . BENIGN POSITIONAL VERTIGO, HX OF 05/16/2009  . HYPERTENSION, MILD 07/26/2008  . EIC (epidermal inclusion cyst) 12/22/2007  . RESTLESS LEG SYNDROME, MILD 09/29/2007  . Sleep apnea 03/17/2007  . GLUCOSE INTOLERANCE 10/09/2006  . OBESITY, NOS 10/09/2006  . Depression  10/09/2006  . Allergic rhinitis 10/09/2006  . ASTHMA, INTERMITTENT 10/09/2006  . GASTROESOPHAGEAL REFLUX, NO ESOPHAGITIS 10/09/2006  . IRRITABLE BOWEL SYNDROME 10/09/2006    Carney Living PT DPT  02/01/2016, 2:56 PM  [facility] [address] [place], [place], [address] Phone: [phone]   Fax:  [phone]  Name: Patrick Oconnor MRN: [id number] Date of Birth: [birth year]

## 2016-02-08 ENCOUNTER — Ambulatory Visit: Payer: 59 | Admitting: Physical Therapy

## 2016-02-08 DIAGNOSIS — M25511 Pain in right shoulder: Secondary | ICD-10-CM | POA: Diagnosis not present

## 2016-02-08 DIAGNOSIS — M25611 Stiffness of right shoulder, not elsewhere classified: Secondary | ICD-10-CM | POA: Diagnosis not present

## 2016-02-08 DIAGNOSIS — M6281 Muscle weakness (generalized): Secondary | ICD-10-CM | POA: Diagnosis not present

## 2016-02-08 NOTE — Therapy (Addendum)
[place], [place], [address] Phone: [phone]   Fax:  [phone]  Physical Therapy Treatment  Patient Details  Name: Patrick Oconnor MRN: [id number] Date of Birth: 1966-02-20 Referring Provider: Dr Justice Britain   Encounter Date: 02/08/2016      PT End of Session - 02/08/16 1338    Visit Number 10   Number of Visits 24   Date for PT Re-Evaluation 03/13/16   Authorization Type [facility] employee    PT Start Time 0130   PT Stop Time 0220   PT Time Calculation (min) 50 min      Past Medical History  Diagnosis Date  . GERD (gastroesophageal reflux disease)   . Sleep apnea 9/08    Started CPAP; does not wear CPAP nightly  . PONV (postoperative nausea and vomiting)   . Hypertension   . Pneumonia     hx of  . Asthma, exercise induced   . IBS (irritable bowel syndrome)   . Environmental allergies   . Depression   . History of bronchitis   . Anemia   . Degenerative disc disease, lumbar     Past Surgical History  Procedure Laterality Date  . Pyloroplasty  infant    pyloric stenosis  . Tonsillectomy and adenoidectomy    . Pilonidal cystectomy  42003    x 2  . Thrombosed external hemorrhoid  9/08    incised  . Esophagogastroduodenoscopy  3/09    normal; multiple  . Colonoscopy  08/12/2005    "multiple"  . Acromio-clavicular joint repair Right 04/15/2013    Procedure: RIGHT SHOULDER ACROMIO-CLAVICULAR RECONSTRUCTION WITH ALLOGRAFT   ;  Surgeon: Marin Shutter, MD;  Location: [facility];  Service: Orthopedics;  Laterality: Right;  . Septoplasty    . Incision and drainage Right 06/03/2013    Procedure: INCISION AND DRAINAGE RIGHT SHOULDER;  Surgeon: Marin Shutter, MD;  Location: [facility];  Service: Orthopedics;  Laterality: Right;  . Acromio-clavicular joint repair Right 06/03/2013    Procedure: ACROMIO-CLAVICULAR JOINT REPAIR;  Surgeon: Marin Shutter, MD;  Location: [facility];  Service: Orthopedics;   Laterality: Right;  . Shoulder open rotator cuff repair Right 10/19/2015    Procedure: OPEN RIGHT SHOULDER ROTATOR CUFF REPAIR ;  Surgeon: Justice Britain, MD;  Location: [facility];  Service: Orthopedics;  Laterality: Right;  . Acromio-clavicular joint repair Right 10/19/2015    Procedure: OPEN RIGHT SHOULDER ACROMIO-CLAVICULAR JOINT RECONSTRUCTION ;  Surgeon: Justice Britain, MD;  Location: [facility];  Service: Orthopedics;  Laterality: Right;  . Esophagogastroduodenoscopy (egd) with propofol N/A 01/22/2016    Procedure: ESOPHAGOGASTRODUODENOSCOPY (EGD) WITH PROPOFOL;  Surgeon: Laurence Spates, MD;  Location: [facility];  Service: Endoscopy;  Laterality: N/A;    There were no vitals filed for this visit.      Subjective Assessment - 02/08/16 1341    Subjective We need to work on getting this pain out of my shoulder   Currently in Pain? Yes   Pain Score 3    Pain Location Shoulder   Pain Orientation Right   Pain Frequency Constant   Aggravating Factors  stabilizing patients, lifting patients.    Pain Relieving Factors rest            [facility] PT Assessment - 02/08/16 0001    PROM   Right Shoulder Flexion 165 Degrees   Right Shoulder Internal Rotation 60 Degrees  T-10   Right Shoulder External Rotation 90 Degrees   Strength  Right Shoulder Flexion 4/5   Right Shoulder ABduction 4-/5                     [facility] Adult PT Treatment/Exercise - 02/08/16 0001    Shoulder Exercises: Supine   Other Supine Exercises supine body blade bil and single UE body blade for flexion, pullovers   Shoulder Exercises: Standing   Internal Rotation Limitations with body blade x 5 to 90 degrees   Flexion Limitations with body blade x 5 to 90 degrees   ABduction Limitations with body blade x 5 to 90 degrees   Other Standing Exercises body blade ER x 5    Shoulder Exercises: ROM/Strengthening   UBE (Upper Arm Bike) L 3 x 6 min   Other ROM/Strengthening Exercises sleeper stretch 1 x 60 sec    Cryotherapy    Number Minutes Cryotherapy 10 Minutes   Cryotherapy Location Shoulder   Type of Cryotherapy Ice pack   Manual Therapy   Manual Therapy Soft tissue mobilization   Joint Mobilization Grade 2/3 A/P and inferior to right GH jt , sidelying scap mobs   Soft tissue mobilization sub scap lateral border very tender, trigger point release x 3   Passive ROM Flexion,ER, IR, abduction                   PT Short Term Goals - 01/11/16 1609    PT SHORT TERM GOAL #1   Title Pt's shoulder strength will be measured at 4/5    Baseline 4/5 in flexion    Time 6   Period Weeks   Status Achieved   PT SHORT TERM GOAL #2   Title Pt will be I w/ HEP    Time 6   Period Weeks   Status Achieved   PT SHORT TERM GOAL #3   Title Pt will increase passive right shoulder flexion range of motion to 70 degrees   Baseline 165 degrees    Time 6   Period Weeks   Status Achieved   PT SHORT TERM GOAL #4   Title Pt will increase passive right shoulder flexion to 160 degrees    Baseline 165 degrees    Time 6   Period Weeks   Status Achieved           PT Long Term Goals - 02/01/16 1453    PT LONG TERM GOAL #1   Title Patient will reach overhead to a cabinet without pain in order to perfrom IADL's    Baseline perfroming active flexion but still weak    Time 12   Period Weeks   Status Achieved   PT LONG TERM GOAL #2   Title Patient will reach behind his back to t-8 in order to perfrom ADL's    Baseline t-10 today    Time 12   Period Weeks   Status On-going   PT LONG TERM GOAL #3   Title Patient will transfer patient with gait belt without increased pain in order to return to work.   Baseline 1/10   Period Weeks   Status Partially Met   PT LONG TERM GOAL #4   Title Pt will demosntrate 5/5 right shoulder strength with good endurance in order to return to work.    Time 12   Period Weeks   Status On-going               Plan - 02/08/16 1614    Clinical Impression Statement  Increased shoulder  strength however he is now having more constant pain at 3/10. Manual ond mobilizations decreased pan today. Increased PROM.    PT Next Visit Plan strength, endurance, mobs, manual, check AROM, ionto?      Patient will benefit from skilled therapeutic intervention in order to improve the following deficits and impairments:  Decreased activity tolerance, Decreased mobility, Decreased strength, Pain, Impaired tone  Visit Diagnosis: Pain in right shoulder  Muscle weakness (generalized)  Stiffness of right shoulder, not elsewhere classified     Problem List Patient Active Problem List   Diagnosis Date Noted  . Headache 04/27/2015  . Anemia, iron deficiency 02/28/2015  . Arthritis 02/21/2015  . Orthostatic hypotension 02/21/2015  . Constipation 07/04/2014  . Preventative health care 01/07/2014  . Postoperative wound infection 06/16/2013  . Family history of prostate cancer 02/09/2013  . Fatigue 02/09/2013  . Low back pain 06/23/2012  . BENIGN POSITIONAL VERTIGO, HX OF 05/16/2009  . HYPERTENSION, MILD 07/26/2008  . EIC (epidermal inclusion cyst) 12/22/2007  . RESTLESS LEG SYNDROME, MILD 09/29/2007  . Sleep apnea 03/17/2007  . GLUCOSE INTOLERANCE 10/09/2006  . OBESITY, NOS 10/09/2006  . Depression 10/09/2006  . Allergic rhinitis 10/09/2006  . ASTHMA, INTERMITTENT 10/09/2006  . GASTROESOPHAGEAL REFLUX, NO ESOPHAGITIS 10/09/2006  . IRRITABLE BOWEL SYNDROME 10/09/2006     The patient had made good progress. He was back to work and had a full HEP. He was to schedule if needed. He did not require furter therapy. D/C  PHYSICAL THERAPY DISCHARGE SUMMARY  Visits from Start of Care: 10  Current functional level related to goals / functional outcomes: Improved strength and function of shoulder    Remaining deficits: Still some residual weakness    Education / Equipment: HEP  Plan: Patient agrees to discharge.  Patient goals were met. Patient is being  discharged due to not returning since the last visit.  ?????      Dorene Ar, [place] 02/08/2016, 4:22 PM  [facility] [address] [place], [place], [address] Phone: [phone]   Fax:  [phone]  Name: Patrick Oconnor MRN: [id number] Date of Birth: 05-04-1966

## 2016-02-15 ENCOUNTER — Ambulatory Visit: Payer: 59 | Admitting: Physical Therapy

## 2016-02-19 MED FILL — rOPINIRole HCL 0.25 MG TABS: 0.25 | 30 days supply | Qty: 30 | Fill #1

## 2016-02-26 DIAGNOSIS — M5412 Radiculopathy, cervical region: Secondary | ICD-10-CM | POA: Diagnosis not present

## 2016-02-26 DIAGNOSIS — M47812 Spondylosis without myelopathy or radiculopathy, cervical region: Secondary | ICD-10-CM | POA: Insufficient documentation

## 2016-02-26 DIAGNOSIS — Z6832 Body mass index (BMI) 32.0-32.9, adult: Secondary | ICD-10-CM | POA: Diagnosis not present

## 2016-02-26 DIAGNOSIS — M542 Cervicalgia: Secondary | ICD-10-CM | POA: Diagnosis not present

## 2016-02-26 MED FILL — traMADol HCL 50 MG TABS: 50 | 15 days supply | Qty: 60 | Fill #0

## 2016-02-26 MED FILL — METHYLPREDNISOLONE 4 MG TAB: 4 | 6 days supply | Qty: 21 | Fill #0

## 2016-03-06 MED FILL — HYDROCODON-APAP 10-325: 10-325 | 13 days supply | Qty: 50 | Fill #0

## 2016-03-20 ENCOUNTER — Ambulatory Visit (INDEPENDENT_AMBULATORY_CARE_PROVIDER_SITE_OTHER): Payer: 59 | Admitting: Family Medicine

## 2016-03-20 ENCOUNTER — Encounter: Payer: Self-pay | Admitting: Family Medicine

## 2016-03-20 VITALS — BP 116/75 | HR 71 | Temp 98.1°F | Wt 256.0 lb

## 2016-03-20 DIAGNOSIS — J029 Acute pharyngitis, unspecified: Secondary | ICD-10-CM | POA: Diagnosis not present

## 2016-03-20 DIAGNOSIS — J069 Acute upper respiratory infection, unspecified: Secondary | ICD-10-CM | POA: Insufficient documentation

## 2016-03-20 LAB — POCT RAPID STREP A (OFFICE): RAPID STREP A SCREEN: NEGATIVE

## 2016-03-20 MED ORDER — AMOXICILLIN-POT CLAVULANATE 875-125 MG PO TABS: 1.0000 | ORAL_TABLET | Freq: Two times a day (BID) | ORAL | 0 refills | Status: DC

## 2016-03-20 MED FILL — AMOX-CLAV 875-125 MG TABLET: 875-125 | 10 days supply | Qty: 20 | Fill #0

## 2016-03-20 NOTE — Assessment & Plan Note (Signed)
Patient is here with complaints of an upper respiratory infection. He states that initial symptoms began approximately 13 days ago. Etiology at this time highly suspicious for bacterial superinfection with patient reporting symptoms improving only to acutely worsen halfway through his current presentation. Currently has dark brown sputum. Rapid strep negative. - Augmentin twice a day 10 days - Tylenol/ibuprofen for discomfort - Stay well-hydrated

## 2016-03-20 NOTE — Progress Notes (Signed)
SORE THROAT  Sore throat began 13 days ago. Pain is: left side is worse. Ear is involved. Soft palate as well Severity: moderate Medications tried: OTC cold med Strep throat exposure: no but recent travel overseas. STD exposure: no  Symptoms Fever: subjective Cough: yes; productive Runny nose: yes Muscle aches: no Swollen Glands: no Trouble breathing: no Drooling: no Weight loss: no  Review of Symptoms - see HPI PMH - Smoking status noted.    CC, SH/smoking status, and VS noted  Objective: BP 116/75 (BP Location: Right Arm, Patient Position: Sitting, Cuff Size: Normal)   Pulse 71   Temp 98.1 F (36.7 C) (Oral)   Wt 256 lb (116.1 kg)   BMI 32.87 kg/m  Gen: NAD, alert, cooperative, and pleasant. HEENT: NCAT, EOMI, PERRL, right TM with effusion, left TM normal, OPV grossly erythematous and irritated without exudate. Bilateral LAD. CV: RRR, no murmur Resp: CTAB, no wheezes, non-labored   Assessment and plan:  URI (upper respiratory infection) Patient is here with complaints of an upper respiratory infection. He states that initial symptoms began approximately 13 days ago. Etiology at this time highly suspicious for bacterial superinfection with patient reporting symptoms improving only to acutely worsen halfway through his current presentation. Currently has dark brown sputum. Rapid strep negative. - Augmentin twice a day 10 days - Tylenol/ibuprofen for discomfort - Stay well-hydrated   Orders Placed This Encounter  Procedures  . POCT rapid strep A    Meds ordered this encounter  Medications  . amoxicillin-clavulanate (AUGMENTIN) 875-125 MG tablet    Sig: Take 1 tablet by mouth 2 (two) times daily.    Dispense:  20 tablet    Refill:  0     Patrick Leatherwood, MD,MS,  PGY3 03/20/2016 12:13 PM

## 2016-03-20 NOTE — Patient Instructions (Signed)
Cough Treatment - you should: -  Take over-the-counter ibuprofen or Tylenol as directed on the bottles for fever, pain, and/or inflammation. - I prescribed to Augmentin. Take 1 tablet twice a day for the next 10 days. -  You can pickup some pseudoephedrine (Sudafed) from your local pharmacy. This may help with most of your symptoms while you are at work. However, I would recommend not taking this before bedtime as it has a tendency to keep people awake.  Also, only take this when surely necessary as it can cause some "rebound effects" with worsening congestion and runny nose. -  You may want to try a teaspoon of honey before bed every night. This may help with the sore throat.  You should be better in: 5-7 days Call us if you have severe shortness of breath, high fever or are not better in 2 weeks

## 2016-04-09 ENCOUNTER — Other Ambulatory Visit: Payer: Self-pay | Admitting: Family Medicine

## 2016-04-09 MED FILL — CITALOPRAM HBR 20 MG TABLET: 20 | 90 days supply | Qty: 90 | Fill #1

## 2016-04-09 MED FILL — OMEPRAZOLE DR 20 MG CAPSULE: 20 | 90 days supply | Qty: 90 | Fill #0

## 2016-04-09 MED FILL — HYDROCHLOROTHIAZIDE 25 MG T: 25 | 90 days supply | Qty: 90 | Fill #0

## 2016-06-04 MED FILL — rOPINIRole HCL 0.25 MG TABS: 0.25 | 30 days supply | Qty: 30 | Fill #2

## 2016-06-27 NOTE — Progress Notes (Deleted)
   Subjective:    Patient ID: Patrick Oconnor, male    DOB: May 03, 1966, 50 y.o.   MRN: [id number]  HPI 50 y/o male presents for routine follow up.  Depression Currently on Celexa 20 mg Daily  RLS Currently on Iron supplementation and Requip  HM Due for Flu vaccine and Hep C screen   Review of Systems     Objective:   Physical Exam There were no vitals taken for this visit.        Assessment & Plan:  No problem-specific Assessment & Plan notes found for this encounter.

## 2016-06-28 ENCOUNTER — Ambulatory Visit (INDEPENDENT_AMBULATORY_CARE_PROVIDER_SITE_OTHER): Payer: 59 | Admitting: Family Medicine

## 2016-06-28 ENCOUNTER — Encounter: Payer: Self-pay | Admitting: Family Medicine

## 2016-06-28 DIAGNOSIS — R202 Paresthesia of skin: Secondary | ICD-10-CM

## 2016-06-28 DIAGNOSIS — Z8042 Family history of malignant neoplasm of prostate: Secondary | ICD-10-CM

## 2016-06-28 DIAGNOSIS — R3911 Hesitancy of micturition: Secondary | ICD-10-CM

## 2016-06-28 DIAGNOSIS — Z0000 Encounter for general adult medical examination without abnormal findings: Secondary | ICD-10-CM

## 2016-06-28 DIAGNOSIS — M545 Low back pain, unspecified: Secondary | ICD-10-CM

## 2016-06-28 DIAGNOSIS — E781 Pure hyperglyceridemia: Secondary | ICD-10-CM

## 2016-06-28 DIAGNOSIS — K219 Gastro-esophageal reflux disease without esophagitis: Secondary | ICD-10-CM

## 2016-06-28 DIAGNOSIS — D500 Iron deficiency anemia secondary to blood loss (chronic): Secondary | ICD-10-CM

## 2016-06-28 DIAGNOSIS — M79601 Pain in right arm: Secondary | ICD-10-CM

## 2016-06-28 DIAGNOSIS — M79602 Pain in left arm: Secondary | ICD-10-CM

## 2016-06-28 LAB — COMPLETE METABOLIC PANEL WITH GFR
ALT: 49 U/L — ABNORMAL HIGH (ref 9–46)
AST: 38 U/L — AB (ref 10–35)
Albumin: 4.3 g/dL (ref 3.6–5.1)
Alkaline Phosphatase: 86 U/L (ref 40–115)
BUN: 19 mg/dL (ref 7–25)
CALCIUM: 9.2 mg/dL (ref 8.6–10.3)
CHLORIDE: 102 mmol/L (ref 98–110)
CO2: 28 mmol/L (ref 20–31)
Creat: 0.76 mg/dL (ref 0.70–1.33)
GFR, Est Non African American: >89 mL/min (ref >=60)
Glucose, Bld: 101 mg/dL — ABNORMAL HIGH (ref 65–99)
POTASSIUM: 4 mmol/L (ref 3.5–5.3)
Sodium: 141 mmol/L (ref 135–146)
Total Bilirubin: 0.4 mg/dL (ref 0.2–1.2)
Total Protein: 6.8 g/dL (ref 6.1–8.1)

## 2016-06-28 LAB — LIPID PANEL
Cholesterol: 169 mg/dL (ref <200)
HDL: 24 mg/dL — ABNORMAL LOW (ref >40)
LDL CALC: 83 mg/dL (ref <100)
TRIGLYCERIDES: 310 mg/dL — AB (ref <150)
Total CHOL/HDL Ratio: 7 Ratio — ABNORMAL HIGH (ref <5.0)
VLDL: 62 mg/dL — AB (ref <30)

## 2016-06-28 LAB — CBC WITH DIFFERENTIAL/PLATELET
BASOS ABS: 63 {cells}/uL (ref 0–200)
Basophils Relative: 1 %
EOS PCT: 4 %
Eosinophils Absolute: 252 cells/uL (ref 15–500)
HCT: 41 % (ref 38.5–50.0)
Hemoglobin: 14.1 g/dL (ref 13.2–17.1)
Lymphocytes Relative: 23 %
Lymphs Abs: 1449 cells/uL (ref 850–3900)
MCH: 29.1 pg (ref 27.0–33.0)
MCHC: 34.4 g/dL (ref 32.0–36.0)
MCV: 84.7 fL (ref 80.0–100.0)
MONOS PCT: 8 %
MPV: 9.8 fL (ref 7.5–12.5)
Monocytes Absolute: 504 cells/uL (ref 200–950)
NEUTROS ABS: 4032 {cells}/uL (ref 1500–7800)
Neutrophils Relative %: 64 %
PLATELETS: 211 10*3/uL (ref 140–400)
RBC: 4.84 MIL/uL (ref 4.20–5.80)
RDW: 14.1 % (ref 11.0–15.0)
WBC: 6.3 10*3/uL (ref 3.8–10.8)

## 2016-06-28 LAB — PSA: PSA: 0.2 ng/mL (ref <=4.0)

## 2016-06-28 MED ORDER — PREGABALIN 50 MG PO CAPS: 50.0000 mg | ORAL_CAPSULE | Freq: Two times a day (BID) | ORAL | 2 refills | Status: DC

## 2016-06-28 NOTE — Patient Instructions (Signed)
It was nice to see you today.   Dr. Ree Kida will call you with your lab results.  Start Lyrica 50 mg BID for pain and radicular symptoms.   Please follow up for your mood and generalized fatigue.

## 2016-06-28 NOTE — Progress Notes (Signed)
50 y.o. year old male presents for preventative visit and annual physical.   Acute Concerns: PUD and Genal Pain (see below)  Health Maintenance Hep C screen  GERD/PUD/Anemia Identified on recent Endo, Taking 20 mg Omeprazole daily, limiting NSAID use, stopped taking Mobic as well, does have reflux if stops Omeprazole; has continued to take iron tablets due to related IDA  General pain/OA Ibuprofen works best, mostly back with some radiation down legs (both legs), also reports paraesthesias of arm. Occasional neck pain.    Diet: Does not adhere to specific diet, eats what he can get at the hospital. Eats out a lot.   Exercise: Works as Secretary/administrator, no regular exercise, does walk dog twice per week  Sexual History:  No current sexual partner  POA/Living Will: Does not have living will (has [employer] form).   Social:  Social History   Social History  . Marital status: Single    Spouse name: N/A  . Number of children: N/A  . Years of education: 65   Occupational History  . physical therapist [employer]   Social History Main Topics  . Smoking status: Never Smoker  . Smokeless tobacco: Never Used  . Alcohol use 0.0 oz/week     Comment: rarely  . Drug use: No  . Sexual activity: Not Currently   Other Topics Concern  . Not on file   Social History Narrative   Native of [place], [place]   Always single          Immunization: Immunization History  Administered Date(s) Administered  . Influenza Whole 05/26/2007  . Influenza-Unspecified 05/12/2013, 06/28/2016  . Pneumococcal Polysaccharide-23 06/12/2002  . Td 11/10/2001  . Tdap 03/13/2012  Due for flu vaccine  Cancer Screening:  Colonoscopy:  Completed 08/2014  Prostate: mostly good flow, however if gains weight occasional Decreased flow, no dysuria, no hematuria, no nocturia, would like PSA and DRE; father passed of prostate CA (around 31).   EGD 01/22/16 Gastritis/PUD present  ROS No fevers, no chills, no chest pain, no  sob, no nausea, no emesis, no diarrhea, no constipation   Physical Exam: BP 126/80  GEN: Pleasant male, NAD HEENT: Normocephalic, PERRL, EOMI, no scleral icterus, bilateral TM pearly grey, nasal septum midline, MMM, uvula midline, no anterior or posterior lymphadenopathy, no thyromegaly CARDIAC:RRR, S1 and S2 present, no murmur, no heaves/thrills RESP: CTAB, normal effort ABD: soft, no tenderness, normal bowel sounds  EXT: No edema, 2+ radial and DP pulses SKIN: no rash GU/Rectal: normal circumcised penis, no testicular masses, no inguinal hernia, external rectum unremarkable, prostate normal size without nodules, no internal masses present MSK: Cervical - ROM is full, no tenderness; Lumbar - no tenderness, SLT negative bilaterally Neuro: CN2-12 intact, strength 5/5 in all extremities, sensation intact to light touch in all extremities  ASSESSMENT & PLAN: 50 y.o. male presents for annual preventative exam. Please see problem specific assessment and plan.   Preventative health care 50 y/o male presents for preventative exam. -Up to date on immunizations -Hep C screen obtained -Basic Labs obtained -Patient has POA/Living Will form and plans to complete -Up to date on colon cancer screening -DRE unremarkable today, check PSA given urinary symptoms and FH of prostate CA  Family history of prostate cancer DRE unremarkable today, check PSA given urinary symptoms and FH of prostate CA  Low back pain potentially associated with radiculopathy Patient reports occasional low back pain with radicular symptoms to both legs. NO red flag signs/symptoms. -no indication for imaging at this  time -trial of Lyrica  GASTROESOPHAGEAL REFLUX, NO ESOPHAGITIS EGD in 01/2016 showed gastritis. -continue PPI -avoid NSAID's   Paresthesia and pain of both upper extremities Intermittent paresthesias of bilateral arms. MSK and Neuro Exam unremarkable.  -trial of Lyrica  Urinary hesitancy DRE  unremarkable. Check PSA due to family history of prostate cancer.   Anemia, iron deficiency Due to gastritis -Check CBC to monitor.

## 2016-06-29 ENCOUNTER — Encounter: Payer: Self-pay | Admitting: Family Medicine

## 2016-06-29 DIAGNOSIS — M79601 Pain in right arm: Secondary | ICD-10-CM | POA: Insufficient documentation

## 2016-06-29 DIAGNOSIS — R7989 Other specified abnormal findings of blood chemistry: Secondary | ICD-10-CM | POA: Insufficient documentation

## 2016-06-29 DIAGNOSIS — R202 Paresthesia of skin: Secondary | ICD-10-CM | POA: Insufficient documentation

## 2016-06-29 DIAGNOSIS — R945 Abnormal results of liver function studies: Secondary | ICD-10-CM

## 2016-06-29 DIAGNOSIS — M79602 Pain in left arm: Secondary | ICD-10-CM

## 2016-06-29 LAB — HEPATITIS C ANTIBODY: HCV Ab: NEGATIVE

## 2016-06-29 NOTE — Assessment & Plan Note (Signed)
Due to gastritis -Check CBC to monitor.

## 2016-06-29 NOTE — Assessment & Plan Note (Signed)
Patient reports occasional low back pain with radicular symptoms to both legs. NO red flag signs/symptoms. -no indication for imaging at this time -trial of Lyrica

## 2016-06-29 NOTE — Assessment & Plan Note (Signed)
50 y/o male presents for preventative exam. -Up to date on immunizations -Hep C screen obtained -Basic Labs obtained -Patient has POA/Living Will form and plans to complete -Up to date on colon cancer screening -DRE unremarkable today, check PSA given urinary symptoms and FH of prostate CA

## 2016-06-29 NOTE — Assessment & Plan Note (Signed)
EGD in 01/2016 showed gastritis. -continue PPI -avoid NSAID's

## 2016-06-29 NOTE — Assessment & Plan Note (Signed)
DRE unremarkable today, check PSA given urinary symptoms and FH of prostate CA

## 2016-06-29 NOTE — Assessment & Plan Note (Signed)
DRE unremarkable. Check PSA due to family history of prostate cancer.

## 2016-06-29 NOTE — Assessment & Plan Note (Signed)
Intermittent paresthesias of bilateral arms. MSK and Neuro Exam unremarkable.  -trial of Lyrica

## 2016-07-02 MED FILL — LYRICA 50 MG CAPSULE: 50 | 30 days supply | Qty: 60 | Fill #0

## 2016-07-10 ENCOUNTER — Other Ambulatory Visit: Payer: Self-pay | Admitting: Family Medicine

## 2016-07-10 MED FILL — OMEPRAZOLE DR 20 MG CAPSULE: 20 | 90 days supply | Qty: 90 | Fill #1

## 2016-07-10 MED FILL — CITALOPRAM HBR 20 MG TABLET: 20 | 90 days supply | Qty: 90 | Fill #0

## 2016-07-10 MED FILL — HYDROCHLOROTHIAZIDE 25 MG T: 25 | 90 days supply | Qty: 90 | Fill #1

## 2016-10-07 ENCOUNTER — Other Ambulatory Visit: Payer: Self-pay | Admitting: Family Medicine

## 2016-10-07 DIAGNOSIS — G2581 Restless legs syndrome: Secondary | ICD-10-CM

## 2016-10-07 MED FILL — rOPINIRole HCL 0.25 MG TABS: 0.25 | 30 days supply | Qty: 30 | Fill #0

## 2016-10-07 MED FILL — CITALOPRAM HBR 20 MG TABLET: 20 | 90 days supply | Qty: 90 | Fill #1

## 2016-10-07 MED FILL — HYDROCHLOROTHIAZIDE 25 MG T: 25 | 90 days supply | Qty: 90 | Fill #0

## 2016-10-07 MED FILL — OMEPRAZOLE DR 20 MG CAPSULE: 20 | 90 days supply | Qty: 90 | Fill #0

## 2017-01-09 MED FILL — OMEPRAZOLE DR 20 MG CAPSULE: 20 | 90 days supply | Qty: 90 | Fill #1

## 2017-01-09 MED FILL — HYDROCHLOROTHIAZIDE 25 MG T: 25 | 90 days supply | Qty: 90 | Fill #1

## 2017-01-09 MED FILL — CITALOPRAM HBR 20 MG TABLET: 20 | 90 days supply | Qty: 90 | Fill #2

## 2017-02-11 MED FILL — rOPINIRole HCL 0.25 MG TABS: 0.25 | 30 days supply | Qty: 30 | Fill #1

## 2017-02-13 ENCOUNTER — Telehealth: Payer: Self-pay | Admitting: Family Medicine

## 2017-02-13 ENCOUNTER — Telehealth: Payer: 59 | Admitting: Family

## 2017-02-13 DIAGNOSIS — L237 Allergic contact dermatitis due to plants, except food: Secondary | ICD-10-CM

## 2017-02-13 MED ORDER — PREDNISONE 10 MG (21) PO TBPK: ORAL_TABLET | ORAL | 0 refills | Status: DC

## 2017-02-13 MED FILL — predniSONE 10 MG TABS: 10 | 6 days supply | Qty: 21 | Fill #0

## 2017-02-13 NOTE — Telephone Encounter (Signed)
I am covering for Dr. Mingo Amber who is away from the office.  He'll need to come in for an appointment to be seen. Please call patient and schedule appointment.  Thanks Leeanne Rio, MD

## 2017-02-13 NOTE — Telephone Encounter (Signed)
Has poison ivy on arm and both legs at knees.  Is continuing to grow. Would like steriod shot. Please advise

## 2017-02-13 NOTE — Progress Notes (Signed)

## 2017-02-13 NOTE — Telephone Encounter (Signed)
LVM for pt to call back to inform him of below. Zimmerman Rumple, April D, CMA  

## 2017-02-14 NOTE — Telephone Encounter (Signed)
Pt did an Evisit for this not sure if that was before or after this encounter.  If pt calls back please see if he still needs to be seen and if so assist in getting an appointment scheduled. Katharina Caper, April D, [place]

## 2017-02-21 NOTE — Telephone Encounter (Signed)
Agree 

## 2017-03-18 MED FILL — rOPINIRole HCL 0.25 MG TABS: 0.25 | 30 days supply | Qty: 30 | Fill #2

## 2017-04-11 MED FILL — CITALOPRAM HBR 20 MG TABLET: 20 | 90 days supply | Qty: 90 | Fill #3

## 2017-04-18 ENCOUNTER — Telehealth: Payer: Self-pay | Admitting: Family Medicine

## 2017-04-18 DIAGNOSIS — G2581 Restless legs syndrome: Secondary | ICD-10-CM

## 2017-04-18 MED ORDER — OMEPRAZOLE 20 MG PO CPDR: 20.0000 mg | DELAYED_RELEASE_CAPSULE | Freq: Every day | ORAL | 1 refills | Status: DC

## 2017-04-18 MED ORDER — ROPINIROLE HCL 0.25 MG PO TABS
0.2500 mg | ORAL_TABLET | Freq: Every day | ORAL | 2 refills | Status: DC
Start: 2017-04-18 — End: 2017-07-23

## 2017-04-18 MED ORDER — HYDROCHLOROTHIAZIDE 25 MG PO TABS: 25.0000 mg | ORAL_TABLET | Freq: Every day | ORAL | 1 refills | Status: DC

## 2017-04-18 MED FILL — HYDROCHLOROTHIAZIDE 25 MG T: 25 | 90 days supply | Qty: 90 | Fill #0

## 2017-04-18 MED FILL — OMEPRAZOLE 20 MG CAP: 20 | 90 days supply | Qty: 90 | Fill #0

## 2017-04-18 MED FILL — rOPINIRole HCL 0.25 MG TABS: 0.25 | 30 days supply | Qty: 30 | Fill #0

## 2017-04-18 NOTE — Telephone Encounter (Signed)
Pt called because he said that the pharmacy has been calling and faxing since the beginning of this week to get refills on his Requip, Prilosec, and hydrochlorothiazide called in. He only has 1 pill left for tonight and he does not want to be all weekend with out this. jw

## 2017-04-18 NOTE — Telephone Encounter (Signed)
Done

## 2017-05-20 MED FILL — rOPINIRole HCL 0.25 MG TABS: 0.25 | 30 days supply | Qty: 30 | Fill #1

## 2017-06-18 IMAGING — MR MR SHOULDER*R* W/O CM
4 of 6 series · 18 of 40 positions shown · non-contrast
Comparison: 06/03/2013

CLINICAL DATA: Right shoulder pain for 1 week. Surgery in 0668.
Prior distal clavicular resection and coracoclavicular
reconstruction.

EXAM:
MRI OF THE RIGHT SHOULDER WITHOUT CONTRAST
TECHNIQUE: Multiplanar, multisequence MR imaging of the shoulder was performed.
No intravenous contrast was administered.

[Series 3: T2 fat-sat · axial · 4.0mm · 0.25mm/px · z∈[+44,+114]mm · 6 of 20 slices shown (1 of 3)]
[im 1/20]
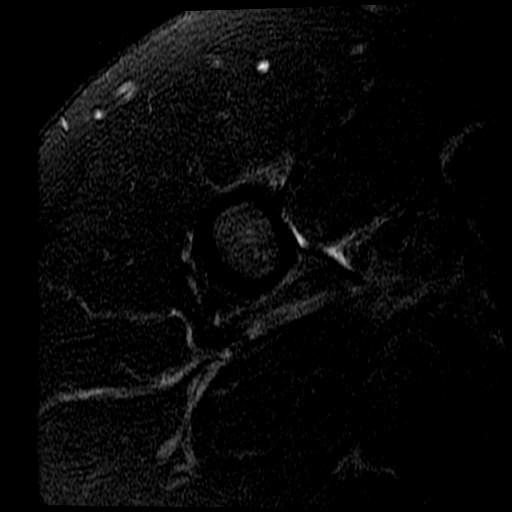
[im 4/20]
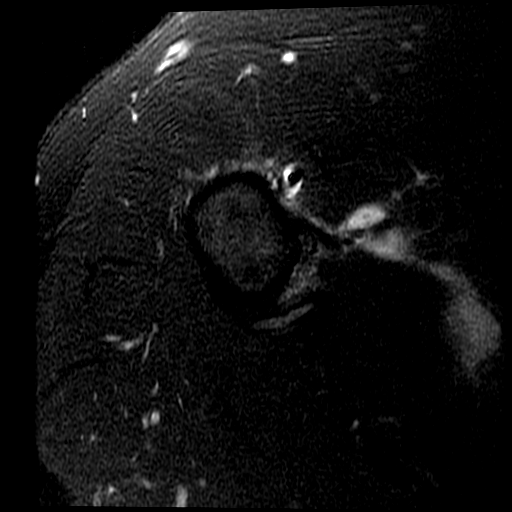
[im 7/20]
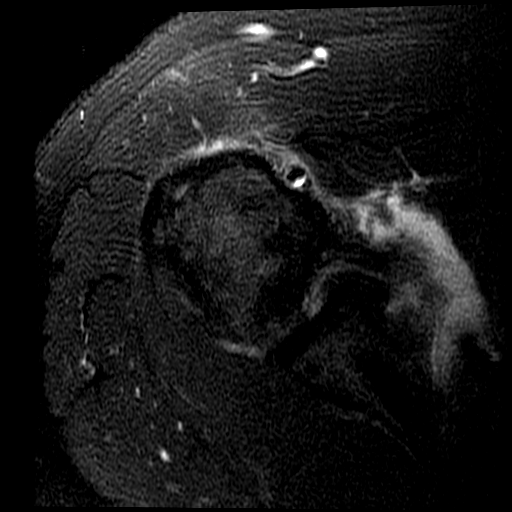
[im 10/20]
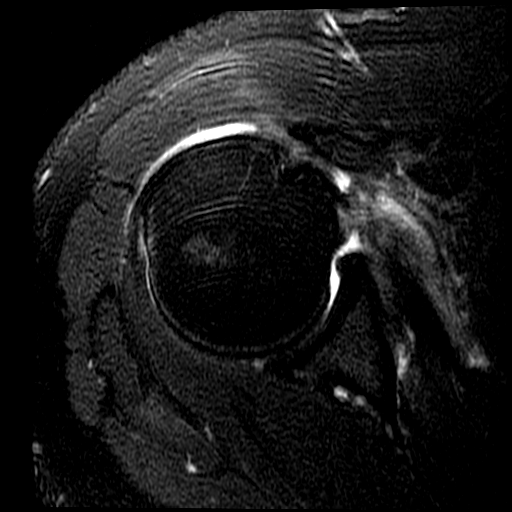
[im 13/20]
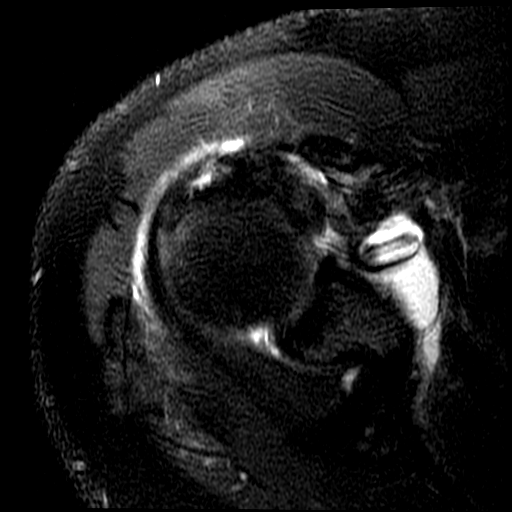
[im 16/20]
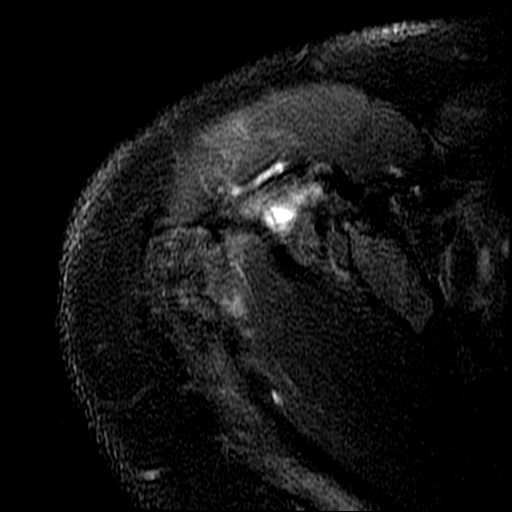

[Series 4: T2 fat-sat · oblique · 4.0mm · 0.29mm/px · 3 of 17 slices shown (2 of 3)]
[im 4/17]
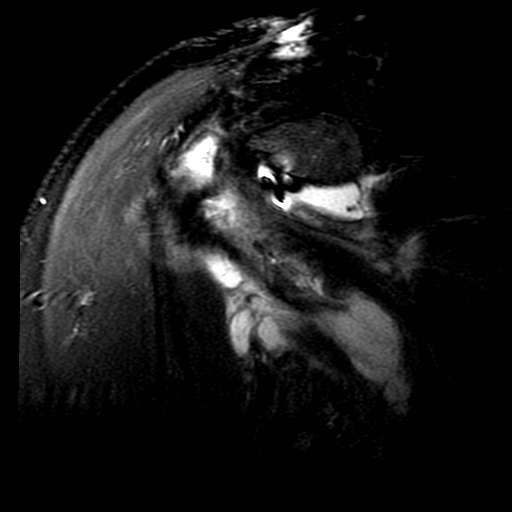
[im 10/17]
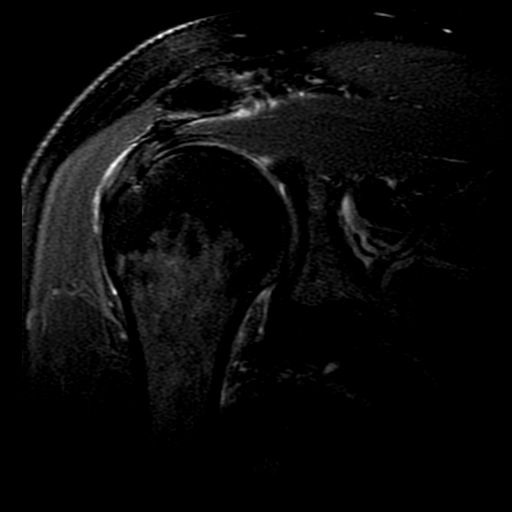
[im 17/17]
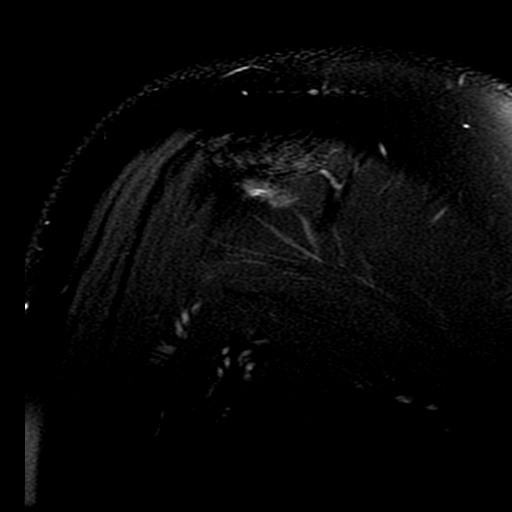

[Series 5: PD · oblique · 4.0mm · 0.29mm/px · 6 of 17 slices shown]
[im 1/17]
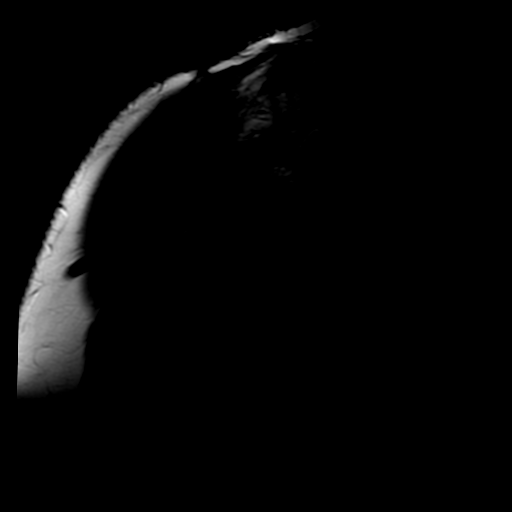
[im 4/17]
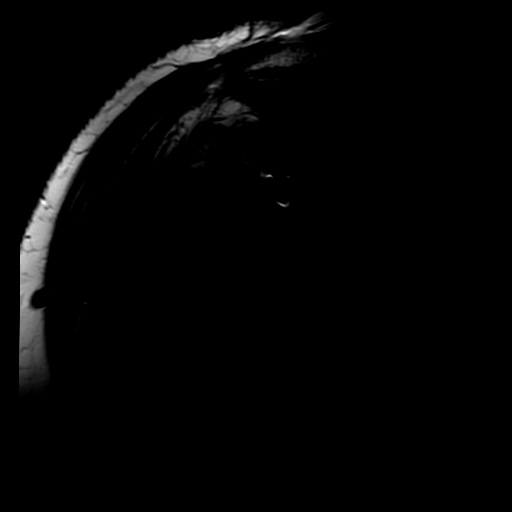
[im 7/17]
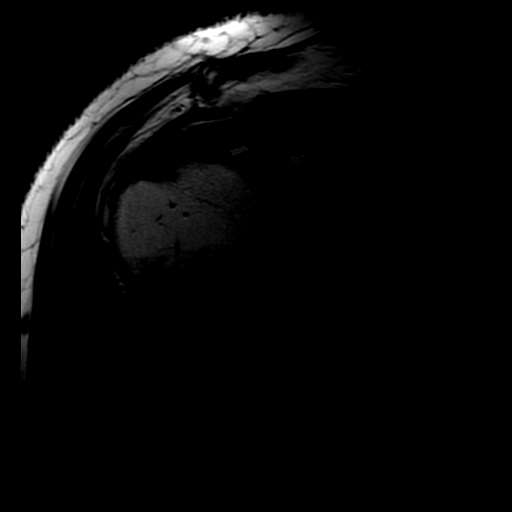
[im 10/17]
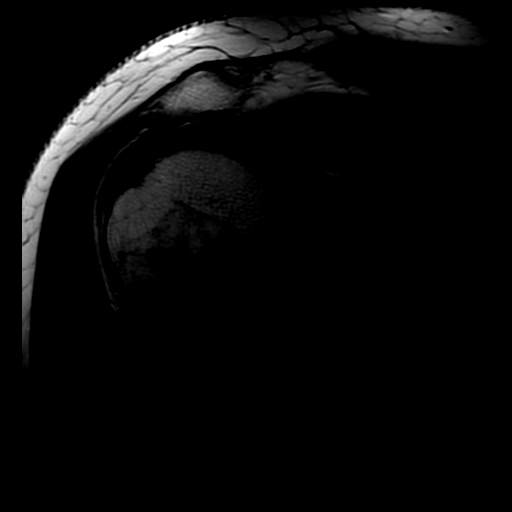
[im 13/17]
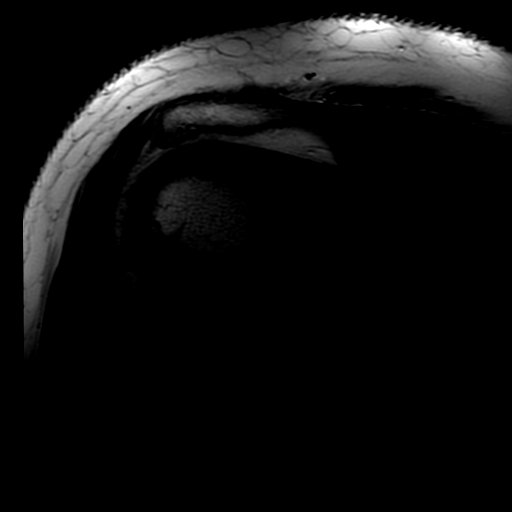
[im 17/17]
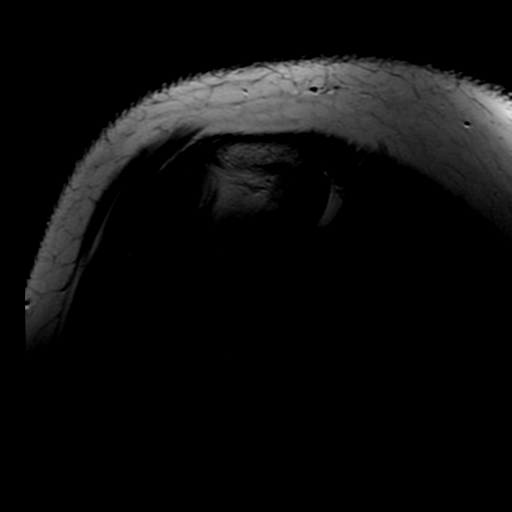

[Series 7: T2 fat-sat · oblique · 4.0mm · 0.29mm/px · 3 of 19 slices shown (3 of 3)]
[im 4/19]
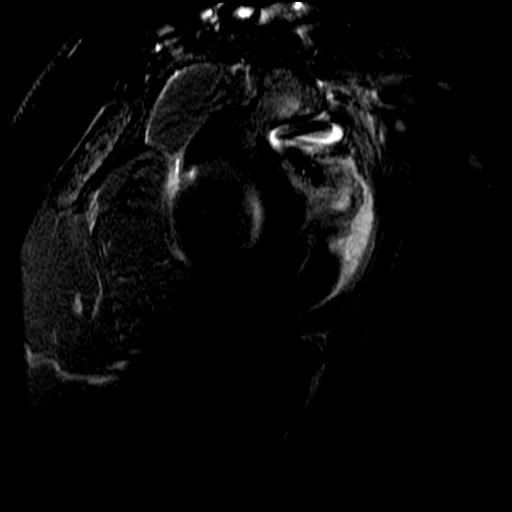
[im 10/19]
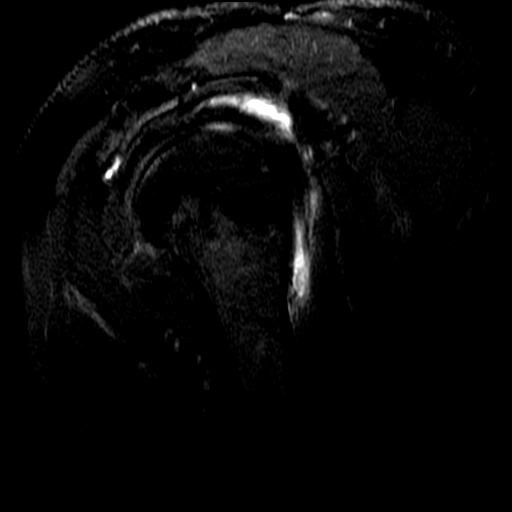
[im 16/19]
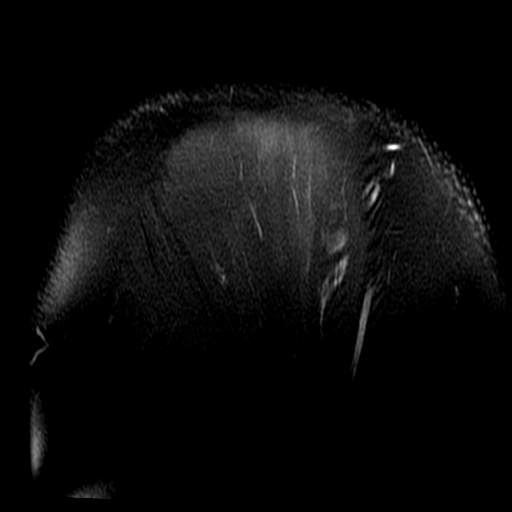

[18 of 40 positions shown; findings below may reference images not displayed]

FINDINGS: Despite efforts by the technologist and patient, motion artifact is
present on today's exam and could not be eliminated. This reduces
exam sensitivity and specificity.

Rotator cuff: Partial thickness intrasubstance tearing of the
supraspinatus tendon at its insertion, image 8 series 4,
questionable extension to the articular surface. Moderate
supraspinatus tendinopathy.

Full-thickness nearly full width tear of the subscapularis tendon
involving the upper [DATE] of the tendon, which is retracted about 2
cm. The lower portion of the subscapularis appears to successfully
attached to the humerus for example on image 5 series 4.

Muscles: Mild edema in the upper portion of the subscapularis
muscle.

Biceps long head:  Unremarkable

Acromioclavicular Joint: Distal clavicular resection. As expected
there is fluid in the subacromial subdeltoid bursa.

Glenohumeral Joint: Articular cartilage well maintained. Indistinct
humeral attachment of the inferior glenohumeral ligament.

Labrum: There is irregularity of the posterior superior labrum on
image 11 series 4 although not definitive for tear.

Bones: The patient has had coracoclavicular ligament reconstruction
and metal artifact likely from buttons noted along the upper margin
of the clavicle and the lower margin of the coracoid base. I suspect
that the graft/reconstruction is extending through an osseous tunnel
on image 3 of series 5. There may be a chronic distal coracoid tip
fragment detached from the rest of the coracoid on image 2 series 5.
IMPRESSION: 1. Large full-thickness partial width tear of the upper [DATE] of the
subscapularis tendon, retracted 2 cm. There is associated fluid in
the subacromial subdeltoid bursa and some low-level edema in the
upper portion of the subscapularis muscle.
2. Partial-thickness intrasubstance tearing of the distal
supraspinatus tendon. Moderate supraspinatus tendinopathy.
3. Postoperative findings from prior coracoclavicular ligament
reconstruction. Questionable chronic bony fragment along the tip of
the coracoid (separate from the bony tunnel). Prior distal
clavicular resection.

## 2017-06-20 MED FILL — rOPINIRole HCL 0.25 MG TABS: 0.25 | 30 days supply | Qty: 30 | Fill #2

## 2017-07-11 DIAGNOSIS — H5212 Myopia, left eye: Secondary | ICD-10-CM | POA: Diagnosis not present

## 2017-07-11 DIAGNOSIS — H5201 Hypermetropia, right eye: Secondary | ICD-10-CM | POA: Diagnosis not present

## 2017-07-11 DIAGNOSIS — H52223 Regular astigmatism, bilateral: Secondary | ICD-10-CM | POA: Diagnosis not present

## 2017-07-21 MED FILL — HYDROCHLOROTHIAZIDE 25 MG T: 25 | 90 days supply | Qty: 90 | Fill #1

## 2017-07-21 MED FILL — OMEPRAZOLE 20 MG CAP: 20 | 90 days supply | Qty: 90 | Fill #1

## 2017-07-23 ENCOUNTER — Other Ambulatory Visit: Payer: Self-pay | Admitting: Family Medicine

## 2017-07-23 DIAGNOSIS — G2581 Restless legs syndrome: Secondary | ICD-10-CM

## 2017-07-23 MED FILL — rOPINIRole HCL 0.25 MG TABS: 0.25 | 30 days supply | Qty: 30 | Fill #0

## 2017-07-25 ENCOUNTER — Other Ambulatory Visit: Payer: Self-pay | Admitting: *Deleted

## 2017-07-25 MED ORDER — CITALOPRAM HYDROBROMIDE 20 MG PO TABS
20.0000 mg | ORAL_TABLET | Freq: Every day | ORAL | 3 refills | Status: DC
Start: 2017-07-25 — End: 2018-08-14

## 2017-07-25 MED FILL — CITALOPRAM HBR 20 MG TABLET: 20 | 90 days supply | Qty: 90 | Fill #0

## 2017-08-07 ENCOUNTER — Encounter: Payer: Self-pay | Admitting: Family Medicine

## 2017-08-07 ENCOUNTER — Ambulatory Visit (INDEPENDENT_AMBULATORY_CARE_PROVIDER_SITE_OTHER): Payer: 59 | Admitting: Family Medicine

## 2017-08-07 VITALS — BP 124/74 | HR 76 | Temp 98.3°F | Ht 74.0 in | Wt 265.0 lb

## 2017-08-07 DIAGNOSIS — D509 Iron deficiency anemia, unspecified: Secondary | ICD-10-CM

## 2017-08-07 DIAGNOSIS — Z862 Personal history of diseases of the blood and blood-forming organs and certain disorders involving the immune mechanism: Secondary | ICD-10-CM | POA: Diagnosis not present

## 2017-08-07 DIAGNOSIS — F329 Major depressive disorder, single episode, unspecified: Secondary | ICD-10-CM | POA: Diagnosis not present

## 2017-08-07 DIAGNOSIS — Z7189 Other specified counseling: Secondary | ICD-10-CM

## 2017-08-07 DIAGNOSIS — F32A Depression, unspecified: Secondary | ICD-10-CM

## 2017-08-07 DIAGNOSIS — I10 Essential (primary) hypertension: Secondary | ICD-10-CM | POA: Diagnosis not present

## 2017-08-07 DIAGNOSIS — K5901 Slow transit constipation: Secondary | ICD-10-CM

## 2017-08-07 DIAGNOSIS — K589 Irritable bowel syndrome without diarrhea: Secondary | ICD-10-CM

## 2017-08-07 DIAGNOSIS — Z8042 Family history of malignant neoplasm of prostate: Secondary | ICD-10-CM

## 2017-08-07 DIAGNOSIS — J309 Allergic rhinitis, unspecified: Secondary | ICD-10-CM

## 2017-08-07 MED ORDER — HYDROCHLOROTHIAZIDE 25 MG PO TABS: 25.0000 mg | ORAL_TABLET | ORAL | Status: DC

## 2017-08-07 MED ORDER — FERROUS SULFATE 325 (65 FE) MG PO TABS: 325.0000 mg | ORAL_TABLET | ORAL | Status: DC

## 2017-08-07 MED ORDER — MELOXICAM 15 MG PO TABS: 15.0000 mg | ORAL_TABLET | Freq: Every day | ORAL | Status: DC | PRN

## 2017-08-07 MED ORDER — POLYETHYLENE GLYCOL 3350 17 GM/SCOOP PO POWD: 17.0000 g | Freq: Two times a day (BID) | ORAL | Status: AC | PRN

## 2017-08-07 MED ORDER — FLUTICASONE PROPIONATE 50 MCG/ACT NA SUSP: 2.0000 | Freq: Every day | NASAL | Status: DC | PRN

## 2017-08-07 MED ORDER — SENNA-DOCUSATE SODIUM 8.6-50 MG PO TABS: 1.0000 | ORAL_TABLET | Freq: Every day | ORAL | Status: AC | PRN

## 2017-08-07 NOTE — Patient Instructions (Signed)
Go to the lab on the way out.  We'll contact you with your lab report. Change the HCTZ to every other day.  See if you still have lightheaded episodes.   Update me in about 1 week.  Let me look back at your old records.   Take care.  Glad to see you.

## 2017-08-07 NOTE — Progress Notes (Signed)
To establish care.   Flu shot done at work prev.   Advance directive.  Mother designated if patient were incapacitated.    On iron QOD with prev anemia noted, s/p endoscopy, now off NSAIDS except for 15mg  meloxicam prn, h/o RLS noted.  He has [facility] in the past, [facility].  Due for f/u labs.  See notes on labs.    Hypertension:    Using medication without problems or lightheadedness: he does get lightheaded but it predates HCTZ use.   Chest pain with exertion: no Edema:occ trace sock line.   Short of breath:no  Citalopram used for depression, improved sx later in life.  Mother with cancer dx recently noted.  He is doing well on med.  Failed tx of med prev.   Family history of prostate cancer noted.  We will recheck PSA today.  Discussed with patient about pros and cons of PSA testing.  See notes on labs.   Meds, vitals, and allergies reviewed.   PMH and SH reviewed  ROS: Per HPI unless specifically indicated in ROS section   GEN: nad, alert and oriented HEENT: mucous membranes moist NECK: supple w/o LA CV: rrr. PULM: ctab, no inc wob ABD: soft, +bs EXT: no edema SKIN: no acute rash

## 2017-08-08 DIAGNOSIS — Z7189 Other specified counseling: Secondary | ICD-10-CM | POA: Insufficient documentation

## 2017-08-08 LAB — COMPREHENSIVE METABOLIC PANEL
ALT: 39 U/L (ref 0–53)
AST: 32 U/L (ref 0–37)
Albumin: 4.5 g/dL (ref 3.5–5.2)
Alkaline Phosphatase: 83 U/L (ref 39–117)
BUN: 15 mg/dL (ref 6–23)
CALCIUM: 8.6 mg/dL (ref 8.4–10.5)
CHLORIDE: 99 meq/L (ref 96–112)
CO2: 31 mEq/L (ref 19–32)
Creatinine, Ser: 0.98 mg/dL (ref 0.40–1.50)
GFR: 84.8 mL/min (ref >60.00)
Glucose, Bld: 111 mg/dL — ABNORMAL HIGH (ref 70–99)
POTASSIUM: 3.4 meq/L — AB (ref 3.5–5.1)
Sodium: 140 mEq/L (ref 135–145)
Total Bilirubin: 0.4 mg/dL (ref 0.2–1.2)
Total Protein: 7 g/dL (ref 6.0–8.3)

## 2017-08-08 LAB — LIPID PANEL
Cholesterol: 153 mg/dL (ref 0–200)
HDL: 25.8 mg/dL — ABNORMAL LOW
NonHDL: 127.37
Total CHOL/HDL Ratio: 6
Triglycerides: 368 mg/dL — ABNORMAL HIGH (ref 0.0–149.0)
VLDL: 73.6 mg/dL — ABNORMAL HIGH (ref 0.0–40.0)

## 2017-08-08 LAB — CBC WITH DIFFERENTIAL/PLATELET
Basophils Absolute: 0.1 K/uL (ref 0.0–0.1)
Basophils Relative: 1.1 % (ref 0.0–3.0)
Eosinophils Absolute: 0.2 K/uL (ref 0.0–0.7)
Eosinophils Relative: 3.2 % (ref 0.0–5.0)
HCT: 42.6 % (ref 39.0–52.0)
Hemoglobin: 14.2 g/dL (ref 13.0–17.0)
Lymphocytes Relative: 20.6 % (ref 12.0–46.0)
Lymphs Abs: 1.5 K/uL (ref 0.7–4.0)
MCHC: 33.2 g/dL (ref 30.0–36.0)
MCV: 84.9 fl (ref 78.0–100.0)
Monocytes Absolute: 0.5 K/uL (ref 0.1–1.0)
Monocytes Relative: 6.7 % (ref 3.0–12.0)
Neutro Abs: 5 K/uL (ref 1.4–7.7)
Neutrophils Relative %: 68.4 % (ref 43.0–77.0)
Platelets: 243 K/uL (ref 150.0–400.0)
RBC: 5.02 Mil/uL (ref 4.22–5.81)
RDW: 13.7 % (ref 11.5–15.5)
WBC: 7.4 K/uL (ref 4.0–10.5)

## 2017-08-08 LAB — LDL CHOLESTEROL, DIRECT: LDL DIRECT: 100 mg/dL

## 2017-08-08 LAB — IBC PANEL
Iron: 41 ug/dL — ABNORMAL LOW (ref 42–165)
Saturation Ratios: 8.7 % — ABNORMAL LOW (ref 20.0–50.0)
Transferrin: 335 mg/dL (ref 212.0–360.0)

## 2017-08-08 LAB — PSA: PSA: 0.25 ng/mL (ref 0.10–4.00)

## 2017-08-08 NOTE — Assessment & Plan Note (Signed)
Improved with citalopram use.  No suicidal or homicidal intent.  Okay for outpatient follow-up.  Stressor noted with his mother's cancer dx.

## 2017-08-08 NOTE — Assessment & Plan Note (Signed)
H/o with RLS sx noted.   Recheck labs today.  He may need more frequent iron supplementation if his level is still low.  He may need higher dose of Requip if iron level is normal.  Discussed with patient.  See notes on labs.

## 2017-08-08 NOTE — Assessment & Plan Note (Signed)
Family history of prostate cancer noted.  We will recheck PSA today.  Discussed with patient about pros and cons of PSA testing.  See notes on labs.

## 2017-08-08 NOTE — Assessment & Plan Note (Signed)
Advance directive.  Mother designated if patient were incapacitated.

## 2017-08-08 NOTE — Assessment & Plan Note (Signed)
Likely need lower dose of hydrochlorothiazide.  Change to every other day dosing and see if lightheadedness is better.  Recheck labs in the meantime.  He agrees..  No chest pain. >25 minutes spent in face to face time with patient, >50% spent in counselling or coordination of care.

## 2017-08-13 ENCOUNTER — Other Ambulatory Visit: Payer: Self-pay | Admitting: Family Medicine

## 2017-08-13 DIAGNOSIS — R739 Hyperglycemia, unspecified: Secondary | ICD-10-CM

## 2017-08-13 DIAGNOSIS — E785 Hyperlipidemia, unspecified: Secondary | ICD-10-CM

## 2017-08-13 DIAGNOSIS — E611 Iron deficiency: Secondary | ICD-10-CM

## 2017-08-14 ENCOUNTER — Telehealth: Payer: 59 | Admitting: Family

## 2017-08-14 DIAGNOSIS — J029 Acute pharyngitis, unspecified: Secondary | ICD-10-CM | POA: Diagnosis not present

## 2017-08-14 MED ORDER — FLUTICASONE PROPIONATE 50 MCG/ACT NA SUSP: 2.0000 | Freq: Two times a day (BID) | NASAL | 1 refills | Status: DC

## 2017-08-14 MED ORDER — BENZONATATE 100 MG PO CAPS: 100.0000 mg | ORAL_CAPSULE | Freq: Three times a day (TID) | ORAL | 0 refills | Status: DC | PRN

## 2017-08-14 MED ORDER — PREDNISONE 5 MG PO TABS: 5.0000 mg | ORAL_TABLET | ORAL | 0 refills | Status: DC

## 2017-08-14 MED FILL — FLUTICASONE PROP 50 MCG SPR: 50 | 30 days supply | Qty: 16 | Fill #0

## 2017-08-14 MED FILL — BENZONATATE 100 MG CAPS: 100 | 5 days supply | Qty: 30 | Fill #0

## 2017-08-14 MED FILL — predniSONE 5 MG TABS: 5 | 6 days supply | Qty: 21 | Fill #0

## 2017-08-14 NOTE — Progress Notes (Signed)
Thank you for the details you included in the comment boxes. Those details are very helpful in determining the best course of treatment for you and help [place] to provide the best care. The template will say cough but will also treat your sinuses. This is likely viral given the duration of symptoms. 89% of these infections are now found to be viral rather than bacterial and the primary treatment is to decrease swelling/inflammation and discomfort.   We are sorry that you are not feeling well.  Here is how we plan to help!  Based on your presentation I believe you most likely have A cough due to a virus.  This is called viral bronchitis and is best treated by rest, plenty of fluids and control of the cough.  You may use Ibuprofen or Tylenol as directed to help your symptoms.     In addition you may use A non-prescription cough medication called Mucinex DM: take 2 tablets every 12 hours. and A prescription cough medication called Tessalon Perles 100mg . You may take 1-2 capsules every 8 hours as needed for your cough.  Sterapred 5 mg dosepak  Along with Flonase, 2 sprays per nare, twice daily.   From your responses in the eVisit questionnaire you describe inflammation in the upper respiratory tract which is causing a significant cough.  This is commonly called Bronchitis and has four common causes:    Allergies  Viral Infections  Acid Reflux  Bacterial Infection Allergies, viruses and acid reflux are treated by controlling symptoms or eliminating the cause. An example might be a cough caused by taking certain blood pressure medications. You stop the cough by changing the medication. Another example might be a cough caused by acid reflux. Controlling the reflux helps control the cough.  USE OF BRONCHODILATOR ("RESCUE") INHALERS: There is a risk from using your bronchodilator too frequently.  The risk is that over-reliance on a medication which only relaxes the muscles surrounding the breathing tubes  can reduce the effectiveness of medications prescribed to reduce swelling and congestion of the tubes themselves.  Although you feel brief relief from the bronchodilator inhaler, your asthma may actually be worsening with the tubes becoming more swollen and filled with mucus.  This can delay other crucial treatments, such as oral steroid medications. If you need to use a bronchodilator inhaler daily, several times per day, you should discuss this with your provider.  There are probably better treatments that could be used to keep your asthma under control.     HOME CARE . Only take medications as instructed by your medical team. . Complete the entire course of an antibiotic. . Drink plenty of fluids and get plenty of rest. . Avoid close contacts especially the very young and the elderly . Cover your mouth if you cough or cough into your sleeve. . Always remember to wash your hands . A steam or ultrasonic humidifier can help congestion.   GET HELP RIGHT AWAY IF: . You develop worsening fever. . You become short of breath . You cough up blood. . Your symptoms persist after you have completed your treatment plan MAKE SURE YOU   Understand these instructions.  Will watch your condition.  Will get help right away if you are not doing well or get worse.  Your e-visit answers were reviewed by a board certified advanced clinical practitioner to complete your personal care plan.  Depending on the condition, your plan could have included both over the counter or prescription medications. If  there is a problem please reply  once you have received a response from your provider. Your safety is important to [place].  If you have drug allergies check your prescription carefully.    You can use MyChart to ask questions about today's visit, request a non-urgent call back, or ask for a work or school excuse for 24 hours related to this e-Visit. If it has been greater than 24 hours you will need to follow up  with your provider, or enter a new e-Visit to address those concerns. You will get an e-mail in the next two days asking about your experience.  I hope that your e-visit has been valuable and will speed your recovery. Thank you for using e-visits.

## 2017-09-01 MED FILL — rOPINIRole HCL 0.25 MG TABS: 0.25 | 30 days supply | Qty: 30 | Fill #1

## 2017-10-22 ENCOUNTER — Other Ambulatory Visit: Payer: Self-pay | Admitting: Family Medicine

## 2017-10-22 MED FILL — CITALOPRAM HBR 20 MG TABLET: 20 | 90 days supply | Qty: 90 | Fill #1

## 2017-10-23 ENCOUNTER — Other Ambulatory Visit: Payer: Self-pay | Admitting: *Deleted

## 2017-10-23 MED ORDER — HYDROCHLOROTHIAZIDE 25 MG PO TABS: 25.0000 mg | ORAL_TABLET | ORAL | 1 refills | Status: DC

## 2017-10-23 MED ORDER — OMEPRAZOLE 20 MG PO CPDR: 20.0000 mg | DELAYED_RELEASE_CAPSULE | Freq: Every day | ORAL | 1 refills | Status: DC

## 2017-10-23 MED FILL — OMEPRAZOLE 20 MG CAP: 20 | 90 days supply | Qty: 90 | Fill #0

## 2017-11-12 ENCOUNTER — Other Ambulatory Visit: Payer: 59

## 2017-11-13 ENCOUNTER — Other Ambulatory Visit (INDEPENDENT_AMBULATORY_CARE_PROVIDER_SITE_OTHER): Payer: 59

## 2017-11-13 DIAGNOSIS — E785 Hyperlipidemia, unspecified: Secondary | ICD-10-CM

## 2017-11-13 DIAGNOSIS — R739 Hyperglycemia, unspecified: Secondary | ICD-10-CM | POA: Diagnosis not present

## 2017-11-13 DIAGNOSIS — E611 Iron deficiency: Secondary | ICD-10-CM | POA: Diagnosis not present

## 2017-11-13 LAB — LIPID PANEL
CHOLESTEROL: 141 mg/dL (ref 0–200)
HDL: 28.6 mg/dL — ABNORMAL LOW (ref >39.00)
LDL CALC: 74 mg/dL (ref 0–99)
NonHDL: 112.73
Total CHOL/HDL Ratio: 5
Triglycerides: 195 mg/dL — ABNORMAL HIGH (ref 0.0–149.0)
VLDL: 39 mg/dL (ref 0.0–40.0)

## 2017-11-13 LAB — IRON: IRON: 48 ug/dL (ref 42–165)

## 2017-11-13 LAB — GLUCOSE, RANDOM: GLUCOSE: 115 mg/dL — AB (ref 70–99)

## 2017-12-11 ENCOUNTER — Encounter: Payer: Self-pay | Admitting: Emergency Medicine

## 2017-12-11 ENCOUNTER — Emergency Department
Admission: EM | Admit: 2017-12-11 | Discharge: 2017-12-11 | Disposition: A | Discharge status: Home / Self Care | Payer: 59 | Attending: Emergency Medicine | Admitting: Emergency Medicine

## 2017-12-11 ENCOUNTER — Telehealth: Payer: Self-pay | Admitting: Family Medicine

## 2017-12-11 ENCOUNTER — Other Ambulatory Visit: Payer: Self-pay

## 2017-12-11 DIAGNOSIS — Z79899 Other long term (current) drug therapy: Secondary | ICD-10-CM | POA: Diagnosis not present

## 2017-12-11 DIAGNOSIS — J4520 Mild intermittent asthma, uncomplicated: Secondary | ICD-10-CM | POA: Insufficient documentation

## 2017-12-11 DIAGNOSIS — I10 Essential (primary) hypertension: Secondary | ICD-10-CM | POA: Insufficient documentation

## 2017-12-11 DIAGNOSIS — G510 Bell's palsy: Secondary | ICD-10-CM | POA: Insufficient documentation

## 2017-12-11 DIAGNOSIS — R29810 Facial weakness: Secondary | ICD-10-CM | POA: Diagnosis present

## 2017-12-11 MED ORDER — METHYLPREDNISOLONE 4 MG PO TBPK: ORAL_TABLET | ORAL | 0 refills | Status: DC

## 2017-12-11 NOTE — ED Provider Notes (Signed)
[facility] Emergency Department Provider Note   ____________________________________________   First MD Initiated Contact with Patient 12/11/17 1032     (approximate)  I have reviewed the triage vital signs and the nursing notes.   HISTORY  Chief Complaint Facial Droop    HPI Patrick Oconnor is a 52 y.o. male patient complain left facial drooping for 2 days.  Patient also state decreased ability to close the left eye.  Patient recently had a viral upper respiratory infection which resolved without treatment.  Patient denies pain and no palliative measures for complaint.  Past Medical History:  Diagnosis Date  . Anemia   . Asthma, exercise induced   . Degenerative disc disease, lumbar   . Depression   . Environmental allergies   . GERD (gastroesophageal reflux disease)   . History of bronchitis   . Hypertension   . IBS (irritable bowel syndrome)   . Pneumonia    hx of  . PONV (postoperative nausea and vomiting)   . Sleep apnea 9/08   Started CPAP; does not wear CPAP nightly    Patient Active Problem List   Diagnosis Date Noted  . Advance care planning 08/08/2017  . Elevated LFTs 06/29/2016  . Paresthesia and pain of both upper extremities 06/29/2016  . Hypertriglyceridemia 06/28/2016  . Urinary hesitancy 06/28/2016  . Headache 04/27/2015  . Anemia, iron deficiency 02/28/2015  . Arthritis 02/21/2015  . Orthostatic hypotension 02/21/2015  . Constipation 07/04/2014  . Preventative health care 01/07/2014  . Postoperative wound infection 06/16/2013  . FH: prostate cancer 02/09/2013  . Fatigue 02/09/2013  . Low back pain potentially associated with radiculopathy 06/23/2012  . BENIGN POSITIONAL VERTIGO, HX OF 05/16/2009  . HYPERTENSION, MILD 07/26/2008  . EIC (epidermal inclusion cyst) 12/22/2007  . RESTLESS LEG SYNDROME, MILD 09/29/2007  . Sleep apnea 03/17/2007  . GLUCOSE INTOLERANCE 10/09/2006  . OBESITY, NOS 10/09/2006  .  Depression 10/09/2006  . Allergic rhinitis 10/09/2006  . ASTHMA, INTERMITTENT 10/09/2006  . GASTROESOPHAGEAL REFLUX, NO ESOPHAGITIS 10/09/2006  . IRRITABLE BOWEL SYNDROME 10/09/2006    Past Surgical History:  Procedure Laterality Date  . ACROMIO-CLAVICULAR JOINT REPAIR Right 04/15/2013   Procedure: RIGHT SHOULDER ACROMIO-CLAVICULAR RECONSTRUCTION WITH ALLOGRAFT   ;  Surgeon: Marin Shutter, MD;  Location: [place];  Service: Orthopedics;  Laterality: Right;  . ACROMIO-CLAVICULAR JOINT REPAIR Right 06/03/2013   Procedure: ACROMIO-CLAVICULAR JOINT REPAIR;  Surgeon: Marin Shutter, MD;  Location: [place];  Service: Orthopedics;  Laterality: Right;  . ACROMIO-CLAVICULAR JOINT REPAIR Right 10/19/2015   Procedure: OPEN RIGHT SHOULDER ACROMIO-CLAVICULAR JOINT RECONSTRUCTION ;  Surgeon: Justice Britain, MD;  Location: [place];  Service: Orthopedics;  Laterality: Right;  . COLONOSCOPY  08/12/2005   "multiple"  . ESOPHAGOGASTRODUODENOSCOPY  3/09   normal; multiple  . ESOPHAGOGASTRODUODENOSCOPY (EGD) WITH PROPOFOL N/A 01/22/2016   Procedure: ESOPHAGOGASTRODUODENOSCOPY (EGD) WITH PROPOFOL;  Surgeon: Laurence Spates, MD;  Location: [facility];  Service: Endoscopy;  Laterality: N/A;  . INCISION AND DRAINAGE Right 06/03/2013   Procedure: INCISION AND DRAINAGE RIGHT SHOULDER;  Surgeon: Marin Shutter, MD;  Location: [place];  Service: Orthopedics;  Laterality: Right;  . pilonidal cystectomy  42003   x 2  . PYLOROPLASTY  infant   pyloric stenosis  . SEPTOPLASTY    . SHOULDER OPEN ROTATOR CUFF REPAIR Right 10/19/2015   Procedure: OPEN RIGHT SHOULDER ROTATOR CUFF REPAIR ;  Surgeon: Justice Britain, MD;  Location: [place];  Service: Orthopedics;  Laterality: Right;  . thrombosed  external hemorrhoid  9/08   incised  . TONSILLECTOMY AND ADENOIDECTOMY      Prior to Admission medications   Medication Sig Start Date End Date Taking? Authorizing Provider  albuterol (PROVENTIL,VENTOLIN) 90 MCG/ACT inhaler Inhale 2 puffs into the  lungs every 6 (six) hours as needed for shortness of breath.     [provider]  benzonatate (TESSALON PERLES) 100 MG capsule Take 1-2 capsules (100-200 mg total) by mouth every 8 (eight) hours as needed for cough. 08/14/17   Withrow, Elyse Jarvis, FNP  citalopram (CELEXA) 20 MG tablet Take 1 tablet (20 mg total) by mouth daily. 07/25/17   Alveda Reasons, MD  ferrous sulfate 325 (65 FE) MG tablet Take 1 tablet (325 mg total) by mouth every other day. 08/07/17   Tonia Ghent, MD  fluticasone Asencion Islam) 50 MCG/ACT nasal spray Place 2 sprays into both nostrils daily as needed for allergies. 08/07/17   Tonia Ghent, MD  fluticasone (FLONASE) 50 MCG/ACT nasal spray Place 2 sprays into both nostrils 2 (two) times daily. For 7 days then once daily 08/14/17   Withrow, Elyse Jarvis, FNP  hydrochlorothiazide (HYDRODIURIL) 25 MG tablet Take 1 tablet (25 mg total) by mouth every other day. 10/23/17   Tonia Ghent, MD  loratadine (CLARITIN) 10 MG tablet Take 10 mg by mouth daily.     [provider]  meloxicam (MOBIC) 15 MG tablet Take 1 tablet (15 mg total) by mouth daily as needed for pain. 08/07/17   Tonia Ghent, MD  methylPREDNISolone (MEDROL DOSEPAK) 4 MG TBPK tablet Take Tapered dose as directed 12/11/17   Sable Feil, PA-C  omeprazole (PRILOSEC) 20 MG capsule Take 1 capsule (20 mg total) by mouth daily. 10/23/17   Tonia Ghent, MD  polyethylene glycol powder (GLYCOLAX/MIRALAX) powder Take 17 g by mouth 2 (two) times daily as needed (For constipation.). 08/07/17   Tonia Ghent, MD  predniSONE (DELTASONE) 5 MG tablet Take 1 tablet (5 mg total) by mouth as directed. sterapred generic taper 08/14/17   Withrow, Elyse Jarvis, FNP  Probiotic Product (PROBIOTIC DAILY PO) Take 1 capsule by mouth daily as needed (For digestive health.).     [provider]  rOPINIRole (REQUIP) 0.25 MG tablet TAKE 1 TABLET (0.25 MG TOTAL) BY MOUTH AT BEDTIME. 07/23/17   Alveda Reasons, MD    sennosides-docusate sodium (SENOKOT-S) 8.6-50 MG tablet Take 1-2 tablets by mouth daily as needed for constipation. 08/07/17   Tonia Ghent, MD  Tetrahydroz-Dextran-PEG-Povid Karma Lew ADVANCED RELIEF OP) Apply 1-2 drops to eye as needed (For eye irritation.).     [provider]    Allergies Patient has no known allergies.  Family History  Problem Relation Age of Onset  . Cancer Father 29       prostate Stage 4  . Prostate cancer Father   . Cancer Maternal Grandmother        breast  . Cancer Mother   . Cancer Paternal Grandmother        Colon    Social History Social History   Tobacco Use  . Smoking status: Never Smoker  . Smokeless tobacco: Never Used  Substance Use Topics  . Alcohol use: Yes    Alcohol/week: 0.0 oz    Comment: rarely  . Drug use: No    Review of Systems Constitutional: No fever/chills Eyes: No visual changes. ENT: No sore throat. Cardiovascular: Denies chest pain. Respiratory: Denies shortness of breath. Gastrointestinal: No abdominal  pain.  No nausea, no vomiting.  No diarrhea.  No constipation. Genitourinary: Negative for dysuria. Musculoskeletal: Negative for back pain. Skin: Negative for rash. Neurological: Negative for headaches, but focal weakness and numbness to the left facial area.  Psychiatric:Depression Endocrine:Hypertension   ____________________________________________   PHYSICAL EXAM:  VITAL SIGNS: ED Triage Vitals  Enc Vitals Group     BP 12/11/17 1004 (!) 159/95     Pulse Rate 12/11/17 1004 72     Resp 12/11/17 1004 18     Temp 12/11/17 1004 99.5 F (37.5 C)     Temp Source 12/11/17 1004 Oral     SpO2 12/11/17 1004 97 %     Weight 12/11/17 1004 255 lb (115.7 kg)     Height 12/11/17 1004 6\' 2"  (1.88 m)     Head Circumference --      Peak Flow --      Pain Score 12/11/17 1011 2     Pain Loc --      Pain Edu? --      Excl. in [place]? --    Constitutional: Alert and oriented. Well appearing and in no  acute distress. Eyes: Conjunctivae are normal. PERRL. EOMI. decreased closing of left eyelid. Cardiovascular: Normal rate, regular rhythm. Grossly normal heart sounds.  Good peripheral circulation. Respiratory: Normal respiratory effort.  No retractions. Lungs CTAB. Neurologic:  Normal speech and language.  Left branch of the cranial nerve 7 deficits are appreciated.  Skin:  Skin is warm, dry and intact. No rash noted. Psychiatric: Mood and affect are normal. Speech and behavior are normal.  ____________________________________________   LABS (all labs ordered are listed, but only abnormal results are displayed)  Labs Reviewed - No data to display ____________________________________________  EKG   ____________________________________________  RADIOLOGY  ED MD interpretation:    Official radiology report(s): No results found.  ____________________________________________   PROCEDURES  Procedure(s) performed: None  Procedures  Critical Care performed: No  ____________________________________________   INITIAL IMPRESSION / ASSESSMENT AND PLAN / ED COURSE  As part of my medical decision making, I reviewed the following data within the [place]    Patient presents with left facial numbness.  Patient physical exam consistent with Bell's palsy.  Patient given discharge care instruction advised take medication as directed.  Patient given a work note for 2 days.  Patient advised follow-up PCP in 1 week if no improvement.      ____________________________________________   FINAL CLINICAL IMPRESSION(S) / ED DIAGNOSES  Final diagnoses:  Bell's palsy     ED Discharge Orders        Ordered    methylPREDNISolone (MEDROL DOSEPAK) 4 MG TBPK tablet     12/11/17 1042       Note:  This document was prepared using Dragon voice recognition software and may include unintentional dictation errors.    Sable Feil, PA-C 12/11/17 1049      Arta Silence, MD 12/11/17 1530

## 2017-12-11 NOTE — Telephone Encounter (Signed)
Was seen earlier today.   Please schedule f/u when possible.  Thanks.

## 2017-12-11 NOTE — ED Notes (Signed)
First Nurse Note:  Patient states he thinks he has Bells Palsy, placed in Triage room 3.

## 2017-12-11 NOTE — ED Notes (Signed)
See triage note  Presents with facial drooping and unable to close right eye  Sx's started last pm  But has also had sinus pressure for the past couple of days prior

## 2017-12-11 NOTE — Telephone Encounter (Signed)
FYI to Dr. Duncan 

## 2017-12-11 NOTE — Telephone Encounter (Signed)
Forwarding to Dr. Damita Dunnings as update from previous phone encounter.

## 2017-12-11 NOTE — Telephone Encounter (Signed)
Pt called back to report [facility] sent him to ED. Calling to request appointment. Stated now with difficulty "manipulating tongue, mouth."  Speech clear. Reiterated need to be seen in ED. Pt states he will go now.

## 2017-12-11 NOTE — Telephone Encounter (Signed)
Patient called stating he had symptoms of some - motor sensation loss l  Side of face with onset last night with slight symptoms   Patient states this am he noticed it more  when he was brushing his teeth. He is speaking clearly. He actually is walking in the door at [facility] to be seen at this time . He is speaking in complete sentences he [facility] himself to the [facility] . 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 Patient was advised to follow the plan of care  of the provider at [place] and to call  And schedule a followup accordingly.

## 2017-12-11 NOTE — Telephone Encounter (Signed)
Left detailed message on voicemail.  

## 2017-12-11 NOTE — ED Triage Notes (Signed)
Patient has facial droop on left side including mouth and eye.  No other symptoms. Says he thinks it may have started acouple days ago, but really noticed it last night while eating dinner about 8pm.

## 2018-01-16 DIAGNOSIS — H6123 Impacted cerumen, bilateral: Secondary | ICD-10-CM | POA: Diagnosis not present

## 2018-01-16 DIAGNOSIS — J300 Vasomotor rhinitis: Secondary | ICD-10-CM | POA: Diagnosis not present

## 2018-01-21 MED FILL — FLUTICASONE PROP 50 MCG SPR: 50 | 30 days supply | Qty: 16 | Fill #0

## 2018-01-22 ENCOUNTER — Other Ambulatory Visit: Payer: Self-pay | Admitting: Family Medicine

## 2018-01-22 MED FILL — CITALOPRAM HBR 20 MG TABLET: 20 | 90 days supply | Qty: 90 | Fill #2

## 2018-01-22 MED FILL — OMEPRAZOLE 20 MG CAP: 20 | 90 days supply | Qty: 90 | Fill #1

## 2018-01-27 ENCOUNTER — Other Ambulatory Visit: Payer: Self-pay | Admitting: Family Medicine

## 2018-02-11 ENCOUNTER — Telehealth: Payer: Self-pay | Admitting: *Deleted

## 2018-02-11 MED FILL — rOPINIRole HCL 0.25 MG TABS: 0.25 | 30 days supply | Qty: 30 | Fill #2

## 2018-02-11 NOTE — Telephone Encounter (Signed)
Received a faxed refill request for Hydrochlorothiazide take one by mouth daily. Medication list has different directions Last office visit 08/07/17 ( see office notes)

## 2018-02-13 MED ORDER — HYDROCHLOROTHIAZIDE 25 MG PO TABS: 25.0000 mg | ORAL_TABLET | ORAL | 1 refills | Status: DC

## 2018-02-13 MED FILL — HYDROCHLOROTHIAZIDE 25 MG T: 25 | 90 days supply | Qty: 45 | Fill #0

## 2018-02-13 NOTE — Telephone Encounter (Addendum)
Patient states he has been taking the HCTZ every other day and questions if he even needs it at all but will continue until he can schedule an appointment to come in to see Dr. Damita Dunnings. Rx sent.

## 2018-02-13 NOTE — Telephone Encounter (Signed)
Please verify dosing/lightheadedness/BP with patient and let me know.  Thanks.

## 2018-02-13 NOTE — Telephone Encounter (Signed)
Left detailed message on voicemail for return call with information. 

## 2018-02-24 ENCOUNTER — Ambulatory Visit: Payer: Self-pay | Admitting: Physician Assistant

## 2018-02-24 VITALS — BP 138/90 | HR 71 | Temp 97.7°F | Wt 260.0 lb

## 2018-02-24 DIAGNOSIS — J069 Acute upper respiratory infection, unspecified: Secondary | ICD-10-CM

## 2018-02-24 MED ORDER — AZITHROMYCIN 250 MG PO TABS: ORAL_TABLET | ORAL | 0 refills | Status: AC

## 2018-02-24 MED FILL — AZITHROMYCIN 250 MG TABLET: 250 | 5 days supply | Qty: 6 | Fill #0

## 2018-02-24 NOTE — Progress Notes (Signed)
02/24/2018 10:01 AM   DOB: October 29, 1965 / MRN: [id number]  SUBJECTIVE:  Patrick Oconnor is a 52 y.o. male presenting for cough with chest congestion, nasal congestion and sore throat. Symptoms present for 3 days.  The problem is worsening. He has tried OTC meds with mild relief. No chest pain, SOB, DOE, orthopnea, leg swelling.   He has No Known Allergies.   He  has a past medical history of Anemia, Asthma, exercise induced, Degenerative disc disease, lumbar, Depression, Environmental allergies, GERD (gastroesophageal reflux disease), History of bronchitis, Hypertension, IBS (irritable bowel syndrome), Pneumonia, PONV (postoperative nausea and vomiting), and Sleep apnea (9/08).    He  reports that he has never smoked. He has never used smokeless tobacco. He reports that he drinks about 0.2 oz of alcohol per week. He reports that he does not use drugs. He  reports that he does not currently engage in sexual activity. The patient  has a past surgical history that includes Pyloroplasty (infant); Tonsillectomy and adenoidectomy; pilonidal cystectomy (29518); thrombosed external hemorrhoid (9/08); Esophagogastroduodenoscopy (3/09); Colonoscopy (08/12/2005); Acromio-clavicular joint repair (Right, 04/15/2013); Septoplasty; Incision and drainage (Right, 06/03/2013); Acromio-clavicular joint repair (Right, 06/03/2013); Shoulder open rotator cuff repair (Right, 10/19/2015); Acromio-clavicular joint repair (Right, 10/19/2015); and Esophagogastroduodenoscopy (egd) with propofol (N/A, 01/22/2016).  His family history includes Cancer in his maternal grandmother, mother, and paternal grandmother; Cancer (age of onset: 66) in his father; Prostate cancer in his father.  Review of Systems  Constitutional: Negative for chills, diaphoresis and fever.  HENT: Positive for congestion, sinus pain and sore throat.   Eyes: Negative.   Respiratory: Positive for cough. Negative for hemoptysis, sputum production, shortness of  breath and wheezing.   Cardiovascular: Negative for chest pain, orthopnea and leg swelling.  Gastrointestinal: Negative for nausea.  Skin: Negative for rash.  Neurological: Negative for dizziness.    The problem list and medications were reviewed and updated by myself where necessary and exist elsewhere in the encounter.   OBJECTIVE:  BP 138/90   Pulse 71   Temp 97.7 F (36.5 C)   Wt 260 lb (117.9 kg)   SpO2 98%   BMI 33.38 kg/m   Wt Readings from Last 3 Encounters:  02/24/18 260 lb (117.9 kg)  12/11/17 255 lb (115.7 kg)  08/07/17 265 lb (120.2 kg)   Temp Readings from Last 3 Encounters:  02/24/18 97.7 F (36.5 C)  12/11/17 99.5 F (37.5 C) (Oral)  08/07/17 98.3 F (36.8 C) (Oral)   BP Readings from Last 3 Encounters:  02/24/18 138/90  12/11/17 (!) 159/95  08/07/17 124/74   Pulse Readings from Last 3 Encounters:  02/24/18 71  12/11/17 72  08/07/17 76    Physical Exam  Constitutional: He is oriented to person, place, and time. He appears well-developed. He is active.  Non-toxic appearance. He does not appear ill.  HENT:  Right Ear: Hearing, tympanic membrane, external ear and ear canal normal.  Left Ear: Hearing, tympanic membrane, external ear and ear canal normal.  Nose: Nose normal. Right sinus exhibits no maxillary sinus tenderness and no frontal sinus tenderness. Left sinus exhibits no maxillary sinus tenderness and no frontal sinus tenderness.  Mouth/Throat: Uvula is midline, oropharynx is clear and moist and mucous membranes are normal. No oropharyngeal exudate, posterior oropharyngeal edema or tonsillar abscesses.  Eyes: Pupils are equal, round, and reactive to light. Conjunctivae and EOM are normal.  Cardiovascular: Normal rate, regular rhythm, S1 normal, S2 normal, normal heart sounds, intact distal pulses and normal  pulses. Exam reveals no gallop and no friction rub.  No murmur heard. Pulmonary/Chest: Effort normal. No stridor. No respiratory distress.  He has no wheezes. He has no rales.  Abdominal: He exhibits no distension.  Musculoskeletal: Normal range of motion. He exhibits no edema.  Lymphadenopathy:       Head (right side): No submandibular and no tonsillar adenopathy present.       Head (left side): No submandibular and no tonsillar adenopathy present.    He has no cervical adenopathy.  Neurological: He is alert and oriented to person, place, and time. No cranial nerve deficit. Coordination normal.  Skin: Skin is warm and dry. He is not diaphoretic. No pallor.  Psychiatric: He has a normal mood and affect.  Nursing note and vitals reviewed.   No results found for: HGBA1C  Lab Results  Component Value Date   WBC 7.4 08/07/2017   HGB 14.2 08/07/2017   HCT 42.6 08/07/2017   MCV 84.9 08/07/2017   PLT 243.0 08/07/2017    Lab Results  Component Value Date   CREATININE 0.98 08/07/2017   BUN 15 08/07/2017   NA 140 08/07/2017   K 3.4 (L) 08/07/2017   CL 99 08/07/2017   CO2 31 08/07/2017    Lab Results  Component Value Date   ALT 39 08/07/2017   AST 32 08/07/2017   ALKPHOS 83 08/07/2017   BILITOT 0.4 08/07/2017    Lab Results  Component Value Date   TSH 1.115 02/21/2015    Lab Results  Component Value Date   CHOL 141 11/13/2017   HDL 28.60 (L) 11/13/2017   LDLCALC 74 11/13/2017   LDLDIRECT 100.0 08/07/2017   TRIG 195.0 (H) 11/13/2017   CHOLHDL 5 11/13/2017     ASSESSMENT AND PLAN:  Patrick Oconnor was seen today for save-cough/mucus and fever.  Diagnoses and all orders for this visit:  URI with cough and congestion Comments: Likely viral however back up plan with azithromycin per avs.   Other orders -     azithromycin (ZITHROMAX) 250 MG tablet; Take two tabs on day on and one daily thereafter.    The patient is advised to call or return to clinic if he does not see an improvement in symptoms, or to seek the care of the closest emergency department if he worsens with the above plan.   Patrick Oconnor,  MHS, PA-C [facility] 02/24/2018 10:01 AM

## 2018-02-24 NOTE — Patient Instructions (Signed)
Try to hold the antibiotic in favor of zyrtec-D and 600 mg of ibuprofen every 8 hours.  Reasons to fill the antibiotic include worsening symptoms at day 7, or symptoms going past day 10.  Fever greater than 100.4 is also a good reason to start the antibiotic.

## 2018-02-27 ENCOUNTER — Telehealth: Payer: Self-pay | Admitting: Emergency Medicine

## 2018-02-27 NOTE — Telephone Encounter (Signed)
Left message follow up call from [facility] visit 

## 2018-05-07 ENCOUNTER — Other Ambulatory Visit: Payer: Self-pay | Admitting: Family Medicine

## 2018-05-07 MED FILL — CITALOPRAM HBR 20 MG TABLET: 20 | 90 days supply | Qty: 90 | Fill #3

## 2018-05-07 MED FILL — HYDROCHLOROTHIAZIDE 25 MG T: 25 | 90 days supply | Qty: 45 | Fill #1

## 2018-05-07 MED FILL — OMEPRAZOLE 20 MG CPDR: 20 | 90 days supply | Qty: 90 | Fill #0

## 2018-07-21 DIAGNOSIS — H5201 Hypermetropia, right eye: Secondary | ICD-10-CM | POA: Diagnosis not present

## 2018-07-21 DIAGNOSIS — H5212 Myopia, left eye: Secondary | ICD-10-CM | POA: Diagnosis not present

## 2018-07-21 DIAGNOSIS — H52223 Regular astigmatism, bilateral: Secondary | ICD-10-CM | POA: Diagnosis not present

## 2018-07-28 ENCOUNTER — Ambulatory Visit (INDEPENDENT_AMBULATORY_CARE_PROVIDER_SITE_OTHER): Payer: 59 | Admitting: Family Medicine

## 2018-07-28 ENCOUNTER — Encounter

## 2018-07-28 ENCOUNTER — Encounter: Payer: Self-pay | Admitting: Family Medicine

## 2018-07-28 VITALS — BP 106/76 | HR 75 | Temp 98.5°F | Ht 74.0 in | Wt 252.0 lb

## 2018-07-28 DIAGNOSIS — R42 Dizziness and giddiness: Secondary | ICD-10-CM | POA: Diagnosis not present

## 2018-07-28 DIAGNOSIS — Z125 Encounter for screening for malignant neoplasm of prostate: Secondary | ICD-10-CM

## 2018-07-28 DIAGNOSIS — G2581 Restless legs syndrome: Secondary | ICD-10-CM

## 2018-07-28 DIAGNOSIS — H93A9 Pulsatile tinnitus, unspecified ear: Secondary | ICD-10-CM

## 2018-07-28 DIAGNOSIS — I10 Essential (primary) hypertension: Secondary | ICD-10-CM

## 2018-07-28 MED ORDER — ROPINIROLE HCL 0.25 MG PO TABS: 0.2500 mg | ORAL_TABLET | Freq: Every day | ORAL | 3 refills | Status: DC

## 2018-07-28 MED FILL — rOPINIRole HCL 0.25 MG TABS: 0.25 | 90 days supply | Qty: 90 | Fill #0

## 2018-07-28 NOTE — Patient Instructions (Addendum)
We'll contact you with your lab report. Stop HCTZ for now.  Let me know if that helps at all.   We'll go from there.  Take care.  Glad to see you.

## 2018-07-28 NOTE — Progress Notes (Signed)
Needed refill on requip.  That and iron help with RLS sx.  D/w pt. no adverse effect on medication.  He recovered from Bell's palsy.  He still has pulsatile tinnitus and has audiology f/u pending.  I will defer.  He agrees.  Everyday having hot flashes.  Happens from the ankles to his head.  Lasts about 10 minutes.  Going on for about 1 year, but more frequent now.  Fatigue noted.  He is getting lightheaded occ with standing, but not everytime.  No syncope.  No CP, SOB.  Some mild BLE edema.  Some occ BLE itching, on the shins, at the sockline or distally.    He has vertigo sx that are separate from lightheadedness. He has been on HCTZ.    His mother is going through treatment for cancer.  Discussed.  Family history of prostate cancer noted.  Discussed PSA screening.  See notes on labs.  We talked about getting all of his labs for the issues today and for his routine physical.  Meds, vitals, and allergies reviewed.   ROS: Per HPI unless specifically indicated in ROS section   GEN: nad, alert and oriented HEENT: mucous membranes moist NECK: supple w/o LA CV: rrr.  no murmur PULM: ctab, no inc wob ABD: soft, +bs EXT: no edema SKIN: no acute rash Not tachy on moving to standing.

## 2018-07-29 DIAGNOSIS — R42 Dizziness and giddiness: Secondary | ICD-10-CM | POA: Insufficient documentation

## 2018-07-29 DIAGNOSIS — H93A9 Pulsatile tinnitus, unspecified ear: Secondary | ICD-10-CM | POA: Insufficient documentation

## 2018-07-29 LAB — LIPID PANEL
Cholesterol: 151 mg/dL (ref 0–200)
HDL: 32.4 mg/dL — ABNORMAL LOW (ref >39.00)
LDL Cholesterol: 89 mg/dL (ref 0–99)
NonHDL: 118.76
Total CHOL/HDL Ratio: 5
Triglycerides: 147 mg/dL (ref 0.0–149.0)
VLDL: 29.4 mg/dL (ref 0.0–40.0)

## 2018-07-29 LAB — TSH: TSH: 1.55 u[IU]/mL (ref 0.35–4.50)

## 2018-07-29 LAB — CBC WITH DIFFERENTIAL/PLATELET
Basophils Absolute: 0.1 10*3/uL (ref 0.0–0.1)
Basophils Relative: 0.7 % (ref 0.0–3.0)
Eosinophils Absolute: 0.5 10*3/uL (ref 0.0–0.7)
Eosinophils Relative: 6.7 % — ABNORMAL HIGH (ref 0.0–5.0)
HCT: 43.4 % (ref 39.0–52.0)
HEMOGLOBIN: 14.4 g/dL (ref 13.0–17.0)
LYMPHS PCT: 18.3 % (ref 12.0–46.0)
Lymphs Abs: 1.4 10*3/uL (ref 0.7–4.0)
MCHC: 33.3 g/dL (ref 30.0–36.0)
MCV: 83.9 fl (ref 78.0–100.0)
Monocytes Absolute: 0.6 10*3/uL (ref 0.1–1.0)
Monocytes Relative: 7.3 % (ref 3.0–12.0)
Neutro Abs: 5.3 10*3/uL (ref 1.4–7.7)
Neutrophils Relative %: 67 % (ref 43.0–77.0)
Platelets: 235 10*3/uL (ref 150.0–400.0)
RBC: 5.17 Mil/uL (ref 4.22–5.81)
RDW: 13.9 % (ref 11.5–15.5)
WBC: 7.9 10*3/uL (ref 4.0–10.5)

## 2018-07-29 LAB — IBC PANEL
Iron: 45 ug/dL (ref 42–165)
Saturation Ratios: 10 % — ABNORMAL LOW (ref 20.0–50.0)
Transferrin: 320 mg/dL (ref 212.0–360.0)

## 2018-07-29 LAB — COMPREHENSIVE METABOLIC PANEL
ALBUMIN: 4.8 g/dL (ref 3.5–5.2)
ALT: 26 U/L (ref 0–53)
AST: 24 U/L (ref 0–37)
Alkaline Phosphatase: 91 U/L (ref 39–117)
BILIRUBIN TOTAL: 0.5 mg/dL (ref 0.2–1.2)
BUN: 17 mg/dL (ref 6–23)
CO2: 31 mEq/L (ref 19–32)
Calcium: 9.2 mg/dL (ref 8.4–10.5)
Chloride: 101 mEq/L (ref 96–112)
Creatinine, Ser: 1.01 mg/dL (ref 0.40–1.50)
GFR: 81.6 mL/min (ref >60.00)
Glucose, Bld: 92 mg/dL (ref 70–99)
Potassium: 3.9 mEq/L (ref 3.5–5.1)
Sodium: 141 mEq/L (ref 135–145)
Total Protein: 7.5 g/dL (ref 6.0–8.3)

## 2018-07-29 LAB — PSA: PSA: 0.32 ng/mL (ref 0.10–4.00)

## 2018-07-29 NOTE — Assessment & Plan Note (Signed)
He has follow-up with audiology pending and I will defer.  He agrees.

## 2018-07-29 NOTE — Assessment & Plan Note (Signed)
He is also having symptoms of hot flashes episodically.  Unclear source.  Differential diagnosis discussed with patient.  Reasonable to check basic metabolic panel today along with iron panel and CBC, TSH, etc.  See notes on labs.  Unclear how much of this is related to hydrochlorothiazide use.  I would try to only change one thing at a time.  Reasonable to stop hydrochlorothiazide for now and have him update me with his symptoms.  We can go from there.  He agrees with plan.  He has not passed out and is still okay for outpatient follow-up. >25 minutes spent in face to face time with patient, >50% spent in counselling or coordination of care.

## 2018-07-29 NOTE — Assessment & Plan Note (Signed)
Continue Requip and iron for now.  See notes on labs.  He agrees.

## 2018-07-31 DIAGNOSIS — H6993 Unspecified Eustachian tube disorder, bilateral: Secondary | ICD-10-CM | POA: Diagnosis not present

## 2018-07-31 DIAGNOSIS — H9312 Tinnitus, left ear: Secondary | ICD-10-CM | POA: Diagnosis not present

## 2018-07-31 DIAGNOSIS — H9313 Tinnitus, bilateral: Secondary | ICD-10-CM | POA: Diagnosis not present

## 2018-08-04 ENCOUNTER — Telehealth: Payer: Self-pay | Admitting: Family Medicine

## 2018-08-04 MED ORDER — LANSOPRAZOLE 15 MG PO CPDR: 15.0000 mg | DELAYED_RELEASE_CAPSULE | Freq: Every day | ORAL | 3 refills | Status: DC

## 2018-08-04 NOTE — Telephone Encounter (Signed)
Call pt.  I did some checking on this.  There is a potential interaction with prilosec and citalopram.  I wouldn't change citalopram but it would be reasonable to substitute prevacid for prilosec.  It would still be 1 tab a day.  I sent the new rx.  Update me as needed but it shouldn't be an issue on the new medicine.  Thanks.

## 2018-08-04 NOTE — Telephone Encounter (Signed)
Electronic refill request Omeprazole Last office visit 07/28/18 Last refill 05/07/18 #90 See drug warning with Citalopram

## 2018-08-04 NOTE — Telephone Encounter (Signed)
Left message on voicemail for patient to call back. 

## 2018-08-06 NOTE — Telephone Encounter (Signed)
Left detailed message on voicemail as instructed.

## 2018-08-06 NOTE — Telephone Encounter (Signed)
Pt returning call and stated to leave a message

## 2018-08-13 NOTE — Telephone Encounter (Signed)
Spoke to pt who states he was not advised that his medication would be changed, prior to doing so. I informed him that there was an interaction between the prilosec and citalopram. Pt states that the prevacid is appx $18-20 more expensive and he is wanting to know if there is a cheaper option. pls advise

## 2018-08-14 ENCOUNTER — Other Ambulatory Visit: Payer: Self-pay

## 2018-08-14 MED ORDER — PANTOPRAZOLE SODIUM 20 MG PO TBEC: 20.0000 mg | DELAYED_RELEASE_TABLET | Freq: Every day | ORAL | 3 refills | Status: DC

## 2018-08-14 MED FILL — PANTOPRAZOLE SOD DR 20 MG T: 20 | 90 days supply | Qty: 90 | Fill #0

## 2018-08-14 NOTE — Telephone Encounter (Signed)
Please have him price check Protonix.  I sent that prescription.  Thanks.

## 2018-08-14 NOTE — Telephone Encounter (Signed)
Left detailed message on voicemail.  

## 2018-08-14 NOTE — Telephone Encounter (Signed)
Pt request refill citalopram 20 mg; previously filled by Dr Esmond Camper # [phone] x 3. Pt does not remember who Dr Mingo Amber is and pt wants citalopram refilled by Dr Damita Dunnings. Pt last seen 07/28/18 for dizziness and hot flashes. Pt said he takes citalopram for mood changes and depression. [facility] outpt pharmacy.

## 2018-08-14 NOTE — Addendum Note (Signed)
Addended by: Tonia Ghent on: 08/14/2018 06:00 AM   Modules accepted: Orders

## 2018-08-16 MED ORDER — CITALOPRAM HYDROBROMIDE 20 MG PO TABS: 20.0000 mg | ORAL_TABLET | Freq: Every day | ORAL | 3 refills | Status: DC

## 2018-08-16 NOTE — Telephone Encounter (Signed)
Sent. Thanks.   

## 2018-08-17 MED FILL — CITALOPRAM HBR 20 MG TABLET: 20 | 90 days supply | Qty: 90 | Fill #0

## 2018-08-20 ENCOUNTER — Other Ambulatory Visit: Payer: 59

## 2018-08-25 ENCOUNTER — Encounter

## 2018-08-25 ENCOUNTER — Ambulatory Visit (INDEPENDENT_AMBULATORY_CARE_PROVIDER_SITE_OTHER): Payer: 59 | Admitting: Family Medicine

## 2018-08-25 ENCOUNTER — Encounter: Payer: Self-pay | Admitting: Family Medicine

## 2018-08-25 VITALS — BP 116/70 | HR 64 | Temp 97.9°F | Ht 74.0 in | Wt 257.8 lb

## 2018-08-25 DIAGNOSIS — G4730 Sleep apnea, unspecified: Secondary | ICD-10-CM

## 2018-08-25 DIAGNOSIS — G4733 Obstructive sleep apnea (adult) (pediatric): Secondary | ICD-10-CM

## 2018-08-25 DIAGNOSIS — Z7189 Other specified counseling: Secondary | ICD-10-CM

## 2018-08-25 DIAGNOSIS — G2581 Restless legs syndrome: Secondary | ICD-10-CM

## 2018-08-25 DIAGNOSIS — D500 Iron deficiency anemia secondary to blood loss (chronic): Secondary | ICD-10-CM

## 2018-08-25 DIAGNOSIS — Z0000 Encounter for general adult medical examination without abnormal findings: Secondary | ICD-10-CM | POA: Diagnosis not present

## 2018-08-25 DIAGNOSIS — J309 Allergic rhinitis, unspecified: Secondary | ICD-10-CM

## 2018-08-25 DIAGNOSIS — F32A Depression, unspecified: Secondary | ICD-10-CM

## 2018-08-25 DIAGNOSIS — F329 Major depressive disorder, single episode, unspecified: Secondary | ICD-10-CM

## 2018-08-25 DIAGNOSIS — R42 Dizziness and giddiness: Secondary | ICD-10-CM

## 2018-08-25 MED ORDER — FLUTICASONE PROPIONATE 50 MCG/ACT NA SUSP: 2.0000 | Freq: Every day | NASAL | 3 refills | Status: DC | PRN

## 2018-08-25 NOTE — Progress Notes (Signed)
CPE- See plan.  Routine anticipatory guidance given to patient.  See health maintenance.  The possibility exists that previously documented standard health maintenance information may have been brought forward from a previous encounter into this note.  If needed, that same information has been updated to reflect the current situation based on today's encounter.    Tetanus 2013 Flu done at work PNA not due Shingles out of stock.   Colonoscopy 2016 PSA d/w pt.   Diet and exercise d/w pt.    Advance directive d/w pt.  Mother designated if patient were incapacitated.  if mother unavailable, then brother designated.    Depression, on citalopram.  With relief.  No ADE on med.  No SI/HI.    Still using flonase at baseline.  No ADE on med.  Used for pulsatile tinnitus in the meantime.   His mother has esophageal cancer, d/w pt. he is trying to help her.  Stressors related to this noted and discussed.  RLS.  Doing well with current meds.  Taking requip at night and with relief.  No ADE on med.   H/o anemia.  Still on iron and PPI.  Labs d/w pt.  Still on iron. He occ gets lightheaded.    Still tired.  He was asking about OSA eval/tx.  He has CPAP but had trouble with CPAP mask fit.  D/w pt about pulmonary referral.   PMH and SH reviewed  Meds, vitals, and allergies reviewed.   ROS: Per HPI.  Unless specifically indicated otherwise in HPI, the patient denies:  General: fever. Eyes: acute vision changes ENT: sore throat Cardiovascular: chest pain Respiratory: SOB GI: vomiting GU: dysuria Musculoskeletal: acute back pain Derm: acute rash Neuro: acute motor dysfunction Psych: worsening mood Endocrine: polydipsia Heme: bleeding Allergy: hayfever  GEN: nad, alert and oriented HEENT: mucous membranes moist NECK: supple w/o LA CV: rrr. PULM: ctab, no inc wob ABD: soft, +bs EXT: no edema SKIN: no acute rash

## 2018-08-25 NOTE — Patient Instructions (Addendum)
Check with your insurance to see if they will cover the shingrix shot.  We make arrangements for referrals, extra imaging, and other appointments based on the urgency of the situation. Referrals are handled based on the clinical situation, not in the order that they are placed. If you do not see one of our referral coordinators on the way out of the clinic today, then you should expect a call in the next 1 to 2 weeks. We work diligently to process all referrals as quickly as possible.    Take care.  Glad to see you.  Thanks for your effort.   Don't change your meds for now.

## 2018-08-26 NOTE — Assessment & Plan Note (Signed)
Depression, on citalopram.  With relief.  No ADE on med.  No SI/HI.   Continue as is.  He agrees.

## 2018-08-26 NOTE — Assessment & Plan Note (Signed)
H/o anemia.  Still on iron and PPI.  Labs d/w pt.  Still on iron.  Continue as is.  Labs discussed with patient.

## 2018-08-26 NOTE — Assessment & Plan Note (Signed)
Advance directive d/w pt.  Mother designated if patient were incapacitated.  if mother unavailable, then brother designated.

## 2018-08-26 NOTE — Assessment & Plan Note (Signed)
Tetanus 2013 Flu done at work PNA not due Shingles out of stock.   Colonoscopy 2016 PSA d/w pt.   Diet and exercise d/w pt.    Advance directive d/w pt.  Mother designated if patient were incapacitated.  if mother unavailable, then brother designated.

## 2018-08-26 NOTE — Assessment & Plan Note (Signed)
I question if his episodic lightheadedness is exacerbated by untreated sleep apnea.  Discussed.  Encouraged to drink plenty of fluids.  Refer to pulmonary.

## 2018-08-26 NOTE — Assessment & Plan Note (Signed)
He was asking about OSA eval/tx.  He has CPAP but had trouble with CPAP mask fit.  D/w pt about pulmonary referral.  I question if his episodic lightheadedness is exacerbated by untreated sleep apnea.  Discussed.

## 2018-08-26 NOTE — Assessment & Plan Note (Signed)
Doing well with current meds.  Taking requip at night and with relief.  No ADE on med.

## 2018-09-04 ENCOUNTER — Encounter: Payer: Self-pay | Admitting: Family Medicine

## 2018-09-28 NOTE — Progress Notes (Signed)
[facility] Consultation      Assessment and Plan:  Excessive daytime sleepiness - Previous history of obstructive sleep apnea, now with continued and worsening signs of daytime sleepiness and snoring. -Per insurance requirements we will need to reestablish the diagnosis of sleep apnea.  Will send for sleep study. - Patient previously was intolerant to CPAP when used about 10 years ago, mostly due to intolerance of pressure.  Hopefully he will be more tolerant of auto CPAP with a low starting pressure.  Hemoptysis.  -Describes a recent bout of bronchitis which is been trailing off, he has had occasional episodes of blood mixed with sputum over the last week which is been trailing off.  Does not appear to be symptomatic at this time. - We will check chest x-ray as precaution.  As his symptoms appear to be resolving I do not think he requires antibiotics at this time.  Orders Placed This Encounter  Procedures  . DG Chest 2 View  . Home sleep test   Return in about 3 months (around 12/28/2018).    Date: 09/29/2018  MRN# [id number] Patrick Oconnor May 10, 1966    Patrick Oconnor is a 53 y.o. old male seen in consultation for chief complaint of:    Chief Complaint  Patient presents with  . Consult    Referred by Dr. Elsie Stain: eval of OSA pt dx with sleep apnea over 10 years ago, he was intolerant on cpap.    HPI:   The patient is a 53 year old male with a history of RLS and sleep apnea.  Patiently typically goes to bed between 12:30 AM and 2:30 AM.  Gets out of bed at 8 AM on workdays, but otherwise between 10 and 11 AM on weekends.  Epworth score is 5 today. He has a history of OSA, but could not tolerate it about 10 years ago. He felt that the pressure was too high, he thinks it was set at 14.  He finds that he is fatigued constantly during the day, he takes a lot of 5 hour energy drinks.  Denies sleep paralysis or sleep walking, no cataplexy. Denies  jaw pain, possible TMJ in the past, no dentures.  His RLS is controlled with requip. He needs to read to fall asleep.   He has been having reflux managed with protonix. He caught bronchitis recently and has been coughing up blood for the past week which is improving.    PMHX:   Past Medical History:  Diagnosis Date  . Anemia   . Asthma, exercise induced   . Degenerative disc disease, lumbar   . Depression   . Environmental allergies   . GERD (gastroesophageal reflux disease)   . History of bronchitis   . Hypertension   . IBS (irritable bowel syndrome)   . Pneumonia    hx of  . PONV (postoperative nausea and vomiting)   . Sleep apnea 9/08   Started CPAP; does not wear CPAP nightly   Surgical Hx:  Past Surgical History:  Procedure Laterality Date  . ACROMIO-CLAVICULAR JOINT REPAIR Right 04/15/2013   Procedure: RIGHT SHOULDER ACROMIO-CLAVICULAR RECONSTRUCTION WITH ALLOGRAFT   ;  Surgeon: Marin Shutter, MD;  Location: [facility];  Service: Orthopedics;  Laterality: Right;  . ACROMIO-CLAVICULAR JOINT REPAIR Right 06/03/2013   Procedure: ACROMIO-CLAVICULAR JOINT REPAIR;  Surgeon: Marin Shutter, MD;  Location: [facility];  Service: Orthopedics;  Laterality: Right;  . ACROMIO-CLAVICULAR JOINT REPAIR Right 10/19/2015   Procedure: OPEN RIGHT  SHOULDER ACROMIO-CLAVICULAR JOINT RECONSTRUCTION ;  Surgeon: Justice Britain, MD;  Location: [facility];  Service: Orthopedics;  Laterality: Right;  . COLONOSCOPY  08/12/2005   "multiple"  . ESOPHAGOGASTRODUODENOSCOPY  3/09   normal; multiple  . ESOPHAGOGASTRODUODENOSCOPY (EGD) WITH PROPOFOL N/A 01/22/2016   Procedure: ESOPHAGOGASTRODUODENOSCOPY (EGD) WITH PROPOFOL;  Surgeon: Laurence Spates, MD;  Location: [facility];  Service: Endoscopy;  Laterality: N/A;  . INCISION AND DRAINAGE Right 06/03/2013   Procedure: INCISION AND DRAINAGE RIGHT SHOULDER;  Surgeon: Marin Shutter, MD;  Location: [facility];  Service: Orthopedics;  Laterality: Right;  . pilonidal cystectomy  42003    x 2  . PYLOROPLASTY  infant   pyloric stenosis  . SEPTOPLASTY    . SHOULDER OPEN ROTATOR CUFF REPAIR Right 10/19/2015   Procedure: OPEN RIGHT SHOULDER ROTATOR CUFF REPAIR ;  Surgeon: Justice Britain, MD;  Location: [facility];  Service: Orthopedics;  Laterality: Right;  . thrombosed external hemorrhoid  9/08   incised  . TONSILLECTOMY AND ADENOIDECTOMY     Family Hx:  Family History  Problem Relation Age of Onset  . Cancer Father 48       prostate Stage 4  . Prostate cancer Father   . Cancer Maternal Grandmother        breast  . Cancer Mother   . Cancer Paternal Grandmother        Colon  . Colon cancer Paternal Grandmother    Social Hx:   Social History   Tobacco Use  . Smoking status: Never Smoker  . Smokeless tobacco: Never Used  Substance Use Topics  . Alcohol use: Yes    Alcohol/week: 0.3 standard drinks    Comment: rarely  . Drug use: No   Medication:    Current Outpatient Medications:  .  albuterol (PROVENTIL,VENTOLIN) 90 MCG/ACT inhaler, Inhale 2 puffs into the lungs every 6 (six) hours as needed for shortness of breath. , Disp: , Rfl:  .  benzonatate (TESSALON PERLES) 100 MG capsule, Take 1-2 capsules (100-200 mg total) by mouth every 8 (eight) hours as needed for cough., Disp: 30 capsule, Rfl: 0 .  citalopram (CELEXA) 20 MG tablet, Take 1 tablet (20 mg total) by mouth daily., Disp: 90 tablet, Rfl: 3 .  ferrous sulfate 325 (65 FE) MG tablet, Take 1 tablet (325 mg total) by mouth every other day., Disp: , Rfl:  .  fluticasone (FLONASE) 50 MCG/ACT nasal spray, Place 2 sprays into both nostrils daily as needed for allergies., Disp: 48 g, Rfl: 3 .  loratadine (CLARITIN) 10 MG tablet, Take 10 mg by mouth daily. , Disp: , Rfl:  .  meloxicam (MOBIC) 15 MG tablet, Take 1 tablet (15 mg total) by mouth daily as needed for pain., Disp: , Rfl:  .  pantoprazole (PROTONIX) 20 MG tablet, Take 1 tablet (20 mg total) by mouth daily., Disp: 90 tablet, Rfl: 3 .  polyethylene glycol powder  (GLYCOLAX/MIRALAX) powder, Take 17 g by mouth 2 (two) times daily as needed (For constipation.)., Disp: , Rfl:  .  Probiotic Product (PROBIOTIC DAILY PO), Take 1 capsule by mouth daily as needed (For digestive health.). , Disp: , Rfl:  .  rOPINIRole (REQUIP) 0.25 MG tablet, Take 1 tablet (0.25 mg total) by mouth at bedtime., Disp: 90 tablet, Rfl: 3 .  sennosides-docusate sodium (SENOKOT-S) 8.6-50 MG tablet, Take 1-2 tablets by mouth daily as needed for constipation., Disp: , Rfl:  .  Tetrahydroz-Dextran-PEG-Povid (VISINE ADVANCED RELIEF OP), Apply 1-2 drops to eye as needed (  For eye irritation.). , Disp: , Rfl:    Allergies:  Patient has no known allergies.  Review of Systems: Gen:  Denies  fever, sweats, chills HEENT: Denies blurred vision, double vision. bleeds, sore throat Cvc:  No dizziness, chest pain. Resp:   Denies cough or sputum production, shortness of breath Gi: Denies swallowing difficulty, stomach pain. Gu:  Denies bladder incontinence, burning urine Ext:   No Joint pain, stiffness. Skin: No skin rash,  hives  Endoc:  No polyuria, polydipsia. Psych: No depression, insomnia. Other:  All other systems were reviewed with the patient and were negative other that what is mentioned in the HPI.   Physical Examination:   VS: BP 130/82 (BP Location: Left Arm, Cuff Size: Large)   Pulse 69   Resp 16   Ht 6\' 2"  (1.88 m)   Wt 251 lb (113.9 kg)   SpO2 98%   BMI 32.23 kg/m   General Appearance: No distress  Neuro:without focal findings,  speech normal,  HEENT: PERRLA, EOM intact.   Pulmonary: normal breath sounds, No wheezing.  CardiovascularNormal S1,S2.  No m/r/g.   Abdomen: Benign, Soft, non-tender. Renal:  No costovertebral tenderness  GU:  No performed at this time. Endoc: No evident thyromegaly, no signs of acromegaly. Skin:   warm, no rashes, no ecchymosis  Extremities: normal, no cyanosis, clubbing.  Other findings:    LABORATORY PANEL:   CBC No results for  input(s): WBC, HGB, HCT, PLT in the last 168 hours. ------------------------------------------------------------------------------------------------------------------  Chemistries  No results for input(s): NA, K, CL, CO2, GLUCOSE, BUN, CREATININE, CALCIUM, MG, AST, ALT, ALKPHOS, BILITOT in the last 168 hours.  Invalid input(s): GFRCGP ------------------------------------------------------------------------------------------------------------------  Cardiac Enzymes No results for input(s): TROPONINI in the last 168 hours. ------------------------------------------------------------  RADIOLOGY:  No results found.     Thank  you for the consultation and for allowing [facility] to assist in the care of your patient. Our recommendations are noted above.  Please contact us if we can be of further service.   Marda Stalker, M.D., F.C.C.P.  Board Certified in Internal Medicine, Pulmonary Medicine, [place], and Sleep Medicine.  [facility] Office Number: [phone]   09/29/2018

## 2018-09-29 ENCOUNTER — Ambulatory Visit
Admission: RE | Admit: 2018-09-29 | Discharge: 2018-09-29 | Disposition: A | Discharge status: Home / Self Care | Payer: 59 | Source: Ambulatory Visit | Attending: Internal Medicine | Admitting: Internal Medicine

## 2018-09-29 ENCOUNTER — Encounter: Payer: Self-pay | Admitting: Internal Medicine

## 2018-09-29 ENCOUNTER — Ambulatory Visit (INDEPENDENT_AMBULATORY_CARE_PROVIDER_SITE_OTHER): Payer: 59 | Admitting: Internal Medicine

## 2018-09-29 VITALS — BP 130/82 | HR 69 | Resp 16 | Ht 74.0 in | Wt 251.0 lb

## 2018-09-29 DIAGNOSIS — R042 Hemoptysis: Secondary | ICD-10-CM | POA: Diagnosis not present

## 2018-09-29 DIAGNOSIS — G4719 Other hypersomnia: Secondary | ICD-10-CM

## 2018-09-29 NOTE — Patient Instructions (Signed)
Will send for sleep study.  Will send for chest x ray.    Sleep Apnea    Sleep apnea is disorder that affects a person's sleep. A person with sleep apnea has abnormal pauses in their breathing when they sleep. It is hard for them to get a good sleep. This makes a person tired during the day. It also can lead to other physical problems. There are three types of sleep apnea. One type is when breathing stops for a short time because your airway is blocked (obstructive sleep apnea). Another type is when the brain sometimes fails to give the normal signal to breathe to the muscles that control your breathing (central sleep apnea). The third type is a combination of the other two types.  HOME CARE   Take all medicine as told by your doctor.  Avoid alcohol, calming medicines (sedatives), and depressant drugs.  Try to lose weight if you are overweight. Talk to your doctor about a healthy weight goal.  Your doctor may have you use a device that helps to open your airway. It can help you get the air that you need. It is called a positive airway pressure (PAP) device.   MAKE SURE YOU:   Understand these instructions.  Will watch your condition.  Will get help right away if you are not doing well or get worse.  It may take approximately 1 month for you to get used to wearing her CPAP every night.  Be sure to work with your machine to get used to it, be patient, it may take time!  If you have trouble tolerating CPAP DO NOT RETURN YOUR MACHINE; Contact our office to see if we can help you tolerate the CPAP better first!

## 2018-10-20 ENCOUNTER — Telehealth: Payer: Self-pay | Admitting: Internal Medicine

## 2018-10-20 NOTE — Telephone Encounter (Signed)
Sending to [facility] for set up.

## 2018-10-21 NOTE — Telephone Encounter (Signed)
Authorization process completed and patient has been scheduled to p/u device 10/23/2018 at 3:00.  Pt is aware and nothing else needed at this time. Rhonda J Cobb

## 2018-10-26 DIAGNOSIS — G4733 Obstructive sleep apnea (adult) (pediatric): Secondary | ICD-10-CM | POA: Diagnosis not present

## 2018-10-28 ENCOUNTER — Telehealth: Payer: Self-pay | Admitting: Internal Medicine

## 2018-10-28 DIAGNOSIS — G4719 Other hypersomnia: Secondary | ICD-10-CM

## 2018-10-28 DIAGNOSIS — G4733 Obstructive sleep apnea (adult) (pediatric): Secondary | ICD-10-CM | POA: Diagnosis not present

## 2018-10-28 NOTE — Telephone Encounter (Signed)
HST performed on 10/25/18 confirmed severe OSA with AHI of 38. Recommend auto cpap 5-20cm h2O. Lm for pt to relay results.

## 2018-10-29 NOTE — Telephone Encounter (Signed)
2nd message left to call back

## 2018-10-29 NOTE — Telephone Encounter (Signed)
Pt is aware of results and voiced his understanding.  Pt wishes to proceed with cpap, however he would like to call back with name of DME he wishes to use and schedule compliance visit.  Will await call back.

## 2018-11-10 MED FILL — PANTOPRAZOLE SOD DR 20 MG T: 20 | 90 days supply | Qty: 90 | Fill #0

## 2018-11-10 MED FILL — CITALOPRAM HBR 20 MG TABLET: 20 | 90 days supply | Qty: 90 | Fill #0

## 2018-11-10 MED FILL — FLUTICASONE PROP 50 MCG SPR: 50 | 30 days supply | Qty: 16 | Fill #0

## 2018-11-11 NOTE — Telephone Encounter (Signed)
Lm for update.  

## 2018-11-24 NOTE — Telephone Encounter (Signed)
Lm for update x2

## 2018-12-16 NOTE — Telephone Encounter (Signed)
Letter has been mailed to address on file.  

## 2018-12-16 NOTE — Telephone Encounter (Signed)
Lm x3. Will close encounter per office protocol.

## 2018-12-29 NOTE — Telephone Encounter (Signed)
lmtcb

## 2018-12-29 NOTE — Telephone Encounter (Signed)
Pt calling back about CPAP. Wants to go through [facility] but has a few questions and recommendations.  CB# Home- [place]

## 2018-12-31 NOTE — Addendum Note (Signed)
Addended by: Darreld Mclean on: 12/31/2018 11:39 AM   Modules accepted: Orders

## 2018-12-31 NOTE — Telephone Encounter (Signed)
Spoke to patient, he has concerns about wanting to be able to go into the office and get fit for CPAP device. He is also very hesitant to start immediately due to the Covid situation. Explained that we would send order over and he can relay that information to [facility] when they call to set up. Nothing further needed at this time.

## 2019-01-08 MED FILL — rOPINIRole HCL 0.25 MG TABS: 0.25 | 90 days supply | Qty: 90 | Fill #1

## 2019-02-10 DIAGNOSIS — G4733 Obstructive sleep apnea (adult) (pediatric): Secondary | ICD-10-CM | POA: Diagnosis not present

## 2019-02-15 MED FILL — PANTOPRAZOLE SOD DR 20 MG T: 20 | 90 days supply | Qty: 90 | Fill #0

## 2019-02-15 MED FILL — FLUTICASONE PROP 50 MCG SPR: 50 | 30 days supply | Qty: 16 | Fill #0

## 2019-02-15 MED FILL — CITALOPRAM HBR 20 MG TABLET: 20 | 90 days supply | Qty: 90 | Fill #0

## 2019-02-18 ENCOUNTER — Telehealth: Payer: Self-pay | Admitting: Family Medicine

## 2019-02-18 NOTE — Telephone Encounter (Signed)
Patient called and scheduled his annual physical for next year, He wanted to make sure he will need the fasting labs prior to his appointment since he stated the cpe for this year he did not have these labs prior.   Thanks!

## 2019-02-21 NOTE — Telephone Encounter (Signed)
It looks like he is scheduled for March of next year for a physical with labs ahead of time.  This should be good to go.  Thanks.

## 2019-03-13 DIAGNOSIS — G4733 Obstructive sleep apnea (adult) (pediatric): Secondary | ICD-10-CM | POA: Diagnosis not present

## 2019-03-26 ENCOUNTER — Ambulatory Visit (INDEPENDENT_AMBULATORY_CARE_PROVIDER_SITE_OTHER): Payer: 59 | Admitting: Family Medicine

## 2019-03-26 ENCOUNTER — Encounter: Payer: Self-pay | Admitting: Family Medicine

## 2019-03-26 VITALS — Ht 74.0 in | Wt 252.0 lb

## 2019-03-26 DIAGNOSIS — J029 Acute pharyngitis, unspecified: Secondary | ICD-10-CM | POA: Diagnosis not present

## 2019-03-26 NOTE — Progress Notes (Signed)
Virtual Visit via Video Note  I connected with Patrick Oconnor on 03/26/19 at  4:00 PM EDT by a video enabled telemedicine application and verified that I am speaking with the correct person using two identifiers.  Location: Patient: In his parked car Provider: [place].    I discussed the limitations of evaluation and management by telemedicine and the availability of in person appointments. The patient expressed understanding and agreed to proceed.  History of Present Illness: Chief Complaint  Patient presents with  . Sore Throat    x 3 days. very sore throat, "firey blistery sore" Denies cough, SOB or fever. No other covid related symptoms. No known exposure, no travel. 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    This is a 53 yo male who requests virtual visit for the above chief complaint.  He is noticed 3 days of intermittent throat pain that feels like "blisters."  The pain started 3 days ago in the evening.  He does not feel like he is having a lot of postnasal drainage.  He is having a little bit of nasal drainage.  He has not had any ear pain, fever, swollen lymph nodes in his neck.  He has had a tickle in his throat with a little recent dry cough.  He attributes this to wearing his mask today.  He denies symptoms of acid indigestion or GERD.  He is currently on pantoprazole 20 mg daily.  He has taken Aleve with some relief of symptoms.  He has a history of seasonal allergies and takes fluticasone nasal spray but has not used it in about a week due to being out of his house caring for his mother.  He is unable to visualize his throat.  He has a history of tonsillectomy in the remote past.  A member of his household has had a recent viral illness with bilateral ear pain.  Past Medical History:  Diagnosis Date  . Anemia   . Asthma, exercise induced   . Degenerative disc disease, lumbar   . Depression   . Environmental allergies   . GERD (gastroesophageal reflux disease)   . History  of bronchitis   . Hypertension   . IBS (irritable bowel syndrome)   . Pneumonia    hx of  . PONV (postoperative nausea and vomiting)   . Sleep apnea 9/08   Started CPAP; does not wear CPAP nightly   Past Surgical History:  Procedure Laterality Date  . ACROMIO-CLAVICULAR JOINT REPAIR Right 04/15/2013   Procedure: RIGHT SHOULDER ACROMIO-CLAVICULAR RECONSTRUCTION WITH ALLOGRAFT   ;  Surgeon: Marin Shutter, MD;  Location: [place];  Service: Orthopedics;  Laterality: Right;  . ACROMIO-CLAVICULAR JOINT REPAIR Right 06/03/2013   Procedure: ACROMIO-CLAVICULAR JOINT REPAIR;  Surgeon: Marin Shutter, MD;  Location: [place];  Service: Orthopedics;  Laterality: Right;  . ACROMIO-CLAVICULAR JOINT REPAIR Right 10/19/2015   Procedure: OPEN RIGHT SHOULDER ACROMIO-CLAVICULAR JOINT RECONSTRUCTION ;  Surgeon: Justice Britain, MD;  Location: [place];  Service: Orthopedics;  Laterality: Right;  . COLONOSCOPY  08/12/2005   "multiple"  . ESOPHAGOGASTRODUODENOSCOPY  3/09   normal; multiple  . ESOPHAGOGASTRODUODENOSCOPY (EGD) WITH PROPOFOL N/A 01/22/2016   Procedure: ESOPHAGOGASTRODUODENOSCOPY (EGD) WITH PROPOFOL;  Surgeon: Laurence Spates, MD;  Location: [facility];  Service: Endoscopy;  Laterality: N/A;  . INCISION AND DRAINAGE Right 06/03/2013   Procedure: INCISION AND DRAINAGE RIGHT SHOULDER;  Surgeon: Marin Shutter, MD;  Location: [place];  Service: Orthopedics;  Laterality: Right;  .  pilonidal cystectomy  42003   x 2  . PYLOROPLASTY  infant   pyloric stenosis  . SEPTOPLASTY    . SHOULDER OPEN ROTATOR CUFF REPAIR Right 10/19/2015   Procedure: OPEN RIGHT SHOULDER ROTATOR CUFF REPAIR ;  Surgeon: Justice Britain, MD;  Location: [place];  Service: Orthopedics;  Laterality: Right;  . thrombosed external hemorrhoid  9/08   incised  . TONSILLECTOMY AND ADENOIDECTOMY     Family History  Problem Relation Age of Onset  . Cancer Father 46       prostate Stage 4  . Prostate cancer Father   . Cancer Maternal Grandmother         breast  . Cancer Mother   . Cancer Paternal Grandmother        Colon  . Colon cancer Paternal Grandmother    Social History   Tobacco Use  . Smoking status: Never Smoker  . Smokeless tobacco: Never Used  Substance Use Topics  . Alcohol use: Yes    Alcohol/week: 0.3 standard drinks    Comment: rarely  . Drug use: No      Observations/Objective: Physical Exam  Constitutional: He is oriented to person, place, and time and well-developed, well-nourished, and in no distress. No distress.  HENT:  Head: Normocephalic and atraumatic.  Eyes: Conjunctivae are normal.  Several episodes of clearing his throat witnessed.  Pulmonary/Chest: Effort normal.  Neurological: He is oriented to person, place, and time.  Skin: He is not diaphoretic.  Psychiatric: Mood, memory, affect and judgment normal.    Ht 6\' 2"  (1.88 m)   Wt 252 lb (114.3 kg)   BMI 32.35 kg/m  Wt Readings from Last 3 Encounters:  03/26/19 252 lb (114.3 kg)  09/29/18 251 lb (113.9 kg)  08/25/18 257 lb 12 oz (116.9 kg)   BP Readings from Last 3 Encounters:  09/29/18 130/82  08/25/18 116/70  07/28/18 106/76    Assessment and Plan: 1. Sore throat -Low suspicion for bacterial infection as patient has remote tonsillectomy, per patient report does not have swollen lymph nodes in the neck -Discussed likelihood of viral illness and discussed symptomatic treatment -Could also be related to allergies, postnasal drainage and I have encouraged him to resume his fluticasone nasal spray daily and to consider adding a long-acting, nonsedating antihistamine -He was instructed to follow-up if no improvement in 4 to 5 days or if symptoms worsen   Clarene Reamer, FNP-BC  [facility] at [facility], [place]  03/26/2019 4:31 PM   Follow Up Instructions: Visit recap sent to patient via my chart   I discussed the assessment and treatment plan with the patient. The patient was provided an opportunity  to ask questions and all were answered. The patient agreed with the plan and demonstrated an understanding of the instructions.   The patient was advised to call back or seek an in-person evaluation if the symptoms worsen or if the condition fails to improve as anticipated.   Elby Beck, FNP

## 2019-04-09 ENCOUNTER — Telehealth: Payer: Self-pay

## 2019-04-09 DIAGNOSIS — G2581 Restless legs syndrome: Secondary | ICD-10-CM

## 2019-04-09 DIAGNOSIS — R42 Dizziness and giddiness: Secondary | ICD-10-CM

## 2019-04-09 MED ORDER — ROPINIROLE HCL 0.5 MG PO TABS: 0.5000 mg | ORAL_TABLET | Freq: Every day | ORAL | 1 refills | Status: DC

## 2019-04-09 NOTE — Telephone Encounter (Signed)
Sent with adjusted rx.  If this doesn't help then we need to recheck him.  Thanks.

## 2019-04-09 NOTE — Telephone Encounter (Signed)
Patient advised.

## 2019-04-09 NOTE — Telephone Encounter (Signed)
He has run of ropinirole. He is currently on 0.25mg . Thinks he needs a stronger dose because there are some nights it does not work. He cannot take it right before bed and it does not start to work. He has to take it earlier in the evening. He says at times, he takes 2 at time. Asking that it be sent to [place] [employer]. Call pt at [phone] with any questions.

## 2019-04-12 MED FILL — rOPINIRole HCL 0.25 MG TABS: 0.25 | 90 days supply | Qty: 90 | Fill #2

## 2019-04-13 DIAGNOSIS — G4733 Obstructive sleep apnea (adult) (pediatric): Secondary | ICD-10-CM | POA: Diagnosis not present

## 2019-05-13 DIAGNOSIS — G4733 Obstructive sleep apnea (adult) (pediatric): Secondary | ICD-10-CM | POA: Diagnosis not present

## 2019-05-31 MED FILL — CITALOPRAM HBR 20 MG TABLET: 20 | 90 days supply | Qty: 90 | Fill #1

## 2019-05-31 MED FILL — PANTOPRAZOLE SOD DR 20 MG T: 20 | 90 days supply | Qty: 90 | Fill #1

## 2019-06-11 DIAGNOSIS — G4733 Obstructive sleep apnea (adult) (pediatric): Secondary | ICD-10-CM | POA: Diagnosis not present

## 2019-06-13 DIAGNOSIS — G4733 Obstructive sleep apnea (adult) (pediatric): Secondary | ICD-10-CM | POA: Diagnosis not present

## 2019-06-15 ENCOUNTER — Encounter: Payer: Self-pay | Admitting: Family Medicine

## 2019-06-29 ENCOUNTER — Telehealth: Payer: Self-pay

## 2019-06-29 MED FILL — rOPINIRole HCL 0.5 MG TABS: 0.5 | 90 days supply | Qty: 90 | Fill #0

## 2019-06-29 NOTE — Telephone Encounter (Signed)
Pt is out of ropinirole and pt request refill to [facility] outpt pharmacy. Pt did not pick up rx sent in to [facility] [address] 04/09/19. Advised pt to have [facility] contact [facility] about transferring rx. Pt voiced understanding and pt scheduled 30' in office appt 07/16/19 at 8:45 to discuss restless leg.(1st appt that was convenient for pt) Pt has no covid symptoms, no travel and no known exposure to + covid.

## 2019-07-16 ENCOUNTER — Encounter: Payer: Self-pay | Admitting: Family Medicine

## 2019-07-16 ENCOUNTER — Ambulatory Visit (INDEPENDENT_AMBULATORY_CARE_PROVIDER_SITE_OTHER): Payer: 59 | Admitting: Family Medicine

## 2019-07-16 ENCOUNTER — Other Ambulatory Visit: Payer: Self-pay

## 2019-07-16 VITALS — BP 136/84 | HR 63 | Temp 97.9°F | Ht 74.0 in | Wt 261.4 lb

## 2019-07-16 DIAGNOSIS — G4730 Sleep apnea, unspecified: Secondary | ICD-10-CM

## 2019-07-16 DIAGNOSIS — R5383 Other fatigue: Secondary | ICD-10-CM | POA: Diagnosis not present

## 2019-07-16 DIAGNOSIS — G2581 Restless legs syndrome: Secondary | ICD-10-CM | POA: Diagnosis not present

## 2019-07-16 DIAGNOSIS — E786 Lipoprotein deficiency: Secondary | ICD-10-CM | POA: Diagnosis not present

## 2019-07-16 DIAGNOSIS — K219 Gastro-esophageal reflux disease without esophagitis: Secondary | ICD-10-CM

## 2019-07-16 DIAGNOSIS — Z8042 Family history of malignant neoplasm of prostate: Secondary | ICD-10-CM

## 2019-07-16 LAB — LIPID PANEL
Cholesterol: 142 mg/dL (ref 0–200)
HDL: 26.6 mg/dL — ABNORMAL LOW (ref >39.00)
LDL Cholesterol: 80 mg/dL (ref 0–99)
NonHDL: 115.09
Total CHOL/HDL Ratio: 5
Triglycerides: 174 mg/dL — ABNORMAL HIGH (ref 0.0–149.0)
VLDL: 34.8 mg/dL (ref 0.0–40.0)

## 2019-07-16 LAB — COMPREHENSIVE METABOLIC PANEL
ALT: 22 U/L (ref 0–53)
AST: 20 U/L (ref 0–37)
Albumin: 4.4 g/dL (ref 3.5–5.2)
Alkaline Phosphatase: 95 U/L (ref 39–117)
BUN: 19 mg/dL (ref 6–23)
CO2: 28 mEq/L (ref 19–32)
Calcium: 8.8 mg/dL (ref 8.4–10.5)
Chloride: 102 mEq/L (ref 96–112)
Creatinine, Ser: 0.98 mg/dL (ref 0.40–1.50)
GFR: 79.21 mL/min (ref >60.00)
Glucose, Bld: 78 mg/dL (ref 70–99)
Potassium: 4 mEq/L (ref 3.5–5.1)
Sodium: 141 mEq/L (ref 135–145)
Total Bilirubin: 0.4 mg/dL (ref 0.2–1.2)
Total Protein: 6.9 g/dL (ref 6.0–8.3)

## 2019-07-16 LAB — CBC WITH DIFFERENTIAL/PLATELET
Basophils Absolute: 0 10*3/uL (ref 0.0–0.1)
Basophils Relative: 0.5 % (ref 0.0–3.0)
Eosinophils Absolute: 0.3 10*3/uL (ref 0.0–0.7)
Eosinophils Relative: 4.1 % (ref 0.0–5.0)
HCT: 39.1 % (ref 39.0–52.0)
Hemoglobin: 12.9 g/dL — ABNORMAL LOW (ref 13.0–17.0)
Lymphocytes Relative: 14.4 % (ref 12.0–46.0)
Lymphs Abs: 1.1 10*3/uL (ref 0.7–4.0)
MCHC: 33 g/dL (ref 30.0–36.0)
MCV: 82.2 fl (ref 78.0–100.0)
Monocytes Absolute: 0.7 10*3/uL (ref 0.1–1.0)
Monocytes Relative: 8.9 % (ref 3.0–12.0)
Neutro Abs: 5.5 10*3/uL (ref 1.4–7.7)
Neutrophils Relative %: 72.1 % (ref 43.0–77.0)
Platelets: 203 10*3/uL (ref 150.0–400.0)
RBC: 4.76 Mil/uL (ref 4.22–5.81)
RDW: 14 % (ref 11.5–15.5)
WBC: 7.6 10*3/uL (ref 4.0–10.5)

## 2019-07-16 LAB — IBC PANEL
Iron: 85 ug/dL (ref 42–165)
Saturation Ratios: 21.4 % (ref 20.0–50.0)
Transferrin: 284 mg/dL (ref 212.0–360.0)

## 2019-07-16 LAB — TESTOSTERONE: Testosterone: 146.46 ng/dL — ABNORMAL LOW (ref 300.00–890.00)

## 2019-07-16 LAB — PSA: PSA: 0.33 ng/mL (ref 0.10–4.00)

## 2019-07-16 LAB — TSH: TSH: 1.04 u[IU]/mL (ref 0.35–4.50)

## 2019-07-16 NOTE — Patient Instructions (Signed)
Thanks for getting a flu shot.  Go to the lab on the way out.  We'll contact you with your lab report. Try taking protonix at night.  If that doesn't help, then we can change the dose.  Don't change your meds otherwise.  We may need to see about increasing the requip after I see your labs.  Take care.  Glad to see you.

## 2019-07-16 NOTE — Progress Notes (Signed)
This visit occurred during the SARS-CoV-2 public health emergency.  Safety protocols were in place, including screening questions prior to the visit, additional usage of staff PPE, and extensive cleaning of exam room while observing appropriate contact time as indicated for disinfecting solutions.   RLS.  He is still on iron and requip.  Lower dose of requip didn't help but 0.5mg  helped.  Compliant.  See notes on labs.  He changed from prilosec to protonix.  He is noting more nighttime GERD with 20mg  a day.  No severe sx but prilosec helped more.  He was changed off prilosec due to SSRI use.  He is taking PPI in the AM.    We talked about his mother passing.  He is getting by and trying to adjust to the changes.    He has still noted occ orthostatic changes with standing, the episodes self resolve.  This is longstanding, going on episodically for years.    PSA pending, FH noted.  Discussed screening.  shingrix d/w pt.   OSA, on CPAP, compliant. Sleeping better with use.  Fatigue noted, d/w pt.  Labs pending.  Fatigue noted in spite of being compliant with CPAP.  PMH and SH reviewed  ROS: Per HPI unless specifically indicated in ROS section   Meds, vitals, and allergies reviewed.   GEN: nad, alert and oriented HEENT: ncat NECK: supple w/o LA CV: rrr. PULM: ctab, no inc wob ABD: soft, +bs EXT: trace BLE edema SKIN: no acute rash

## 2019-07-18 ENCOUNTER — Other Ambulatory Visit: Payer: Self-pay | Admitting: Family Medicine

## 2019-07-18 DIAGNOSIS — J309 Allergic rhinitis, unspecified: Secondary | ICD-10-CM

## 2019-07-18 NOTE — Assessment & Plan Note (Addendum)
Noted in spite of compliance with CPAP.  Discussed checking routine labs today.  See notes on labs. >25 minutes spent in face to face time with patient, >50% spent in counselling or coordination of care

## 2019-07-18 NOTE — Assessment & Plan Note (Signed)
  OSA, on CPAP, compliant. Sleeping better with use.

## 2019-07-18 NOTE — Assessment & Plan Note (Signed)
He is still on iron and requip.  Lower dose of requip didn't help but 0.5mg  helped.  Compliant.  See notes on labs.

## 2019-07-18 NOTE — Assessment & Plan Note (Signed)
PSA pending, FH noted.  Discussed screening.

## 2019-07-18 NOTE — Assessment & Plan Note (Signed)
He can try taking protonix at night.  If that doesn't help, then we can consider changing the dose.

## 2019-08-03 DIAGNOSIS — H5212 Myopia, left eye: Secondary | ICD-10-CM | POA: Diagnosis not present

## 2019-08-03 DIAGNOSIS — H5201 Hypermetropia, right eye: Secondary | ICD-10-CM | POA: Diagnosis not present

## 2019-08-03 DIAGNOSIS — H35363 Drusen (degenerative) of macula, bilateral: Secondary | ICD-10-CM | POA: Diagnosis not present

## 2019-08-03 DIAGNOSIS — H52223 Regular astigmatism, bilateral: Secondary | ICD-10-CM | POA: Diagnosis not present

## 2019-08-30 ENCOUNTER — Other Ambulatory Visit: Payer: Self-pay | Admitting: Family Medicine

## 2019-08-30 MED FILL — PANTOPRAZOLE SOD DR 20 MG T: 20 | 90 days supply | Qty: 90 | Fill #0

## 2019-08-31 DIAGNOSIS — M25562 Pain in left knee: Secondary | ICD-10-CM | POA: Diagnosis not present

## 2019-08-31 MED FILL — CITALOPRAM HBR 20 MG TABLET: 20 | 90 days supply | Qty: 90 | Fill #0

## 2019-08-31 NOTE — Telephone Encounter (Signed)
Sent. Thanks.   

## 2019-08-31 NOTE — Telephone Encounter (Signed)
Pt left v/m cking on status of citalopram refill to [facility]. Pt notified and voiced understanding that citalopram had been sent electronically 08/31/19 to [facility]. Pt voiced understanding and nothing further needed.

## 2019-09-21 ENCOUNTER — Ambulatory Visit (INDEPENDENT_AMBULATORY_CARE_PROVIDER_SITE_OTHER): Payer: 59 | Admitting: Family Medicine

## 2019-09-21 ENCOUNTER — Other Ambulatory Visit: Payer: Self-pay | Admitting: Family Medicine

## 2019-09-21 DIAGNOSIS — R42 Dizziness and giddiness: Secondary | ICD-10-CM

## 2019-09-21 DIAGNOSIS — G2581 Restless legs syndrome: Secondary | ICD-10-CM

## 2019-09-21 DIAGNOSIS — J019 Acute sinusitis, unspecified: Secondary | ICD-10-CM | POA: Diagnosis not present

## 2019-09-21 MED ORDER — AMOXICILLIN-POT CLAVULANATE 875-125 MG PO TABS: 1.0000 | ORAL_TABLET | Freq: Two times a day (BID) | ORAL | 0 refills | Status: DC

## 2019-09-21 MED FILL — rOPINIRole HCL 0.5 MG TABS: 0.5 | 90 days supply | Qty: 90 | Fill #0

## 2019-09-21 MED FILL — AMOX-CLAV 875-125 MG TABLET: 875-125 | 10 days supply | Qty: 20 | Fill #0

## 2019-09-21 NOTE — Telephone Encounter (Signed)
rx sent, pt aware, dose adjusted to current dose.

## 2019-09-21 NOTE — Progress Notes (Signed)
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   Limitations and rationale for visit method d/w patient.  Patient agreed to proceed.   CC: URI sx.    HPI:  He needed requip refill.  It has helped if taken early enough in the night.  If taken right before bed it doesn't help.  No ADE on med.    Recent sx d/w pt.  HA w/o relief from nsaids.  Stuffy, discolored rhinorrhea.  Sx going for about 1 week.  No fevers.  No covid exposures.  He had a sweat last night.  Not SOB except for nasal congestion.   He had pfizer covid vaccine x2 doses.   08/18/19 and 09/08/19  Meds and allergies reviewed.   ROS: Per HPI unless specifically indicated in ROS section   NAD Speech wnl  A/P:  Presumed sinusitis.  Start augmentin.  Nontoxic.  Still okay for outpatient follow-up.  He agrees.  Routine cautions given.  requip rx sent.  He will update me as needed.

## 2019-09-21 NOTE — Telephone Encounter (Signed)
Refill request Requip  Last refill 04/09/19 #90/1 Last office visit 07/16/19

## 2019-09-22 NOTE — Assessment & Plan Note (Signed)
  requip rx sent.  He will update me as needed.

## 2019-09-22 NOTE — Assessment & Plan Note (Signed)
Presumed sinusitis.  Start augmentin.  Nontoxic.  Still okay for outpatient follow-up.  He agrees.  Routine cautions given.

## 2019-10-17 ENCOUNTER — Other Ambulatory Visit: Payer: Self-pay | Admitting: Family Medicine

## 2019-10-17 DIAGNOSIS — D500 Iron deficiency anemia secondary to blood loss (chronic): Secondary | ICD-10-CM

## 2019-10-17 DIAGNOSIS — E781 Pure hyperglyceridemia: Secondary | ICD-10-CM

## 2019-10-26 ENCOUNTER — Other Ambulatory Visit (INDEPENDENT_AMBULATORY_CARE_PROVIDER_SITE_OTHER): Payer: 59

## 2019-10-26 ENCOUNTER — Other Ambulatory Visit: Payer: Self-pay

## 2019-10-26 DIAGNOSIS — D500 Iron deficiency anemia secondary to blood loss (chronic): Secondary | ICD-10-CM

## 2019-10-26 DIAGNOSIS — E781 Pure hyperglyceridemia: Secondary | ICD-10-CM

## 2019-10-26 LAB — CBC WITH DIFFERENTIAL/PLATELET
Basophils Absolute: 0 10*3/uL (ref 0.0–0.1)
Basophils Relative: 0.7 % (ref 0.0–3.0)
Eosinophils Absolute: 0.3 10*3/uL (ref 0.0–0.7)
Eosinophils Relative: 4.2 % (ref 0.0–5.0)
HCT: 39.1 % (ref 39.0–52.0)
Hemoglobin: 13 g/dL (ref 13.0–17.0)
Lymphocytes Relative: 19.4 % (ref 12.0–46.0)
Lymphs Abs: 1.4 10*3/uL (ref 0.7–4.0)
MCHC: 33.2 g/dL (ref 30.0–36.0)
MCV: 85.1 fl (ref 78.0–100.0)
Monocytes Absolute: 0.4 10*3/uL (ref 0.1–1.0)
Monocytes Relative: 5.6 % (ref 3.0–12.0)
Neutro Abs: 4.9 10*3/uL (ref 1.4–7.7)
Neutrophils Relative %: 70.1 % (ref 43.0–77.0)
Platelets: 212 10*3/uL (ref 150.0–400.0)
RBC: 4.59 Mil/uL (ref 4.22–5.81)
RDW: 15.1 % (ref 11.5–15.5)
WBC: 7 10*3/uL (ref 4.0–10.5)

## 2019-10-26 LAB — LDL CHOLESTEROL, DIRECT: Direct LDL: 94 mg/dL

## 2019-10-26 LAB — LIPID PANEL
Cholesterol: 144 mg/dL (ref 0–200)
HDL: 25.8 mg/dL — ABNORMAL LOW (ref >39.00)
NonHDL: 117.94
Total CHOL/HDL Ratio: 6
Triglycerides: 256 mg/dL — ABNORMAL HIGH (ref 0.0–149.0)
VLDL: 51.2 mg/dL — ABNORMAL HIGH (ref 0.0–40.0)

## 2019-10-26 LAB — IRON: Iron: 47 ug/dL (ref 42–165)

## 2019-11-01 ENCOUNTER — Other Ambulatory Visit: Payer: Self-pay

## 2019-11-01 ENCOUNTER — Ambulatory Visit (INDEPENDENT_AMBULATORY_CARE_PROVIDER_SITE_OTHER): Payer: 59 | Admitting: Family Medicine

## 2019-11-01 ENCOUNTER — Encounter: Payer: Self-pay | Admitting: Family Medicine

## 2019-11-01 VITALS — BP 132/86 | HR 62 | Temp 96.2°F | Ht 74.0 in | Wt 259.4 lb

## 2019-11-01 DIAGNOSIS — D509 Iron deficiency anemia, unspecified: Secondary | ICD-10-CM

## 2019-11-01 DIAGNOSIS — Z0000 Encounter for general adult medical examination without abnormal findings: Secondary | ICD-10-CM

## 2019-11-01 DIAGNOSIS — Z7189 Other specified counseling: Secondary | ICD-10-CM

## 2019-11-01 DIAGNOSIS — G4730 Sleep apnea, unspecified: Secondary | ICD-10-CM

## 2019-11-01 DIAGNOSIS — F32A Depression, unspecified: Secondary | ICD-10-CM

## 2019-11-01 DIAGNOSIS — F329 Major depressive disorder, single episode, unspecified: Secondary | ICD-10-CM

## 2019-11-01 DIAGNOSIS — G2581 Restless legs syndrome: Secondary | ICD-10-CM

## 2019-11-01 DIAGNOSIS — R42 Dizziness and giddiness: Secondary | ICD-10-CM

## 2019-11-01 MED ORDER — FERROUS SULFATE 325 (65 FE) MG PO TABS: 325.0000 mg | ORAL_TABLET | Freq: Every day | ORAL | Status: AC

## 2019-11-01 NOTE — Patient Instructions (Addendum)
Check your pulse prior to standing when you have been sitting for a while at work.  Then check your pulse on standing.    Check with your insurance to see if they will cover the shingrix shot.  Follow up with GI and I'll await those notes.  I would hold off with blood donation.  Take care.  Glad to see you.  Thanks for getting a covid vaccine.

## 2019-11-01 NOTE — Progress Notes (Signed)
This visit occurred during the SARS-CoV-2 public health emergency.  Safety protocols were in place, including screening questions prior to the visit, additional usage of staff PPE, and extensive cleaning of exam room while observing appropriate contact time as indicated for disinfecting solutions.  CPE- See plan.  Routine anticipatory guidance given to patient.  See health maintenance.  The possibility exists that previously documented standard health maintenance information may have been brought forward from a previous encounter into this note.  If needed, that same information has been updated to reflect the current situation based on today's encounter.    covid vaccine 2021 Tetanus 2013 Flu done at work PNA not due Shingles d/w pt.   Colonoscopy 2016, f/u pending in 2021 PSA 2020 Diet and exercise d/w pt.    Advance directive d/w pt.  Bother and sister in law designated if patient were incapacitated.   RLS.  On iron replacement daily.  No ADE on med.  Still on requip at baseline.  RLS was better on current meds but still with occ breakthrough sx.  No troubles in the last week but had a "bad week" the week prior.  More sx after he gave blood.  Discussed with patient about avoiding blood donation in the near future.  OSA.  Compliant with CPAP, used every night.  Sleeping better with use.    He has had troubles across the bridge of his nose, internally irritated.  Getting better in the meantime.  Started around the time of the sinus infection.  No trauma.  No fevers.  Prev sinusitis resolved.   Mood dw pt.  He is working through grief.  "I'm generally fine" but he is trying to manage his mother's estate.  condolences offered.    He has prolonged time at the computer (sitting) and then if he gets up he'll get lightheaded.  No syncope.  It doesn't always happen.  This is longstanding.    PMH and SH reviewed  Meds, vitals, and allergies reviewed.   ROS: Per HPI.  Unless specifically  indicated otherwise in HPI, the patient denies:  General: fever. Eyes: acute vision changes ENT: sore throat Cardiovascular: chest pain Respiratory: SOB GI: vomiting GU: dysuria Musculoskeletal: acute back pain Derm: acute rash Neuro: acute motor dysfunction Psych: worsening mood Endocrine: polydipsia Heme: bleeding Allergy: hayfever  GEN: nad, alert and oriented HEENT: ncat, TM wnl B, nasal and oral exam unremarkable.  No lesions noted (he can let me know if the sensation of nasal irritation persists) NECK: supple w/o LA CV: rrr. PULM: ctab, no inc wob ABD: soft, +bs EXT: no edema SKIN: no acute rash

## 2019-11-03 NOTE — Assessment & Plan Note (Signed)
covid vaccine 2021 Tetanus 2013 Flu done at work PNA not due Shingles d/w pt.   Colonoscopy 2016, f/u pending in 2021 PSA 2020 Diet and exercise d/w pt.    Advance directive d/w pt.  Bother and sister in law designated if patient were incapacitated.

## 2019-11-03 NOTE — Assessment & Plan Note (Addendum)
On iron replacement daily.  No ADE on med.  Still on requip at baseline.  RLS was better on current meds but still with occ breakthrough sx.  No troubles in the last week but had a "bad week" the week prior.  More sx after he gave blood.  Discussed with patient about avoiding blood donation in the near future.  No change in meds at this point.  He has GI follow-up pending.

## 2019-11-03 NOTE — Assessment & Plan Note (Signed)
On recheck his pulse before and after standing and see if he notices any significant differences.  Caution on standing quickly.  He will update me as needed.  He agrees.

## 2019-11-03 NOTE — Assessment & Plan Note (Signed)
Advance directive d/w pt.  Bother and sister in law designated if patient were incapacitated.   

## 2019-11-03 NOTE — Assessment & Plan Note (Signed)
Mood dw pt.  He is working through grief.  "I'm generally fine" but he is trying to manage his mother's estate.  condolences offered.   We will continue citalopram.  He agrees.

## 2019-11-03 NOTE — Assessment & Plan Note (Signed)
Compliant with CPAP, used every night.  Sleeping better with use.

## 2019-11-19 MED FILL — FLUTICASONE PROP 50 MCG SPR: 50 | 30 days supply | Qty: 16 | Fill #1

## 2019-11-30 MED FILL — CITALOPRAM HBR 20 MG TABLET: 20 | 90 days supply | Qty: 90 | Fill #1

## 2019-11-30 MED FILL — PANTOPRAZOLE SOD DR 20 MG T: 20 | 90 days supply | Qty: 90 | Fill #1

## 2019-12-07 MED FILL — rOPINIRole HCL 0.5 MG TABS: 0.5 | 90 days supply | Qty: 90 | Fill #1

## 2019-12-09 MED FILL — NULYTELY LEMON-LI SOL: 2 days supply | Qty: 4000 | Fill #0

## 2019-12-14 DIAGNOSIS — Z1159 Encounter for screening for other viral diseases: Secondary | ICD-10-CM | POA: Diagnosis not present

## 2019-12-17 DIAGNOSIS — Q438 Other specified congenital malformations of intestine: Secondary | ICD-10-CM | POA: Diagnosis not present

## 2019-12-17 DIAGNOSIS — K633 Ulcer of intestine: Secondary | ICD-10-CM | POA: Diagnosis not present

## 2019-12-17 DIAGNOSIS — Z800 Family history of malignant neoplasm of digestive organs: Secondary | ICD-10-CM | POA: Diagnosis not present

## 2019-12-17 DIAGNOSIS — K648 Other hemorrhoids: Secondary | ICD-10-CM | POA: Diagnosis not present

## 2019-12-17 DIAGNOSIS — K5289 Other specified noninfective gastroenteritis and colitis: Secondary | ICD-10-CM | POA: Diagnosis not present

## 2019-12-17 DIAGNOSIS — Z8371 Family history of colonic polyps: Secondary | ICD-10-CM | POA: Diagnosis not present

## 2019-12-17 DIAGNOSIS — Z1211 Encounter for screening for malignant neoplasm of colon: Secondary | ICD-10-CM | POA: Diagnosis not present

## 2019-12-17 DIAGNOSIS — K529 Noninfective gastroenteritis and colitis, unspecified: Secondary | ICD-10-CM | POA: Diagnosis not present

## 2019-12-17 LAB — HM COLONOSCOPY

## 2020-01-11 ENCOUNTER — Encounter: Payer: Self-pay | Admitting: Family Medicine

## 2020-03-01 ENCOUNTER — Other Ambulatory Visit: Payer: Self-pay | Admitting: Family Medicine

## 2020-03-01 DIAGNOSIS — R42 Dizziness and giddiness: Secondary | ICD-10-CM

## 2020-03-01 DIAGNOSIS — G2581 Restless legs syndrome: Secondary | ICD-10-CM

## 2020-03-02 MED FILL — CITALOPRAM HBR 20 MG TABLET: 20 | 90 days supply | Qty: 90 | Fill #0

## 2020-03-02 MED FILL — PANTOPRAZOLE SOD DR 20 MG T: 20 | 90 days supply | Qty: 90 | Fill #0

## 2020-03-03 MED FILL — rOPINIRole HCL 0.5 MG TABS: 0.5 | 90 days supply | Qty: 90 | Fill #1

## 2020-03-09 DIAGNOSIS — G4733 Obstructive sleep apnea (adult) (pediatric): Secondary | ICD-10-CM | POA: Diagnosis not present

## 2020-04-03 DIAGNOSIS — K5100 Ulcerative (chronic) pancolitis without complications: Secondary | ICD-10-CM | POA: Diagnosis not present

## 2020-04-03 DIAGNOSIS — Z862 Personal history of diseases of the blood and blood-forming organs and certain disorders involving the immune mechanism: Secondary | ICD-10-CM | POA: Diagnosis not present

## 2020-04-03 DIAGNOSIS — K219 Gastro-esophageal reflux disease without esophagitis: Secondary | ICD-10-CM | POA: Diagnosis not present

## 2020-04-05 ENCOUNTER — Other Ambulatory Visit (HOSPITAL_COMMUNITY): Payer: Self-pay | Admitting: Gastroenterology

## 2020-04-05 MED FILL — BALSALAZIDE DISODIUM 750 MG: 750 | 30 days supply | Qty: 120 | Fill #0

## 2020-05-01 ENCOUNTER — Encounter: Payer: Self-pay | Admitting: Family Medicine

## 2020-05-01 DIAGNOSIS — K5190 Ulcerative colitis, unspecified, without complications: Secondary | ICD-10-CM | POA: Insufficient documentation

## 2020-05-15 ENCOUNTER — Ambulatory Visit: Payer: 59 | Attending: Internal Medicine

## 2020-05-15 DIAGNOSIS — Z23 Encounter for immunization: Secondary | ICD-10-CM

## 2020-05-15 NOTE — Progress Notes (Signed)
° °  Covid-19 Vaccination Clinic  Name:  Patrick Oconnor    MRN: [id number] DOB: 30-Aug-1965  05/15/2020  [name] was observed post Covid-19 immunization for 15 minutes without incident. He was provided with Vaccine Information Sheet and instruction to access the V-Safe system.   [name] was instructed to call 911 with any severe reactions post vaccine:  Difficulty breathing   Swelling of face and throat   A fast heartbeat   A bad rash all over body   Dizziness and weakness

## 2020-05-19 MED FILL — BALSALAZIDE DISODIUM 750 MG: 750 | 30 days supply | Qty: 120 | Fill #1

## 2020-05-29 MED FILL — rOPINIRole HCL 0.5 MG TABS: 0.5 | 90 days supply | Qty: 90 | Fill #2

## 2020-06-13 ENCOUNTER — Other Ambulatory Visit (HOSPITAL_COMMUNITY): Payer: Self-pay | Admitting: Internal Medicine

## 2020-06-13 MED FILL — SHINGRIX 50 MCG SUS: 50 | 1 days supply | Qty: 1 | Fill #0

## 2020-06-13 MED FILL — CITALOPRAM HBR 20 MG TABLET: 20 | 90 days supply | Qty: 90 | Fill #1

## 2020-06-13 MED FILL — PANTOPRAZOLE SOD DR 20 MG T: 20 | 90 days supply | Qty: 90 | Fill #1

## 2020-06-28 DIAGNOSIS — K5100 Ulcerative (chronic) pancolitis without complications: Secondary | ICD-10-CM | POA: Diagnosis not present

## 2020-06-28 DIAGNOSIS — G4733 Obstructive sleep apnea (adult) (pediatric): Secondary | ICD-10-CM | POA: Diagnosis not present

## 2020-06-28 DIAGNOSIS — K219 Gastro-esophageal reflux disease without esophagitis: Secondary | ICD-10-CM | POA: Diagnosis not present

## 2020-06-28 DIAGNOSIS — E559 Vitamin D deficiency, unspecified: Secondary | ICD-10-CM | POA: Diagnosis not present

## 2020-06-29 DIAGNOSIS — E559 Vitamin D deficiency, unspecified: Secondary | ICD-10-CM | POA: Diagnosis not present

## 2020-06-29 DIAGNOSIS — K5100 Ulcerative (chronic) pancolitis without complications: Secondary | ICD-10-CM | POA: Diagnosis not present

## 2020-06-29 LAB — BASIC METABOLIC PANEL
Creatinine: 1 (ref 0.6–1.3)
Glucose: 83
Potassium: 4.3 (ref 3.4–5.3)

## 2020-06-29 LAB — VITAMIN D 25 HYDROXY (VIT D DEFICIENCY, FRACTURES): Vit D, 25-Hydroxy: 35.6

## 2020-06-29 LAB — HEPATIC FUNCTION PANEL
ALT: 27 (ref 10–40)
AST: 38 (ref 14–40)

## 2020-06-29 LAB — CBC AND DIFFERENTIAL
Hemoglobin: 14.2 (ref 13.5–17.5)
Platelets: 146 — AB (ref 150–399)
WBC: 4.7

## 2020-07-13 ENCOUNTER — Other Ambulatory Visit: Payer: Self-pay | Admitting: Family Medicine

## 2020-07-13 MED FILL — BALSALAZIDE DISODIUM 750 MG: 750 | 30 days supply | Qty: 120 | Fill #2

## 2020-07-13 NOTE — Telephone Encounter (Signed)
Pharmacy requests refill on: Citalopram HBR 20 mg & Pantoprazole Sodium DR 20 mg   LAST REFILL: 03/02/2020 LAST OV: 11/01/2019 NEXT OV: 08/01/2020 PHARMACY: [facility]

## 2020-07-14 ENCOUNTER — Other Ambulatory Visit: Payer: Self-pay | Admitting: Family Medicine

## 2020-07-14 NOTE — Telephone Encounter (Signed)
Sent. Thanks.   

## 2020-07-16 ENCOUNTER — Other Ambulatory Visit: Payer: Self-pay | Admitting: Family Medicine

## 2020-07-16 DIAGNOSIS — D509 Iron deficiency anemia, unspecified: Secondary | ICD-10-CM

## 2020-07-16 DIAGNOSIS — Z125 Encounter for screening for malignant neoplasm of prostate: Secondary | ICD-10-CM

## 2020-07-16 DIAGNOSIS — E781 Pure hyperglyceridemia: Secondary | ICD-10-CM

## 2020-07-17 NOTE — Progress Notes (Signed)
C-Reactive Protein, Quant (06/29/2020): 3

## 2020-07-25 ENCOUNTER — Other Ambulatory Visit (INDEPENDENT_AMBULATORY_CARE_PROVIDER_SITE_OTHER): Payer: 59

## 2020-07-25 ENCOUNTER — Other Ambulatory Visit: Payer: Self-pay

## 2020-07-25 DIAGNOSIS — E781 Pure hyperglyceridemia: Secondary | ICD-10-CM | POA: Diagnosis not present

## 2020-07-25 DIAGNOSIS — D509 Iron deficiency anemia, unspecified: Secondary | ICD-10-CM

## 2020-07-25 DIAGNOSIS — Z125 Encounter for screening for malignant neoplasm of prostate: Secondary | ICD-10-CM

## 2020-07-25 LAB — IRON: Iron: 71 ug/dL (ref 42–165)

## 2020-07-25 LAB — LIPID PANEL
Cholesterol: 115 mg/dL (ref 0–200)
HDL: 33.3 mg/dL — ABNORMAL LOW (ref >39.00)
LDL Cholesterol: 69 mg/dL (ref 0–99)
NonHDL: 81.77
Total CHOL/HDL Ratio: 3
Triglycerides: 63 mg/dL (ref 0.0–149.0)
VLDL: 12.6 mg/dL (ref 0.0–40.0)

## 2020-07-25 LAB — PSA: PSA: 0.29 ng/mL (ref 0.10–4.00)

## 2020-07-27 ENCOUNTER — Encounter: Payer: 59 | Admitting: Family Medicine

## 2020-08-01 ENCOUNTER — Encounter: Payer: Self-pay | Admitting: Family Medicine

## 2020-08-01 ENCOUNTER — Other Ambulatory Visit: Payer: Self-pay | Admitting: Family Medicine

## 2020-08-01 ENCOUNTER — Other Ambulatory Visit: Payer: Self-pay

## 2020-08-01 ENCOUNTER — Ambulatory Visit (INDEPENDENT_AMBULATORY_CARE_PROVIDER_SITE_OTHER): Payer: 59 | Admitting: Family Medicine

## 2020-08-01 VITALS — BP 140/78 | HR 57 | Temp 98.4°F | Ht 74.0 in | Wt 226.0 lb

## 2020-08-01 DIAGNOSIS — Z7189 Other specified counseling: Secondary | ICD-10-CM

## 2020-08-01 DIAGNOSIS — K51919 Ulcerative colitis, unspecified with unspecified complications: Secondary | ICD-10-CM

## 2020-08-01 DIAGNOSIS — F32A Depression, unspecified: Secondary | ICD-10-CM

## 2020-08-01 DIAGNOSIS — G2581 Restless legs syndrome: Secondary | ICD-10-CM

## 2020-08-01 DIAGNOSIS — R202 Paresthesia of skin: Secondary | ICD-10-CM

## 2020-08-01 DIAGNOSIS — Z0001 Encounter for general adult medical examination with abnormal findings: Secondary | ICD-10-CM | POA: Diagnosis not present

## 2020-08-01 DIAGNOSIS — R42 Dizziness and giddiness: Secondary | ICD-10-CM

## 2020-08-01 MED ORDER — BALSALAZIDE DISODIUM 750 MG PO CAPS: ORAL_CAPSULE | ORAL | Status: DC

## 2020-08-01 MED ORDER — ROPINIROLE HCL 1 MG PO TABS: 1.0000 mg | ORAL_TABLET | Freq: Every day | ORAL | 3 refills | Status: DC

## 2020-08-01 MED FILL — rOPINIRole HCL 1 MG TABS: 1 | 90 days supply | Qty: 90 | Fill #0

## 2020-08-01 NOTE — Patient Instructions (Addendum)
Try the higher dose of requip and let me know if that isn't helping/tolerable.  Update me as needed.  Thanks for your effort.  Take care.  Glad to see you. Go to the lab on the way out.   If you have mychart we'll likely use that to update you.

## 2020-08-01 NOTE — Progress Notes (Signed)
This visit occurred during the SARS-CoV-2 public health emergency.  Safety protocols were in place, including screening questions prior to the visit, additional usage of staff PPE, and extensive cleaning of exam room while observing appropriate contact time as indicated for disinfecting solutions.  CPE- See plan.  Routine anticipatory guidance given to patient.  See health maintenance.  The possibility exists that previously documented standard health maintenance information may have been brought forward from a previous encounter into this note.  If needed, that same information has been updated to reflect the current situation based on today's encounter.    covid vaccine 2021 Tetanus 2013 Flu done at work PNA not due Shingles d/w pt.   Colonoscopy 2021 PSA 2021 Diet and exercise d/w pt.  Advance directive d/w pt. Bother and sister in law designated if patient were incapacitated.   He has noted some recent burning in his toes.  No skin breakdown.  We talked about checking a B12 level.  See notes on labs.  He has history of intentional weight loss.  I thanked him for his effort.  He is taking balsalazide for UC.  D/w pt about f/u with GI.  He had trouble with remembering the 2nd dose at night.  No ADE on med. No blood in stool.    RLS.  Requip helps.  No ADE on med.  Compliant.  He is still bothered by symptoms if he doesn't take it prior 8 PM.  We talked about trying a higher dose to see if that would provide more/better coverage for sx.    Mood d/w pt, doing well.  Holiday stressors d/w pt.  Compliant.   Citalopram has helped.    PMH and SH reviewed  Meds, vitals, and allergies reviewed.   ROS: Per HPI.  Unless specifically indicated otherwise in HPI, the patient denies:  General: fever. Eyes: acute vision changes ENT: sore throat Cardiovascular: chest pain Respiratory: SOB GI: vomiting GU: dysuria Musculoskeletal: acute back pain Derm: acute rash Neuro: acute motor  dysfunction Psych: worsening mood Endocrine: polydipsia Heme: bleeding Allergy: hayfever  GEN: nad, alert and oriented HEENT: ncat NECK: supple w/o LA CV: rrr. PULM: ctab, no inc wob ABD: soft, +bs EXT: no edema SKIN: no acute rash  Diabetic foot exam: Normal inspection No skin breakdown No calluses  Normal DP pulses Normal sensation to light touch and monofilament Nails normal

## 2020-08-02 LAB — VITAMIN B12: Vitamin B-12: 591 pg/mL (ref 211–911)

## 2020-08-02 NOTE — Assessment & Plan Note (Signed)
Mood d/w pt, doing well.  Holiday stressors d/w pt.  Compliant.   Citalopram has helped.  Continue citalopram as is.

## 2020-08-02 NOTE — Assessment & Plan Note (Signed)
Advance directive d/w pt.  Bother and sister in law designated if patient were incapacitated.   

## 2020-08-02 NOTE — Assessment & Plan Note (Signed)
Labs discussed with patient.He is still bothered by symptoms if he doesn't take it prior 8 PM.  We talked about trying a higher dose to see if that would provide more/better coverage for sx. see orders.

## 2020-08-02 NOTE — Assessment & Plan Note (Signed)
Check B12 level.  Benign exam.  Sensation intact to light touch monofilament and vibration.  No ulceration and normal dorsalis pedis pulse.

## 2020-08-02 NOTE — Assessment & Plan Note (Signed)
covid vaccine 2021 Tetanus 2013 Flu done at work PNA not due Shingles d/w pt.   Colonoscopy 2021 PSA 2021 Diet and exercise d/w pt.  Advance directive d/w pt. Bother and sister in law designated if patient were incapacitated.

## 2020-08-02 NOTE — Assessment & Plan Note (Signed)
He is taking balsalazide for UC.  D/w pt about f/u with GI.  He had trouble with remembering the 2nd dose at night.  No ADE on med. No blood in stool.   We talked about options to make it easier to remember his second dose of medication.

## 2020-08-11 MED FILL — rOPINIRole HCL 1 MG TABS: 1 | 90 days supply | Qty: 90 | Fill #0

## 2020-08-14 MED FILL — SHINGRIX 50 MCG SUS: 50 | 1 days supply | Qty: 1 | Fill #1

## 2020-08-18 DIAGNOSIS — H524 Presbyopia: Secondary | ICD-10-CM | POA: Diagnosis not present

## 2020-08-18 DIAGNOSIS — H35363 Drusen (degenerative) of macula, bilateral: Secondary | ICD-10-CM | POA: Diagnosis not present

## 2020-08-18 DIAGNOSIS — H52223 Regular astigmatism, bilateral: Secondary | ICD-10-CM | POA: Diagnosis not present

## 2020-08-18 DIAGNOSIS — H5201 Hypermetropia, right eye: Secondary | ICD-10-CM | POA: Diagnosis not present

## 2020-08-18 DIAGNOSIS — H5212 Myopia, left eye: Secondary | ICD-10-CM | POA: Diagnosis not present

## 2020-09-07 ENCOUNTER — Other Ambulatory Visit (HOSPITAL_COMMUNITY): Payer: Self-pay | Admitting: Gastroenterology

## 2020-09-07 MED FILL — PANTOPRAZOLE SOD DR 20 MG T: 20 | 90 days supply | Qty: 90 | Fill #0

## 2020-09-07 MED FILL — CITALOPRAM HBR 20 MG TABLET: 20 | 90 days supply | Qty: 90 | Fill #0

## 2020-09-07 MED FILL — BALSALAZIDE DISODIUM 750 MG: 750 | 30 days supply | Qty: 120 | Fill #0

## 2020-09-15 ENCOUNTER — Telehealth: Payer: Self-pay

## 2020-09-15 NOTE — Telephone Encounter (Addendum)
Noted. Thanks.  Please make sure he has ER cautions and to update us/seek eval if progressive sx in the meantime.  Thanks.

## 2020-09-15 NOTE — Telephone Encounter (Signed)
Pt already has appt that was oked by Dr Damita Dunnings on 09/18/20 at 4 pm.

## 2020-09-15 NOTE — Telephone Encounter (Signed)
[facility] - Client TELEPHONE ADVICE RECORD [facility] Patient Name: Patrick Oconnor Gender: Male DOB: 07-01-1966 Age: 55 Y 56 M 3 D Return Phone Number: [phone] (Primary) Address: City/State/Zip: [place] [address] Client [facility] - Client Client Site [place] Physician Renford Dills - MD Contact Type Call Who Is Calling Patient / Member / Family / Caregiver Call Type Triage / Clinical Relationship To Patient Self Return Phone Number [phone] (Primary) Chief Complaint Vomiting Reason for Call Symptomatic / Request for Sylvester states he has reoccurring diarrhea and vomiting. Caller states he has an appointment for Monday. Translation No Nurse Assessment Nurse: Ysidro Evert, RN, Levada Dy Date/Time (Eastern Time): 09/15/2020 9:50:55 AM Confirm and document reason for call. If symptomatic, describe symptoms. ---Caller states he has had vomiting and diarrhea off and on for the last two weeks. He has abdominal pain today but no vomiting or diarrhea or fever Does the patient have any new or worsening symptoms? ---Yes Will a triage be completed? ---Yes Related visit to physician within the last 2 weeks? ---No Does the PT have any chronic conditions? (i.e. diabetes, asthma, this includes High risk factors for pregnancy, etc.) ---No Is this a behavioral health or substance abuse call? ---No Guidelines Guideline Title Affirmed Question Affirmed Notes Nurse Date/Time Eilene Ghazi Time) Abdominal Pain - Male [1] MILD-MODERATE pain AND [2] comes and goes (cramps) Ysidro Evert, RN, Levada Dy 09/15/2020 9:52:49 AM Disp. Time Eilene Ghazi Time) Disposition Final User 09/15/2020 10:02:25 AM Home Care Yes Ysidro Evert, RN, Marin Shutter Disagree/Comply Comply Caller Understands Yes PreDisposition Did not know what to do PLEASE NOTE: All timestamps contained within this report are  represented as [place] Standard Time. CONFIDENTIALTY NOTICE: This fax transmission is intended only for the addressee. It contains information that is legally privileged, confidential or otherwise protected from use or disclosure. If you are not the intended recipient, you are strictly prohibited from reviewing, disclosing, copying using or disseminating any of this information or taking any action in reliance on or regarding this information. If you have received this fax in error, please notify us immediately by telephone so that we can arrange for its return to [place]. Phone: [phone], Toll-Free: [phone], Fax: [phone] Page: 2 of 2 Call Id: [id number] Care Advice Given Per Guideline HOME CARE: * You should be able to treat this at home. REASSURANCE AND EDUCATION - STOMACH PAIN: * Sometimes a stomachache signals the onset of a vomiting illness from a virus. DRINK CLEAR FLUIDS: * Drink clear fluids only (e.g., water, flat soft drinks or half-strength Gatorade). * Sip small amounts at a time, until you feel better and the pain is gone. DIET: * Slowly advance diet from clear liquids to a bland diet. * Avoid alcohol or caffeinated beverages. * Avoid greasy or fatty foods. PASS A STOOL: * Sit on the toilet and try to pass a stool (have a bowel movement). EXPECTED COURSE - ABDOMINAL PAIN: * With harmless causes, the pain is usually better or goes away within 2 hours. * With serious causes (such as appendicitis) the pain becomes constant and more severe. CALL BACK IF: * Severe pain lasts over 1 hour * You become worse CARE ADVICE given per Abdominal Pain, Male (Adult) guideline.

## 2020-09-18 ENCOUNTER — Other Ambulatory Visit: Payer: Self-pay

## 2020-09-18 ENCOUNTER — Ambulatory Visit: Payer: 59 | Admitting: Family Medicine

## 2020-09-18 ENCOUNTER — Encounter: Payer: Self-pay | Admitting: Family Medicine

## 2020-09-18 VITALS — BP 138/80 | HR 67 | Temp 97.9°F | Ht 74.0 in | Wt 230.0 lb

## 2020-09-18 DIAGNOSIS — N5089 Other specified disorders of the male genital organs: Secondary | ICD-10-CM | POA: Diagnosis not present

## 2020-09-18 NOTE — Progress Notes (Signed)
This visit occurred during the SARS-CoV-2 public health emergency.  Safety protocols were in place, including screening questions prior to the visit, additional usage of staff PPE, and extensive cleaning of exam room while observing appropriate contact time as indicated for disinfecting solutions.  He is still on 1mg  requip.  He forgot med last night and didn't do well but it usually works.   About 2 weeks ago, he was burping (no today sulfur smell at the time), had an episode of vomiting and then green diarrhea.  It resolved about 24 hours later.  Was back at work but then GI sx came back about 1 week later (that second episode was one week ago).  He thought sx were returning a few days ago, but didn't get as bad as prev.  No imodium use.  No fevers.  He feels fine now from a GI standpoint.  No known trigger other than being on a road trip, unclear if some yogurt that he ate was bad.  He had similar episodes in the past (his brother and father had similar) but he hasn't talked with GI about this.  D/w pt- would be reasonable to consider talking to GI in the future.    Now with lesion on R scrotum.  New lesion, present about 2-3 weeks.  Has fluctuated in size. Minimal drainage with compression.  Smaller than prev, when initially noted.  No painful unless he tries to express material from it.  No dysuria.   Meds, vitals, and allergies reviewed.   ROS: Per HPI unless specifically indicated in ROS section   nad ncat Abdomen soft. No hernia felt. Normal external male genitalia. Testicles not tender. No swelling erythema or rash. He does have a lump on the right side of the scrotum separate from the testicle. Overlying skin is not red or draining. It does not brightly transilluminate.

## 2020-09-18 NOTE — Patient Instructions (Signed)
We'll get the ultrasound set up.   If you notice more change in the meantime, then let me know.  Take care.  Glad to see you.

## 2020-09-20 ENCOUNTER — Ambulatory Visit
Admission: RE | Admit: 2020-09-20 | Discharge: 2020-09-20 | Disposition: A | Discharge status: Home / Self Care | Payer: 59 | Source: Ambulatory Visit | Attending: Family Medicine | Admitting: Family Medicine

## 2020-09-20 DIAGNOSIS — N5089 Other specified disorders of the male genital organs: Secondary | ICD-10-CM

## 2020-09-20 DIAGNOSIS — N433 Hydrocele, unspecified: Secondary | ICD-10-CM | POA: Diagnosis not present

## 2020-09-20 NOTE — Assessment & Plan Note (Signed)
Discussed options. Reasonable to get scrotal ultrasound. See notes on imaging. He agrees with plan.

## 2020-12-15 ENCOUNTER — Other Ambulatory Visit (HOSPITAL_COMMUNITY): Payer: Self-pay

## 2020-12-15 MED FILL — Pantoprazole Sodium EC Tab 20 MG (Base Equiv): ORAL | 90 days supply | Qty: 90 | Fill #0 | Status: AC

## 2020-12-15 MED FILL — Balsalazide Disodium Cap 750 MG: ORAL | 30 days supply | Qty: 120 | Fill #0 | Status: AC

## 2020-12-15 MED FILL — Citalopram Hydrobromide Tab 20 MG (Base Equiv): ORAL | 90 days supply | Qty: 90 | Fill #0 | Status: AC

## 2021-01-24 ENCOUNTER — Other Ambulatory Visit (HOSPITAL_COMMUNITY): Payer: Self-pay

## 2021-01-24 MED FILL — Ropinirole Hydrochloride Tab 1 MG: ORAL | 90 days supply | Qty: 90 | Fill #0 | Status: AC

## 2021-03-09 ENCOUNTER — Other Ambulatory Visit (HOSPITAL_COMMUNITY): Payer: Self-pay

## 2021-03-12 ENCOUNTER — Other Ambulatory Visit (HOSPITAL_COMMUNITY): Payer: Self-pay

## 2021-03-12 ENCOUNTER — Other Ambulatory Visit: Payer: Self-pay

## 2021-03-12 ENCOUNTER — Other Ambulatory Visit: Payer: Self-pay | Admitting: Family Medicine

## 2021-03-13 ENCOUNTER — Other Ambulatory Visit: Payer: Self-pay | Admitting: Family Medicine

## 2021-03-13 ENCOUNTER — Other Ambulatory Visit (HOSPITAL_COMMUNITY): Payer: Self-pay

## 2021-03-13 MED ORDER — PANTOPRAZOLE SODIUM 20 MG PO TBEC
20.0000 mg | DELAYED_RELEASE_TABLET | Freq: Every day | ORAL | 1 refills | Status: DC
  Filled 2021-03-13: qty 47, 47d supply, fill #0
  Filled 2021-03-13: qty 43, 43d supply, fill #0
  Filled 2021-06-25: qty 90, 90d supply, fill #1

## 2021-03-13 MED ORDER — CITALOPRAM HYDROBROMIDE 20 MG PO TABS
20.0000 mg | ORAL_TABLET | Freq: Every day | ORAL | 1 refills | Status: DC
  Filled 2021-03-13: qty 90, 90d supply, fill #0
  Filled 2021-06-25: qty 90, 90d supply, fill #1

## 2021-03-13 MED ORDER — BALSALAZIDE DISODIUM 750 MG PO CAPS
1500.0000 mg | ORAL_CAPSULE | Freq: Two times a day (BID) | ORAL | 1 refills | Status: DC
  Filled 2021-03-13: qty 120, 30d supply, fill #0
  Filled 2021-06-25: qty 120, 30d supply, fill #1

## 2021-03-15 ENCOUNTER — Other Ambulatory Visit (HOSPITAL_COMMUNITY): Payer: Self-pay

## 2021-04-06 ENCOUNTER — Other Ambulatory Visit (HOSPITAL_COMMUNITY): Payer: Self-pay

## 2021-04-06 MED FILL — Ropinirole Hydrochloride Tab 1 MG: ORAL | 90 days supply | Qty: 90 | Fill #1 | Status: CN

## 2021-04-13 ENCOUNTER — Other Ambulatory Visit (HOSPITAL_COMMUNITY): Payer: Self-pay

## 2021-04-17 ENCOUNTER — Other Ambulatory Visit (HOSPITAL_COMMUNITY): Payer: Self-pay

## 2021-04-23 DIAGNOSIS — Z23 Encounter for immunization: Secondary | ICD-10-CM | POA: Diagnosis not present

## 2021-04-25 ENCOUNTER — Other Ambulatory Visit (HOSPITAL_COMMUNITY): Payer: Self-pay

## 2021-04-26 ENCOUNTER — Other Ambulatory Visit (HOSPITAL_COMMUNITY): Payer: Self-pay

## 2021-04-26 MED FILL — Ropinirole Hydrochloride Tab 1 MG: ORAL | 90 days supply | Qty: 90 | Fill #1 | Status: AC

## 2021-05-09 ENCOUNTER — Other Ambulatory Visit: Payer: Self-pay

## 2021-05-09 ENCOUNTER — Ambulatory Visit
Admission: RE | Admit: 2021-05-09 | Discharge: 2021-05-09 | Disposition: A | Discharge status: Home / Self Care | Payer: 59 | Source: Ambulatory Visit | Attending: Physician Assistant | Admitting: Physician Assistant

## 2021-05-09 ENCOUNTER — Telehealth: Payer: Self-pay | Admitting: Family Medicine

## 2021-05-09 VITALS — BP 153/90 | HR 59 | Temp 98.1°F | Resp 18

## 2021-05-09 DIAGNOSIS — H9312 Tinnitus, left ear: Secondary | ICD-10-CM

## 2021-05-09 DIAGNOSIS — H938X2 Other specified disorders of left ear: Secondary | ICD-10-CM

## 2021-05-09 DIAGNOSIS — H9192 Unspecified hearing loss, left ear: Secondary | ICD-10-CM | POA: Diagnosis not present

## 2021-05-09 DIAGNOSIS — H6992 Unspecified Eustachian tube disorder, left ear: Secondary | ICD-10-CM | POA: Diagnosis not present

## 2021-05-09 MED ORDER — FLUTICASONE PROPIONATE 50 MCG/ACT NA SUSP
1.0000 | Freq: Every day | NASAL | 0 refills | Status: DC
  Filled 2021-05-09: qty 16, 30d supply, fill #0

## 2021-05-09 MED ORDER — PREDNISONE 10 MG PO TABS
20.0000 mg | ORAL_TABLET | Freq: Every day | ORAL | 0 refills | Status: AC
  Filled 2021-05-09: qty 8, 4d supply, fill #0

## 2021-05-09 NOTE — Discharge Instructions (Addendum)
I believe that your symptoms are related to eustachian tube.  Please continue your allergy medication and add Flonase to your regimen.  We have also called in prednisone 20 mg to be used for 4 days.  Do not take NSAIDs including aspirin, ibuprofen/Advil, naproxen/Aleve with this medication as it can cause stomach bleeding.  I do recommend a follow-up with ENT if symptoms or not quickly improving.  If you have any worsening symptoms please return for reevaluation.

## 2021-05-09 NOTE — Telephone Encounter (Signed)
Noted.  I am out of clinic today.  Thanks.

## 2021-05-09 NOTE — ED Provider Notes (Signed)
[facility] Patrick Oconnor    CSN: [id number] Arrival date & time: 05/09/21  1321      History   Chief Complaint Chief Complaint  Patient presents with   Appointment   Ear Fullness    HPI Patrick Oconnor is a 55 y.o. male.   Patient presents today with a 10-day story of increasing fullness in his left ear.  He reports associated change in tinnitus.  He has a history of pulsatile tinnitus following viral infection several years ago for which he was followed by ENT.  Reports he learned to live with this but recently this has become a different sensation and is no longer pulsatile pulm or the white noise/buzzing.  He does have a history of allergies and has been taking antihistamines as prescribed.  Denies any recent illness or additional symptoms including increased congestion, cough, headache, fever.  He does report muffled hearing as result of fullness.  He denies any recent antibiotic use.  He does not take NSAIDs on a regular basis.  He is not currently followed by ENT.     Past Medical History:  Diagnosis Date   Anemia    Asthma, exercise induced    Degenerative disc disease, lumbar    Depression    Environmental allergies    GERD (gastroesophageal reflux disease)    History of bronchitis    Hypertension    IBS (irritable bowel syndrome)    Pneumonia    hx of   PONV (postoperative nausea and vomiting)    Sleep apnea 04/2007   Started CPAP; wears CPAP nightly   Ulcerative colitis [facility])     Patient Active Problem List   Diagnosis Date Noted   Scrotal mass 09/20/2020   Ulcerative colitis ([place])    Pulsatile tinnitus 07/29/2018   Lightheaded 07/29/2018   Advance care planning 08/08/2017   Paresthesia 06/29/2016   Hypertriglyceridemia 06/28/2016   Urinary hesitancy 06/28/2016   Headache 04/27/2015   Anemia, iron deficiency 02/28/2015   Arthritis 02/21/2015   Constipation 07/04/2014   Encounter for general adult medical examination with abnormal findings 01/07/2014    FH: prostate cancer 02/09/2013   Fatigue 02/09/2013   Low back pain potentially associated with radiculopathy 06/23/2012   BENIGN POSITIONAL VERTIGO, HX OF 05/16/2009   HYPERTENSION, MILD 07/26/2008   RESTLESS LEG SYNDROME, MILD 09/29/2007   Sleep apnea 03/17/2007   GLUCOSE INTOLERANCE 10/09/2006   Depression 10/09/2006   ASTHMA, INTERMITTENT 10/09/2006   GASTROESOPHAGEAL REFLUX, NO ESOPHAGITIS 10/09/2006   IRRITABLE BOWEL SYNDROME 10/09/2006    Past Surgical History:  Procedure Laterality Date   ACROMIO-CLAVICULAR JOINT REPAIR Right 04/15/2013   Procedure: RIGHT SHOULDER ACROMIO-CLAVICULAR RECONSTRUCTION WITH ALLOGRAFT   ;  Surgeon: Marin Shutter, MD;  Location: [place];  Service: Orthopedics;  Laterality: Right;   ACROMIO-CLAVICULAR JOINT REPAIR Right 06/03/2013   Procedure: ACROMIO-CLAVICULAR JOINT REPAIR;  Surgeon: Marin Shutter, MD;  Location: [place];  Service: Orthopedics;  Laterality: Right;   ACROMIO-CLAVICULAR JOINT REPAIR Right 10/19/2015   Procedure: OPEN RIGHT SHOULDER ACROMIO-CLAVICULAR JOINT RECONSTRUCTION ;  Surgeon: Justice Britain, MD;  Location: [place];  Service: Orthopedics;  Laterality: Right;   COLONOSCOPY  08/12/2005   "multiple"   ESOPHAGOGASTRODUODENOSCOPY  3/09   normal; multiple   ESOPHAGOGASTRODUODENOSCOPY (EGD) WITH PROPOFOL N/A 01/22/2016   Procedure: ESOPHAGOGASTRODUODENOSCOPY (EGD) WITH PROPOFOL;  Surgeon: Laurence Spates, MD;  Location: [facility];  Service: Endoscopy;  Laterality: N/A;   INCISION AND DRAINAGE Right 06/03/2013   Procedure: INCISION AND DRAINAGE RIGHT SHOULDER;  Surgeon: Marin Shutter, MD;  Location: [place];  Service: Orthopedics;  Laterality: Right;   pilonidal cystectomy  42003   x 2   PYLOROPLASTY  infant   pyloric stenosis   SEPTOPLASTY     SHOULDER OPEN ROTATOR CUFF REPAIR Right 10/19/2015   Procedure: OPEN RIGHT SHOULDER ROTATOR CUFF REPAIR ;  Surgeon: Justice Britain, MD;  Location: [facility];  Service: Orthopedics;  Laterality: Right;    thrombosed external hemorrhoid  9/08   incised   TONSILLECTOMY AND ADENOIDECTOMY         Home Medications    Prior to Admission medications   Medication Sig Start Date End Date Taking? Authorizing Provider  fluticasone (FLONASE) 50 MCG/ACT nasal spray Place 1 spray into both nostrils daily. 05/09/21  Yes Rocco Kerkhoff K, PA-C  predniSONE (DELTASONE) 10 MG tablet Take 2 tablets (20 mg total) by mouth daily for 4 days. 05/09/21 05/13/21 Yes Alexzandria Massman K, PA-C  albuterol (PROVENTIL,VENTOLIN) 90 MCG/ACT inhaler Inhale 2 puffs into the lungs every 6 (six) hours as needed for shortness of breath.    [provider]  balsalazide (COLAZAL) 750 MG capsule 2 capsules twice a day 08/01/20   Tonia Ghent, MD  balsalazide (COLAZAL) 750 MG capsule TAKE 2 CAPSULES BY MOUTH TWO TIMES DAILY 04/05/20 04/05/21  Otis Brace, MD  balsalazide (COLAZAL) 750 MG capsule Take 2 capsules (1,500 mg total) by mouth 2 (two) times daily. 03/13/21     bisacodyl (DULCOLAX) 5 MG EC tablet Take 2 tablets at 12:00 noon and 2 Tablets 12/09/19   [provider]  Cholecalciferol (VITAMIN D) 50 MCG (2000 UT) CAPS Take 1 capsule by mouth daily.    [provider]  citalopram (CELEXA) 20 MG tablet Take 1 tablet (20 mg total) by mouth daily. 03/13/21   Tonia Ghent, MD  ferrous sulfate 325 (65 FE) MG tablet Take 1 tablet (325 mg total) by mouth daily. 11/01/19   Tonia Ghent, MD  loratadine (CLARITIN) 10 MG tablet Take 10 mg by mouth daily.    [provider]  pantoprazole (PROTONIX) 20 MG tablet Take 1 tablet (20 mg total) by mouth daily. 03/13/21   Tonia Ghent, MD  polyethylene glycol powder (GLYCOLAX/MIRALAX) powder Take 17 g by mouth 2 (two) times daily as needed (For constipation.). 08/07/17   Tonia Ghent, MD  Probiotic Product (PROBIOTIC DAILY PO) Take 1 capsule by mouth daily as needed (For digestive health.).     [provider]  rOPINIRole (REQUIP) 1 MG tablet  Take 1 tablet (1 mg total) by mouth at bedtime. 08/01/20   Tonia Ghent, MD  sennosides-docusate sodium (SENOKOT-S) 8.6-50 MG tablet Take 1-2 tablets by mouth daily as needed for constipation. 08/07/17   Tonia Ghent, MD  Tetrahydroz-Dextran-PEG-Povid Karma Lew ADVANCED RELIEF OP) Apply 1-2 drops to eye as needed (For eye irritation.).     [provider]  Zoster Vaccine Adjuvanted [facility]) injection TO BE ADMINISTERED BY PHARMACIST 06/13/20 06/13/21  Carlyle Basques, MD    Family History Family History  Problem Relation Age of Onset   Cancer Father 68       prostate Stage 4   Prostate cancer Father    Cancer Maternal Grandmother        breast   Cancer Mother    Cancer Paternal Grandmother        Colon   Colon cancer Paternal Grandmother     Social History Social History   Tobacco Use  Smoking status: Never   Smokeless tobacco: Never  Vaping Use   Vaping Use: Never used  Substance Use Topics   Alcohol use: Yes    Alcohol/week: 0.3 standard drinks    Comment: rarely   Drug use: No     Allergies   Patient has no known allergies.   Review of Systems Review of Systems  Constitutional:  Negative for activity change, appetite change, fatigue and fever.  HENT:  Positive for congestion, hearing loss and tinnitus. Negative for ear discharge, ear pain, sinus pressure, sneezing and sore throat.   Gastrointestinal:  Negative for abdominal pain, diarrhea, nausea and vomiting.  Neurological:  Negative for dizziness, light-headedness and headaches.    Physical Exam Triage Vital Signs ED Triage Vitals  Enc Vitals Group     BP 05/09/21 1405 (!) 153/90     Pulse Rate 05/09/21 1405 (!) 59     Resp 05/09/21 1405 18     Temp 05/09/21 1405 98.1 F (36.7 C)     Temp src --      SpO2 05/09/21 1405 96 %     Weight --      Height --      Head Circumference --      Peak Flow --      Pain Score 05/09/21 1418 0     Pain Loc --      Pain Edu? --      Excl. in [place]?  --    No data found.  Updated Vital Signs BP (!) 153/90 (BP Location: Left Arm)   Pulse (!) 59   Temp 98.1 F (36.7 C)   Resp 18   SpO2 96%   Visual Acuity Right Eye Distance:   Left Eye Distance:   Bilateral Distance:    Right Eye Near:   Left Eye Near:    Bilateral Near:     Physical Exam Vitals reviewed.  Constitutional:      General: He is awake.     Appearance: Normal appearance. He is well-developed. He is not ill-appearing.     Comments: Very pleasant male appears stated age in no acute distress sitting comfortably in exam room  HENT:     Head: Normocephalic and atraumatic.     Right Ear: Tympanic membrane, ear canal and external ear normal. Tympanic membrane is not erythematous or bulging.     Left Ear: Ear canal and external ear normal. A middle ear effusion is present. Tympanic membrane is injected.     Nose: Nose normal.     Mouth/Throat:     Pharynx: Uvula midline. No oropharyngeal exudate or posterior oropharyngeal erythema.  Cardiovascular:     Rate and Rhythm: Normal rate and regular rhythm.     Heart sounds: Normal heart sounds, S1 normal and S2 normal. No murmur heard. Pulmonary:     Effort: Pulmonary effort is normal. No accessory muscle usage or respiratory distress.     Breath sounds: Normal breath sounds. No stridor. No wheezing, rhonchi or rales.     Comments: Clear to auscultation bilaterally Abdominal:     General: Bowel sounds are normal.     Palpations: Abdomen is soft.     Tenderness: There is no abdominal tenderness.  Lymphadenopathy:     Head:     Right side of head: No submental, submandibular or tonsillar adenopathy.     Left side of head: No submental, submandibular or tonsillar adenopathy.     Cervical: No cervical adenopathy.  Neurological:  Mental Status: He is alert.  Psychiatric:        Behavior: Behavior is cooperative.     UC Treatments / Results  Labs (all labs ordered are listed, but only abnormal results are  displayed) Labs Reviewed - No data to display  EKG   Radiology No results found.  Procedures Procedures (including critical care time)  Medications Ordered in UC Medications - No data to display  Initial Impression / Assessment and Plan / UC Course  I have reviewed the triage vital signs and the nursing notes.  Pertinent labs & imaging results that were available during my care of the patient were reviewed by me and considered in my medical decision making (see chart for details).      Symptoms consistent with eustachian tube dysfunction.  No evidence of acute infection that warrant initiation of antibiotics today.  Patient was started on Flonase but we will also prescribe a low-dose prednisone (20 mg for 4 days).  He was instructed to avoid NSAIDs with this medication due to risk of GI bleeding.  Discussed that if symptoms are improving he should follow-up with ENT; he has previously been evaluated by ENT and will call them to schedule an appointment.  Discussed alarm symptoms that warrant emergent evaluation including fever, decreased hearing, increased pain.  Strict return precautions given to which he expressed understanding.  Final Clinical Impressions(s) / UC Diagnoses   Final diagnoses:  Sensation of fullness in left ear  Tinnitus of left ear  Decreased hearing of left ear  Disorder of Eustachian tube, left     Discharge Instructions      I believe that your symptoms are related to eustachian tube.  Please continue your allergy medication and add Flonase to your regimen.  We have also called in prednisone 20 mg to be used for 4 days.  Do not take NSAIDs including aspirin, ibuprofen/Advil, naproxen/Aleve with this medication as it can cause stomach bleeding.  I do recommend a follow-up with ENT if symptoms or not quickly improving.  If you have any worsening symptoms please return for reevaluation.     ED Prescriptions     Medication Sig Dispense Auth. Provider    predniSONE (DELTASONE) 10 MG tablet Take 2 tablets (20 mg total) by mouth daily for 4 days. 8 tablet Justine Cossin K, PA-C   fluticasone (FLONASE) 50 MCG/ACT nasal spray Place 1 spray into both nostrils daily. 16 g Talina Pleitez K, PA-C      PDMP not reviewed this encounter.   Terrilee Croak, PA-C 05/09/21 1445

## 2021-05-09 NOTE — Telephone Encounter (Signed)
I am including this copy of access nurse note since larger print even though I know Dr Damita Dunnings has already seen note and commented.

## 2021-05-09 NOTE — Telephone Encounter (Signed)
I spoke with pt; pt has had lightheadedness, fullness in lt ear and diminished hearing in the lt ear. Pt also does not have ringing in ear but a white noise sound. Pt will be flying in 2 wks and wants this gone. Pt has some head congestion and allergies that pt usually has. I scheduled pt for appt 05/09/21 at 1:30 at [place] in . Sending note to Dr Damita Dunnings as Juluis Rainier.

## 2021-05-09 NOTE — ED Triage Notes (Signed)
Patient reports ear fullness for 10 days, no pain.  This involves the left ear.  Reports tinnitus, chronic.  However, in the last 10 days this has even changed.

## 2021-05-11 ENCOUNTER — Encounter: Payer: Self-pay | Admitting: Family Medicine

## 2021-05-13 ENCOUNTER — Other Ambulatory Visit: Payer: Self-pay | Admitting: Family Medicine

## 2021-05-13 DIAGNOSIS — R42 Dizziness and giddiness: Secondary | ICD-10-CM

## 2021-05-14 ENCOUNTER — Telehealth: Payer: Self-pay | Admitting: *Deleted

## 2021-05-14 NOTE — Telephone Encounter (Signed)
-----   Message from Tonia Ghent, MD sent at 05/13/2021  6:54 PM EDT ----- Regarding: ENT referral When can patient see ENT?  He is leaving the country on 05/21/2021 and would like to be evaluated/treated prior to that.  Thanks.  Brigitte Pulse

## 2021-05-14 NOTE — Telephone Encounter (Signed)
We are working on this.   Referral was just placed 05/13/21 Not marked as Urgent No location given.  I have changed the urgency of the referral in hopes of getting patient scheduled prior to next week.  Notes in referral for [place] or [place]  - First available appt.   Likelihood of getting patient scheduled before the 10th is very slim but we will try.   Please in the future make sure that location is in the referral as well as correct urgency so that the referral can be process appropriately.   Thanks!

## 2021-05-14 NOTE — Telephone Encounter (Signed)
Thanks Danae Chen for your help!

## 2021-05-14 NOTE — Telephone Encounter (Signed)
I thank all involved. 

## 2021-06-25 ENCOUNTER — Other Ambulatory Visit (HOSPITAL_COMMUNITY): Payer: Self-pay

## 2021-06-25 ENCOUNTER — Other Ambulatory Visit: Payer: Self-pay | Admitting: Family Medicine

## 2021-06-25 DIAGNOSIS — G2581 Restless legs syndrome: Secondary | ICD-10-CM

## 2021-06-25 DIAGNOSIS — R42 Dizziness and giddiness: Secondary | ICD-10-CM

## 2021-06-25 NOTE — Telephone Encounter (Signed)
Pt called wanting to discuss the refill. He wanted the refill direction to be wrote for more because he is having to take it more often. He also takes it now when he naps

## 2021-06-26 ENCOUNTER — Other Ambulatory Visit: Payer: Self-pay | Admitting: Family Medicine

## 2021-06-26 ENCOUNTER — Other Ambulatory Visit (HOSPITAL_COMMUNITY): Payer: Self-pay

## 2021-06-26 DIAGNOSIS — R42 Dizziness and giddiness: Secondary | ICD-10-CM

## 2021-06-26 DIAGNOSIS — G2581 Restless legs syndrome: Secondary | ICD-10-CM

## 2021-06-27 ENCOUNTER — Other Ambulatory Visit (HOSPITAL_COMMUNITY): Payer: Self-pay

## 2021-06-27 MED ORDER — ROPINIROLE HCL 1 MG PO TABS
1.0000 mg | ORAL_TABLET | Freq: Every day | ORAL | 3 refills | Status: DC | PRN
Start: 2021-06-27 — End: 2022-05-13
  Filled 2021-06-27 – 2021-06-29 (×2): qty 180, 90d supply, fill #0
  Filled 2021-10-02: qty 180, 90d supply, fill #1
  Filled 2022-01-04: qty 180, 90d supply, fill #2
  Filled 2022-03-25: qty 180, 90d supply, fill #3

## 2021-06-27 NOTE — Telephone Encounter (Signed)
Sent rx with sig updated.  Thanks.

## 2021-06-27 NOTE — Telephone Encounter (Signed)
Pt returning your call

## 2021-06-27 NOTE — Telephone Encounter (Signed)
Patient called and stated he is having to use more ropinirole for his restless leg syndrome lately and would like rx to be written for more. He can not get his rx until 07/21/21 right now cause sometimes he has to take BID if he takes a nap. He has his CPE on 08/14/2021 and will discuss more then if needed but needs to be able to get his rx before 07/21/21 as he is almost out.

## 2021-06-27 NOTE — Telephone Encounter (Signed)
LMTCB to discuss how often he is taking med.

## 2021-06-29 ENCOUNTER — Other Ambulatory Visit (HOSPITAL_COMMUNITY): Payer: Self-pay

## 2021-07-19 ENCOUNTER — Other Ambulatory Visit (HOSPITAL_COMMUNITY): Payer: Self-pay

## 2021-07-19 DIAGNOSIS — M542 Cervicalgia: Secondary | ICD-10-CM | POA: Diagnosis not present

## 2021-07-19 MED ORDER — METHYLPREDNISOLONE 4 MG PO TBPK
ORAL_TABLET | ORAL | 0 refills | Status: DC
  Filled 2021-07-19: qty 21, 6d supply, fill #0

## 2021-07-26 ENCOUNTER — Other Ambulatory Visit: Payer: Self-pay | Admitting: Neurological Surgery

## 2021-07-26 ENCOUNTER — Other Ambulatory Visit (HOSPITAL_COMMUNITY): Payer: Self-pay | Admitting: Neurological Surgery

## 2021-07-26 DIAGNOSIS — M542 Cervicalgia: Secondary | ICD-10-CM | POA: Diagnosis not present

## 2021-07-30 ENCOUNTER — Other Ambulatory Visit: Payer: Self-pay | Admitting: Family Medicine

## 2021-07-30 DIAGNOSIS — D509 Iron deficiency anemia, unspecified: Secondary | ICD-10-CM

## 2021-07-30 DIAGNOSIS — E781 Pure hyperglyceridemia: Secondary | ICD-10-CM

## 2021-07-30 DIAGNOSIS — Z125 Encounter for screening for malignant neoplasm of prostate: Secondary | ICD-10-CM

## 2021-07-30 DIAGNOSIS — G2581 Restless legs syndrome: Secondary | ICD-10-CM

## 2021-08-01 ENCOUNTER — Other Ambulatory Visit: Payer: Self-pay

## 2021-08-01 ENCOUNTER — Ambulatory Visit (HOSPITAL_COMMUNITY)
Admission: RE | Admit: 2021-08-01 | Discharge: 2021-08-01 | Disposition: A | Discharge status: Home / Self Care | Payer: 59 | Source: Ambulatory Visit | Attending: Neurological Surgery | Admitting: Neurological Surgery

## 2021-08-01 DIAGNOSIS — M542 Cervicalgia: Secondary | ICD-10-CM | POA: Diagnosis not present

## 2021-08-01 DIAGNOSIS — M47812 Spondylosis without myelopathy or radiculopathy, cervical region: Secondary | ICD-10-CM | POA: Diagnosis not present

## 2021-08-01 DIAGNOSIS — M4802 Spinal stenosis, cervical region: Secondary | ICD-10-CM | POA: Diagnosis not present

## 2021-08-01 DIAGNOSIS — M4312 Spondylolisthesis, cervical region: Secondary | ICD-10-CM | POA: Diagnosis not present

## 2021-08-01 DIAGNOSIS — M2578 Osteophyte, vertebrae: Secondary | ICD-10-CM | POA: Diagnosis not present

## 2021-08-07 ENCOUNTER — Other Ambulatory Visit: Payer: 59

## 2021-08-14 ENCOUNTER — Encounter: Payer: 59 | Admitting: Family Medicine

## 2021-08-16 ENCOUNTER — Other Ambulatory Visit (HOSPITAL_COMMUNITY): Payer: Self-pay

## 2021-08-16 ENCOUNTER — Other Ambulatory Visit: Payer: Self-pay

## 2021-08-16 ENCOUNTER — Encounter: Payer: Self-pay | Admitting: Family Medicine

## 2021-08-16 ENCOUNTER — Ambulatory Visit (INDEPENDENT_AMBULATORY_CARE_PROVIDER_SITE_OTHER): Payer: 59 | Admitting: Family Medicine

## 2021-08-16 VITALS — BP 122/84 | HR 64 | Ht 72.0 in | Wt 245.6 lb

## 2021-08-16 DIAGNOSIS — Z7189 Other specified counseling: Secondary | ICD-10-CM

## 2021-08-16 DIAGNOSIS — G2581 Restless legs syndrome: Secondary | ICD-10-CM

## 2021-08-16 DIAGNOSIS — J45909 Unspecified asthma, uncomplicated: Secondary | ICD-10-CM

## 2021-08-16 DIAGNOSIS — Z0001 Encounter for general adult medical examination with abnormal findings: Secondary | ICD-10-CM | POA: Diagnosis not present

## 2021-08-16 DIAGNOSIS — Z125 Encounter for screening for malignant neoplasm of prostate: Secondary | ICD-10-CM

## 2021-08-16 DIAGNOSIS — E781 Pure hyperglyceridemia: Secondary | ICD-10-CM

## 2021-08-16 DIAGNOSIS — F32A Depression, unspecified: Secondary | ICD-10-CM

## 2021-08-16 DIAGNOSIS — D509 Iron deficiency anemia, unspecified: Secondary | ICD-10-CM

## 2021-08-16 DIAGNOSIS — K51919 Ulcerative colitis, unspecified with unspecified complications: Secondary | ICD-10-CM

## 2021-08-16 DIAGNOSIS — M542 Cervicalgia: Secondary | ICD-10-CM

## 2021-08-16 MED ORDER — ALBUTEROL SULFATE HFA 108 (90 BASE) MCG/ACT IN AERS
1.0000 | INHALATION_SPRAY | Freq: Four times a day (QID) | RESPIRATORY_TRACT | 2 refills | Status: AC | PRN
  Filled 2021-08-16 – 2021-10-02 (×2): qty 8.5, 25d supply, fill #0

## 2021-08-16 NOTE — Patient Instructions (Addendum)
Please check with GI about med options.   Take care.  Glad to see you. Go to the lab on the way out.   If you have mychart we'll likely use that to update you.

## 2021-08-16 NOTE — Progress Notes (Signed)
This visit occurred during the SARS-CoV-2 public health emergency.  Safety protocols were in place, including screening questions prior to the visit, additional usage of staff PPE, and extensive cleaning of exam room while observing appropriate contact time as indicated for disinfecting solutions.  CPE- See plan.  Routine anticipatory guidance given to patient.  See health maintenance.  The possibility exists that previously documented standard health maintenance information may have been brought forward from a previous encounter into this note.  If needed, that same information has been updated to reflect the current situation based on today's encounter.    covid vaccine 2021 Tetanus 2013 Flu done at work PNA not due Shingles 2022 Colonoscopy 2021 PSA pending Diet and exercise d/w pt.    Advance directive d/w pt.  Bother and sister in law designated if patient were incapacitated.    He isn't completely fasting for labs, noted and discussed.    He wanted to try SABA prior to exercise.  He only noted wheeze with sig exertion.     He is taking balsalazide for UC.  D/w pt about f/u with GI.  He was asking about daily and not BID med- I need GI input.      RLS.  Requip helps.  No ADE on med.  Compliant.  He is still bothered by symptoms if he doesn't take it prior 8 PM.  Labs pending. Taking 1-2 mg, usually only rarely needing 2mg .    Mood d/w pt, doing well.  Stressors d/w pt.  Compliant.   Citalopram has helped.    Neck d/w pt.  Was walking in parking deck and hit his head on a concrete walkway overhead. He had episodic arm numbness.  Prev imaging per outside MD with IMPRESSION: 1. Multilevel cervical spondylosis with resultant mild to moderate diffuse spinal stenosis at C4-5 through C6-7. No frank cord impingement. 2. Multifactorial degenerative changes with resultant multilevel foraminal narrowing. Notable findings include moderate left C3 foraminal stenosis, with moderate to severe  bilateral C4 through C8 foraminal narrowing as above. He is going to f/u with Dr. Ronnald Ramp about the MRI.   PMH and SH reviewed  Meds, vitals, and allergies reviewed.   ROS: Per HPI.  Unless specifically indicated otherwise in HPI, the patient denies:  General: fever. Eyes: acute vision changes ENT: sore throat Cardiovascular: chest pain Respiratory: SOB GI: vomiting GU: dysuria Musculoskeletal: acute back pain Derm: acute rash Neuro: acute motor dysfunction Psych: worsening mood Endocrine: polydipsia Heme: bleeding Allergy: hayfever  GEN: nad, alert and oriented HEENT: ncat NECK: supple w/o LA CV: rrr. PULM: ctab, no inc wob ABD: soft, +bs EXT: no edema SKIN: Well-perfused.

## 2021-08-17 LAB — CBC WITH DIFFERENTIAL/PLATELET
Basophils Absolute: 0 10*3/uL (ref 0.0–0.1)
Basophils Relative: 0.5 % (ref 0.0–3.0)
Eosinophils Absolute: 0.3 10*3/uL (ref 0.0–0.7)
Eosinophils Relative: 3.6 % (ref 0.0–5.0)
HCT: 41.4 % (ref 39.0–52.0)
Hemoglobin: 14.1 g/dL (ref 13.0–17.0)
Lymphocytes Relative: 23.6 % (ref 12.0–46.0)
Lymphs Abs: 1.7 10*3/uL (ref 0.7–4.0)
MCHC: 34 g/dL (ref 30.0–36.0)
MCV: 86.9 fl (ref 78.0–100.0)
Monocytes Absolute: 0.5 10*3/uL (ref 0.1–1.0)
Monocytes Relative: 7.5 % (ref 3.0–12.0)
Neutro Abs: 4.7 10*3/uL (ref 1.4–7.7)
Neutrophils Relative %: 64.8 % (ref 43.0–77.0)
Platelets: 204 10*3/uL (ref 150.0–400.0)
RBC: 4.76 Mil/uL (ref 4.22–5.81)
RDW: 13.9 % (ref 11.5–15.5)
WBC: 7.2 10*3/uL (ref 4.0–10.5)

## 2021-08-17 LAB — LIPID PANEL
Cholesterol: 167 mg/dL (ref 0–200)
HDL: 30.7 mg/dL — ABNORMAL LOW (ref >39.00)
LDL Cholesterol: 99 mg/dL (ref 0–99)
NonHDL: 136.55
Total CHOL/HDL Ratio: 5
Triglycerides: 189 mg/dL — ABNORMAL HIGH (ref 0.0–149.0)
VLDL: 37.8 mg/dL (ref 0.0–40.0)

## 2021-08-17 LAB — COMPREHENSIVE METABOLIC PANEL
ALT: 22 U/L (ref 0–53)
AST: 24 U/L (ref 0–37)
Albumin: 4.6 g/dL (ref 3.5–5.2)
Alkaline Phosphatase: 76 U/L (ref 39–117)
BUN: 22 mg/dL (ref 6–23)
CO2: 32 mEq/L (ref 19–32)
Calcium: 9 mg/dL (ref 8.4–10.5)
Chloride: 101 mEq/L (ref 96–112)
Creatinine, Ser: 0.89 mg/dL (ref 0.40–1.50)
GFR: 94.96 mL/min (ref >60.00)
Glucose, Bld: 82 mg/dL (ref 70–99)
Potassium: 4 mEq/L (ref 3.5–5.1)
Sodium: 141 mEq/L (ref 135–145)
Total Bilirubin: 0.6 mg/dL (ref 0.2–1.2)
Total Protein: 7 g/dL (ref 6.0–8.3)

## 2021-08-17 LAB — VITAMIN D 25 HYDROXY (VIT D DEFICIENCY, FRACTURES): VITD: 25.67 ng/mL — ABNORMAL LOW (ref 30.00–100.00)

## 2021-08-17 LAB — TSH: TSH: 1.28 u[IU]/mL (ref 0.35–5.50)

## 2021-08-17 LAB — PSA: PSA: 0.45 ng/mL (ref 0.10–4.00)

## 2021-08-17 LAB — IRON: Iron: 67 ug/dL (ref 42–165)

## 2021-08-19 DIAGNOSIS — M542 Cervicalgia: Secondary | ICD-10-CM | POA: Insufficient documentation

## 2021-08-19 NOTE — Assessment & Plan Note (Signed)
He is going to f/u with Dr. Ronnald Ramp about the MRI and his symptoms.  He will update me as needed.

## 2021-08-19 NOTE — Assessment & Plan Note (Signed)
He only notes wheezing with significant exertion and he is going to try albuterol prior to exercise and update me as needed.

## 2021-08-19 NOTE — Assessment & Plan Note (Signed)
Requip helps.  Would continue as is for now He is still bothered by symptoms if he doesn't take it prior 8 PM.  Labs pending. Taking 1-2 mg, usually only rarely needing 2mg .

## 2021-08-19 NOTE — Assessment & Plan Note (Signed)
He is taking balsalazide for UC.  D/w pt about f/u with GI.  He was asking about daily and not BID med- I need GI input, d/w pt.

## 2021-08-19 NOTE — Assessment & Plan Note (Signed)
covid vaccine 2021 Tetanus 2013 Flu done at work PNA not due Shingles 2022 Colonoscopy 2021 PSA pending Diet and exercise d/w pt.    Advance directive d/w pt.  Bother and sister in law designated if patient were incapacitated.

## 2021-08-19 NOTE — Assessment & Plan Note (Signed)
Would continue citalopram as that is helped.

## 2021-08-19 NOTE — Assessment & Plan Note (Signed)
Advance directive d/w pt.  Bother and sister in law designated if patient were incapacitated.   

## 2021-08-20 ENCOUNTER — Other Ambulatory Visit: Payer: Self-pay | Admitting: Family Medicine

## 2021-08-20 DIAGNOSIS — E559 Vitamin D deficiency, unspecified: Secondary | ICD-10-CM

## 2021-08-24 ENCOUNTER — Other Ambulatory Visit (HOSPITAL_COMMUNITY): Payer: Self-pay

## 2021-10-02 ENCOUNTER — Other Ambulatory Visit (HOSPITAL_COMMUNITY): Payer: Self-pay

## 2021-10-02 ENCOUNTER — Other Ambulatory Visit: Payer: Self-pay | Admitting: Family Medicine

## 2021-10-02 ENCOUNTER — Other Ambulatory Visit: Payer: Self-pay | Admitting: Physician Assistant

## 2021-10-02 MED ORDER — FLUTICASONE PROPIONATE 50 MCG/ACT NA SUSP
1.0000 | Freq: Every day | NASAL | 0 refills | Status: DC
  Filled 2021-10-02: qty 16, 60d supply, fill #0

## 2021-10-02 MED ORDER — CITALOPRAM HYDROBROMIDE 20 MG PO TABS
20.0000 mg | ORAL_TABLET | Freq: Every day | ORAL | 1 refills | Status: DC
  Filled 2021-10-02: qty 90, 90d supply, fill #0
  Filled 2022-01-04: qty 90, 90d supply, fill #1

## 2021-10-02 MED ORDER — BALSALAZIDE DISODIUM 750 MG PO CAPS
1500.0000 mg | ORAL_CAPSULE | Freq: Two times a day (BID) | ORAL | 3 refills | Status: DC
  Filled 2021-10-02: qty 120, 30d supply, fill #0

## 2021-10-02 MED ORDER — PANTOPRAZOLE SODIUM 20 MG PO TBEC
20.0000 mg | DELAYED_RELEASE_TABLET | Freq: Every day | ORAL | 1 refills | Status: DC
  Filled 2021-10-02: qty 90, 90d supply, fill #0
  Filled 2022-01-04: qty 90, 90d supply, fill #1

## 2021-10-04 ENCOUNTER — Other Ambulatory Visit (HOSPITAL_COMMUNITY): Payer: Self-pay

## 2021-11-02 DIAGNOSIS — H52223 Regular astigmatism, bilateral: Secondary | ICD-10-CM | POA: Diagnosis not present

## 2021-11-02 DIAGNOSIS — H5212 Myopia, left eye: Secondary | ICD-10-CM | POA: Diagnosis not present

## 2021-11-02 DIAGNOSIS — H524 Presbyopia: Secondary | ICD-10-CM | POA: Diagnosis not present

## 2021-11-02 DIAGNOSIS — H5201 Hypermetropia, right eye: Secondary | ICD-10-CM | POA: Diagnosis not present

## 2021-11-07 ENCOUNTER — Other Ambulatory Visit (HOSPITAL_COMMUNITY): Payer: Self-pay

## 2021-11-07 DIAGNOSIS — K5100 Ulcerative (chronic) pancolitis without complications: Secondary | ICD-10-CM | POA: Diagnosis not present

## 2021-11-07 DIAGNOSIS — Z862 Personal history of diseases of the blood and blood-forming organs and certain disorders involving the immune mechanism: Secondary | ICD-10-CM | POA: Diagnosis not present

## 2021-11-07 DIAGNOSIS — E559 Vitamin D deficiency, unspecified: Secondary | ICD-10-CM | POA: Diagnosis not present

## 2021-11-07 MED ORDER — MESALAMINE ER 0.375 G PO CP24
1.5000 g | ORAL_CAPSULE | Freq: Every morning | ORAL | 3 refills | Status: DC
  Filled 2021-11-07: qty 120, 30d supply, fill #0

## 2021-11-12 ENCOUNTER — Other Ambulatory Visit: Payer: Self-pay

## 2021-11-12 ENCOUNTER — Emergency Department
Admission: EM | Admit: 2021-11-12 | Discharge: 2021-11-12 | Disposition: A | Discharge status: Home / Self Care | Payer: 59 | Attending: Emergency Medicine | Admitting: Emergency Medicine

## 2021-11-12 ENCOUNTER — Emergency Department: Payer: 59

## 2021-11-12 DIAGNOSIS — N5089 Other specified disorders of the male genital organs: Secondary | ICD-10-CM | POA: Diagnosis not present

## 2021-11-12 DIAGNOSIS — N492 Inflammatory disorders of scrotum: Secondary | ICD-10-CM

## 2021-11-12 DIAGNOSIS — N433 Hydrocele, unspecified: Secondary | ICD-10-CM | POA: Diagnosis not present

## 2021-11-12 DIAGNOSIS — N503 Cyst of epididymis: Secondary | ICD-10-CM | POA: Diagnosis not present

## 2021-11-12 MED ORDER — IBUPROFEN 800 MG PO TABS
800.0000 mg | ORAL_TABLET | ORAL | Status: AC
  Administered 2021-11-12: 800 mg via ORAL
  Filled 2021-11-12: qty 1

## 2021-11-12 MED ORDER — CLINDAMYCIN HCL 300 MG PO CAPS: 300.0000 mg | ORAL_CAPSULE | Freq: Three times a day (TID) | ORAL | 0 refills | Status: DC

## 2021-11-12 MED ORDER — HYDROCODONE-ACETAMINOPHEN 5-325 MG PO TABS: 1.0000 | ORAL_TABLET | Freq: Four times a day (QID) | ORAL | 0 refills | Status: DC | PRN

## 2021-11-12 MED ORDER — CLINDAMYCIN HCL 150 MG PO CAPS
300.0000 mg | ORAL_CAPSULE | Freq: Once | ORAL | Status: AC
  Administered 2021-11-12: 300 mg via ORAL
  Filled 2021-11-12: qty 2

## 2021-11-12 NOTE — ED Triage Notes (Signed)
Pt comes with c/o abscess located in left inner thigh that he noticed last Thursday. Pt states painful and feels like it is about to rip. ?

## 2021-11-12 NOTE — Discharge Instructions (Addendum)
Please follow-up with urology clinic tomorrow, call Dr. Dene Gentry office in the morning to set this follow-up appointment up. ? ?Please return the emergency room right away if he notices increasing pain redness swelling or feel the infection is worsening.  May return at any time if any other new concerns or symptoms also arise. ?

## 2021-11-12 NOTE — ED Provider Notes (Signed)
? ?[facility] ?Provider Note ? ? ? Event Date/Time  ? First MD Initiated Contact with Patient 11/12/21 1541   ?  (approximate) ? ? ?History  ? ?Abscess ? ? ?HPI ? ?Patrick Oconnor is a 56 y.o. male who on my review of January 5 of this years primary care note history of restless leg swelling syndrome ulcerative colitis ?  ?Patient reports for about 4 days now he has had pain and swelling along the edge of his left scrotum.  He is reports he has a little area that is off and there is like a small nodule and he picked at it and it felt like it might have become inflamed and has become very sore painful about 8 out of 10 pain to walk or lay down.  Pain seems to be better if he keeps pressure off the area. ? ?No pain or burning with urination.  No pain in the buttock or rectal area.  No fevers. ? ?Works as a Community education officer at [employer]. ? ?Physical Exam  ? ?Triage Vital Signs: ?ED Triage Vitals  ?Enc Vitals Group  ?   BP 11/12/21 1300 (!) 157/90  ?   Pulse Rate 11/12/21 1300 61  ?   Resp 11/12/21 1300 18  ?   Temp 11/12/21 1300 98.2 ?F (36.8 ?C)  ?   Temp Source 11/12/21 1300 Oral  ?   SpO2 11/12/21 1300 91 %  ?   Weight --   ?   Height --   ?   Head Circumference --   ?   Peak Flow --   ?   Pain Score 11/12/21 1257 8  ?   Pain Loc --   ?   Pain Edu? --   ?   Excl. in [place]? --   ? ? ?Most recent vital signs: ?Vitals:  ? 11/12/21 1300  ?BP: (!) 157/90  ?Pulse: 61  ?Resp: 18  ?Temp: 98.2 ?F (36.8 ?C)  ?SpO2: 91%  ? ? ? ?General: Awake, no distress.  ?CV:  Good peripheral perfusion.  ?Resp:  Normal effort.  ?Abd:  No distention.  Soft nontender lower abdomen.  Perineum is examined and normal.  There is no swelling erythema or redness over the perineum or buttock or upper thighs bilaterally.  The patient does along the left inguinal fold and slightly medial at the edge of the scrotum and inguinal region has notable area probably 4 to 5 cm in length and slightly indurated that is tender to  touch and extends slightly into the edge of the left scrotum.  The area is painful.  No obvious drainage.  There is no necrosis.  No noted hernia. ?Other:   ? ? ?ED Results / Procedures / Treatments  ? ?Labs ?(all labs ordered are listed, but only abnormal results are displayed) ?Labs Reviewed - No data to display ? ? ?EKG ? ? ? ? ?RADIOLOGY ?Personally viewed and interpreted the patient's scrotal imaging is concerning for possible developing abscess or phlegmon ? ?In addition, reviewed radiologist report below, and I discussed radiologist report clinical presentation and history with our consultant of urology Dr. Bernardo Heater ? ?Lateral inferior to the left testicle is focal heterogeneous soft  ?tissue measuring 3.5 x 3.4 x 1.4 cm,, likely due to like  ?phlegmon/developing abscess. Recommend follow-up testicular  ?ultrasound following treatment to ensure resolution.  ? ? ?Upon review, Dr. Bernardo Heater advises treatment with clindamycin and follow-up in his office tomorrow.  I discussed  this with the patient and the patient is also in agreement.  I think is a reasonable plan, there is no evidence of acute necrotizing process, and there is concern for possible developing phlegmon or abscess.  Close follow-up with urology is appropriate at this time, patient understanding very agreeable with this plan as well. ? ? ? ?PROCEDURES: ? ?Critical Care performed: No ? ?Procedures ? ? ?MEDICATIONS ORDERED IN ED: ?Medications  ?clindamycin (CLEOCIN) capsule 300 mg (has no administration in time range)  ?ibuprofen (ADVIL) tablet 800 mg (800 mg Oral Given 11/12/21 1559)  ? ? ? ?IMPRESSION / MDM / ASSESSMENT AND PLAN / ED COURSE  ?I reviewed the triage vital signs and the nursing notes. ?             ?               ? ?Differential diagnosis includes, but is not limited to, probable abscess swelling or lesion along the left edge of the scrotum or convenience with the inguinal region.  There is no evidence of tracking up into the perineum or  buttock.  The patient is alert normal vital signs except slightly hypertensive without evidence of sepsis.  Clinical exam seems most consistent with likely a abscess in this area, but I will evaluate further with ultrasound to further evaluate.  Does not have obvious necrosis or severe erythema or pain, and he does not involve the perineum.  This appears to be unlikely Fournier's ? ?Patient did drive here, he requests ibuprofen for pain at this time.  If stronger pain medications are required he reports he can get a friend to drive him home. ? ?----------------------------------------- ?5:52 PM on 11/12/2021 ?----------------------------------------- ? ?Return precautions and treatment recommendations and follow-up discussed with the patient who is agreeable with the plan. ? ?Patient will be following up with Dr. Bernardo Heater tomorrow.  Also sent urologist patient's info as well, he advises clinic will plan to reach out as well ? ?I will prescribe the patient a narcotic pain medicine due to their condition which I anticipate will cause at least moderate pain short term. I discussed with the patient safe use of narcotic pain medicines, and that they are not to drive, work in dangerous areas, or ever take more than prescribed. ? ? ? ? ? ? ?  ? ? ?FINAL CLINICAL IMPRESSION(S) / ED DIAGNOSES  ? ?Final diagnoses:  ?Abscess of scrotal wall  ? ? ? ?Rx / DC Orders  ? ?ED Discharge Orders   ? ?      Ordered  ?  HYDROcodone-acetaminophen (NORCO/VICODIN) 5-325 MG tablet  Every 6 hours PRN       ? 11/12/21 1755  ?  clindamycin (CLEOCIN) 300 MG capsule  3 times daily       ? 11/12/21 1755  ? ?  ?  ? ?  ? ? ? ?Note:  This document was prepared using Dragon voice recognition software and may include unintentional dictation errors. ?  Delman Kitten, MD ?11/12/21 1756 ? ?

## 2021-11-14 ENCOUNTER — Encounter: Payer: Self-pay | Admitting: Urology

## 2021-11-14 ENCOUNTER — Ambulatory Visit (INDEPENDENT_AMBULATORY_CARE_PROVIDER_SITE_OTHER): Payer: 59 | Admitting: Urology

## 2021-11-14 VITALS — BP 130/80 | HR 74 | Temp 100.9°F | Ht 74.0 in | Wt 240.0 lb

## 2021-11-14 DIAGNOSIS — N50819 Testicular pain, unspecified: Secondary | ICD-10-CM | POA: Diagnosis not present

## 2021-11-14 DIAGNOSIS — N492 Inflammatory disorders of scrotum: Secondary | ICD-10-CM | POA: Diagnosis not present

## 2021-11-14 LAB — MICROSCOPIC EXAMINATION
Bacteria, UA: NONE SEEN
Epithelial Cells (non renal): NONE SEEN /hpf (ref 0–10)
RBC, Urine: NONE SEEN /hpf (ref 0–2)

## 2021-11-14 LAB — URINALYSIS, COMPLETE
Bilirubin, UA: NEGATIVE
Glucose, UA: NEGATIVE
Ketones, UA: NEGATIVE
Leukocytes,UA: NEGATIVE
Nitrite, UA: NEGATIVE
RBC, UA: NEGATIVE
Specific Gravity, UA: 1.025 (ref 1.005–1.030)
Urobilinogen, Ur: 0.2 mg/dL (ref 0.2–1.0)
pH, UA: 5.5 (ref 5.0–7.5)

## 2021-11-15 ENCOUNTER — Encounter: Payer: Self-pay | Admitting: Urology

## 2021-11-15 ENCOUNTER — Ambulatory Visit (INDEPENDENT_AMBULATORY_CARE_PROVIDER_SITE_OTHER): Payer: 59 | Admitting: Physician Assistant

## 2021-11-15 ENCOUNTER — Encounter: Payer: Self-pay | Admitting: Physician Assistant

## 2021-11-15 VITALS — BP 165/87 | HR 99 | Ht 74.0 in | Wt 240.0 lb

## 2021-11-15 DIAGNOSIS — N492 Inflammatory disorders of scrotum: Secondary | ICD-10-CM

## 2021-11-15 MED ORDER — OXYCODONE-ACETAMINOPHEN 5-325 MG PO TABS: 1.0000 | ORAL_TABLET | Freq: Four times a day (QID) | ORAL | 0 refills | Status: AC | PRN

## 2021-11-15 MED ORDER — SULFAMETHOXAZOLE-TRIMETHOPRIM 800-160 MG PO TABS: 1.0000 | ORAL_TABLET | Freq: Two times a day (BID) | ORAL | 0 refills | Status: AC

## 2021-11-15 NOTE — Progress Notes (Signed)
Procedure note ? ?Diagnosis: Scrotal wall abscess-left ? ?Procedure: Incision and drainage scrotal abscess ? ?Anesthesia: 1% lidocaine local ? ?Description: The skin overlying the area of fluctuance was anesthetized with 5 mL of 1% plain Xylocaine.  An initial 1 cm incision was performed with return of ~ 20 mL of dark, bloody purulent material with particulate matter.  The site was swabbed and aerobic/anaerobic cultures were sent.  The incision was extended 1 cm each way superiorly and inferiorly.  A hemostat was inserted into the abscess cavity and no loculations were noted.  The cavity was copiously irrigated with sterile saline.  Hemostasis was noted be adequate and the site was packed with 1/4 inch iodoform gauze.  He tolerated the procedure well without complications ? ?Plan: See office visit note.  Follow-up visit this morning for wound check/dressing change ? ? ?John Giovanni, MD ? ?

## 2021-11-15 NOTE — Progress Notes (Signed)
Patient returned to clinic today for wound check and repacking. He reports improvement in scrotal swelling and erythema since undergoing I&D in the office yesterday. Dr. Bernardo Heater has sent in Bactrim for him this morning but he has not yet started this.  ? ?On physical exam, scrotum is edematous and erythematous with some induration along the left inguinal crease. No fluctuance, purulence, or crepitus noted. ? ?Patient works in wound care. He removed the iodoform gauze earlier today. I repacked his wound with iodoform gauze, leaving a tail. He tolerated this well. ? ?We discussed daily wound changes and starting Bactrim today. I am prescribing Percocet for pain control. We discussed return precautions including fever and increased swelling/erythema. ? ?Patrick Loop, PA-C ?11/15/21 ?12:18 PM ? ?Return in about 1 month (around 12/15/2021) for Wound recheck.  ? ?I spent 18 minutes on the day of the encounter to include pre-visit record review, face-to-face time with the patient, and post-visit ordering of tests.  ?

## 2021-11-15 NOTE — Progress Notes (Signed)
? ?11/14/2021 ?6:55 AM  ? ?Patrick Oconnor ?[birth year] ?[id number] ? ?Referring provider: Tonia Ghent, MD ?[facility] ?[place],  [place] [address] ? ?Chief Complaint  ?Patient presents with  ? Abscess  ? ? ?HPI: ?Patrick Oconnor is a 56 y.o. male who presents for recent ED follow-up for scrotal cellulitis with possible early abscess formation. ? ?ED visit 11/12/2021 with a 4-day history of left hemiscrotal pain and swelling he had noted a small nodule in the area a few days prior and tried to express any fluid and had subsequent increased pain and swelling ?Pain was rated at 8/10 ?Scrotal ultrasound was performed which was felt to show phlegmon and possible developing abscess lateral to the left hemiscrotum.  Testes were normal in appearance. ?ED visit remarkable for induration without fluctuance and he was started on clindamycin with urology follow-up recommended. ?Since ED visit he has had worsening pain and swelling.  Currently rates pain at 8.5/10.  He had a temp to 100 degrees yesterday ?No previous history of similar problems.  No history of diabetes ? ? ?PMH: ?Past Medical History:  ?Diagnosis Date  ? Anemia   ? Asthma, exercise induced   ? Degenerative disc disease, lumbar   ? Depression   ? Environmental allergies   ? GERD (gastroesophageal reflux disease)   ? History of bronchitis   ? Hypertension   ? IBS (irritable bowel syndrome)   ? Pneumonia   ? hx of  ? PONV (postoperative nausea and vomiting)   ? Sleep apnea 04/2007  ? Started CPAP; wears CPAP nightly  ? Ulcerative colitis ([place])   ? ? ?Surgical History: ?Past Surgical History:  ?Procedure Laterality Date  ? ACROMIO-CLAVICULAR JOINT REPAIR Right 04/15/2013  ? Procedure: RIGHT SHOULDER ACROMIO-CLAVICULAR RECONSTRUCTION WITH ALLOGRAFT   ;  Surgeon: Patrick Shutter, MD;  Location: [facility];  Service: Orthopedics;  Laterality: Right;  ? ACROMIO-CLAVICULAR JOINT REPAIR Right 06/03/2013  ? Procedure: ACROMIO-CLAVICULAR JOINT REPAIR;  Surgeon:  Patrick Shutter, MD;  Location: [facility];  Service: Orthopedics;  Laterality: Right;  ? ACROMIO-CLAVICULAR JOINT REPAIR Right 10/19/2015  ? Procedure: OPEN RIGHT SHOULDER ACROMIO-CLAVICULAR JOINT RECONSTRUCTION ;  Surgeon: Patrick Britain, MD;  Location: [facility];  Service: Orthopedics;  Laterality: Right;  ? COLONOSCOPY  08/12/2005  ? "multiple"  ? ESOPHAGOGASTRODUODENOSCOPY  3/09  ? normal; multiple  ? ESOPHAGOGASTRODUODENOSCOPY (EGD) WITH PROPOFOL N/A 01/22/2016  ? Procedure: ESOPHAGOGASTRODUODENOSCOPY (EGD) WITH PROPOFOL;  Surgeon: Patrick Spates, MD;  Location: [facility];  Service: Endoscopy;  Laterality: N/A;  ? INCISION AND DRAINAGE Right 06/03/2013  ? Procedure: INCISION AND DRAINAGE RIGHT SHOULDER;  Surgeon: Patrick Shutter, MD;  Location: [facility];  Service: Orthopedics;  Laterality: Right;  ? pilonidal cystectomy  42003  ? x 2  ? PYLOROPLASTY  infant  ? pyloric stenosis  ? SEPTOPLASTY    ? SHOULDER OPEN ROTATOR CUFF REPAIR Right 10/19/2015  ? Procedure: OPEN RIGHT SHOULDER ROTATOR CUFF REPAIR ;  Surgeon: Patrick Britain, MD;  Location: [facility];  Service: Orthopedics;  Laterality: Right;  ? thrombosed external hemorrhoid  9/08  ? incised  ? TONSILLECTOMY AND ADENOIDECTOMY    ? ? ?Home Medications:  ?Allergies as of 11/14/2021   ?No Known Allergies ?  ? ?  ?Medication List  ?  ? ?  ? Accurate as of November 14, 2021 11:59 PM. If you have any questions, ask your nurse or doctor.  ?  ?  ? ?  ? ?albuterol 108 (90  Base) MCG/ACT inhaler ?Commonly known as: VENTOLIN HFA ?Inhale 1-2 puffs into the lungs every 6 (six) hours as needed for wheezing (prior to exercise.). ?  ?balsalazide 750 MG capsule ?Commonly known as: COLAZAL ?2 capsules twice a day ?  ?citalopram 20 MG tablet ?Commonly known as: CELEXA ?Take 1 tablet (20 mg total) by mouth daily. ?  ?clindamycin 300 MG capsule ?Commonly known as: CLEOCIN ?Take 1 capsule (300 mg total) by mouth 3 (three) times daily. ?  ?Dulcolax 5 MG EC tablet ?Generic drug: bisacodyl ?  ?ferrous sulfate 325  (65 FE) MG tablet ?Take 1 tablet (325 mg total) by mouth daily. ?  ?fluticasone 50 MCG/ACT nasal spray ?Commonly known as: FLONASE ?Place 1 spray into both nostrils daily. ?  ?HYDROcodone-acetaminophen 5-325 MG tablet ?Commonly known as: NORCO/VICODIN ?Take 1 tablet by mouth every 6 (six) hours as needed for moderate pain. ?  ?loratadine 10 MG tablet ?Commonly known as: CLARITIN ?Take 10 mg by mouth daily. ?  ?mesalamine 0.375 g 24 hr capsule ?Commonly known as: APRISO ?Take 4 capsules (1.5 g total) by mouth every morning. ?  ?pantoprazole 20 MG tablet ?Commonly known as: PROTONIX ?Take 1 tablet (20 mg total) by mouth daily. ?  ?polyethylene glycol powder 17 GM/SCOOP powder ?Commonly known as: GLYCOLAX/MIRALAX ?Take 17 g by mouth 2 (two) times daily as needed (For constipation.). ?  ?PROBIOTIC DAILY PO ?Take 1 capsule by mouth daily as needed (For digestive health.). ?  ?rOPINIRole 1 MG tablet ?Commonly known as: REQUIP ?Take 1-2 tablets (1-2 mg total) by mouth daily as needed (for RLS). ?  ?sennosides-docusate sodium 8.6-50 MG tablet ?Commonly known as: SENOKOT-S ?Take 1-2 tablets by mouth daily as needed for constipation. ?  ?[place] OP ?Apply 1-2 drops to eye as needed (For eye irritation.). ?  ?Vitamin D 50 MCG (2000 UT) Caps ?Take 1 capsule by mouth daily. ?  ? ?  ? ? ?Allergies: No Known Allergies ? ?Family History: ?Family History  ?Problem Relation Age of Onset  ? Cancer Father 41  ?     prostate Stage 4  ? Prostate cancer Father   ? Cancer Maternal Grandmother   ?     breast  ? Cancer Mother   ? Cancer Paternal Grandmother   ?     Colon  ? Colon cancer Paternal Grandmother   ? ? ?Social History:  reports that he has never smoked. He has never used smokeless tobacco. He reports current alcohol use of about 0.3 standard drinks per week. He reports that he does not use drugs. ? ? ?Physical Exam: ?BP 130/80   Pulse 74   Temp (!) 100.9 ?F (38.3 ?C) (Oral)   Ht '6\' 2"'$  (1.88 m)   Wt 240 lb (108.9  kg)   BMI 30.81 kg/m?   ?Constitutional:  Alert and oriented, No acute distress. ?HEENT: [place] AT, moist mucus membranes.  Trachea midline, no masses. ?Cardiovascular: No clubbing, cyanosis, or edema. ?Respiratory: Normal respiratory effort, no increased work of breathing. ?GI: Abdomen is soft, nontender, nondistended, no abdominal masses ?GU: Penile retraction present with scrotal edema and erythema without tenderness.  Testes descended bilaterally without masses or tenderness.  At the lateral margin of the left hemiscrotum inferiorly is a 3 cm area of fluctuance and significant tenderness.  Erythema and induration superiorly and inferiorly to this area.  No crepitus or skin necrosis ?Skin: As above ?Neurologic: Grossly intact, no focal deficits, moving all 4 extremities. ?Psychiatric: Normal mood and affect. ? ? ?  Pertinent Imaging: ?Scrotal ultrasound images from 11/12/2021 were personally reviewed and interpreted ? ? ?Assessment & Plan:   ? ?1.  Scrotal abscess ?Area of fluctuance as above ?Incision and drainage performed-see procedure note ?He does have cellulitis which has not improved with clindamycin ?I discussed admission to hospitalist service for IV antibiotics however he wanted to hold off to see how he does over the next 24 hours ?Will change antibiotics to Septra DS ?Prescribed hydrocodone in ED 4/3 and has ~ 7 pills left ?Instructed to call or proceed to ED for increasing fever greater than 101 degrees or increasing pain ?Follow-up appointment 11/15/2021 for wound check and dressing change ? ? ?Abbie Sons, MD ? ?[facility] ?[address] ?[place], [place] [address] ?[phone] ? ?

## 2021-11-28 LAB — ANAEROBIC AND AEROBIC CULTURE

## 2021-12-21 ENCOUNTER — Encounter: Payer: Self-pay | Admitting: Urology

## 2021-12-21 ENCOUNTER — Ambulatory Visit (INDEPENDENT_AMBULATORY_CARE_PROVIDER_SITE_OTHER): Payer: 59 | Admitting: Urology

## 2021-12-21 VITALS — BP 168/92 | HR 67 | Ht 74.0 in | Wt 240.0 lb

## 2021-12-21 DIAGNOSIS — N492 Inflammatory disorders of scrotum: Secondary | ICD-10-CM | POA: Diagnosis not present

## 2021-12-21 MED ORDER — METRONIDAZOLE 500 MG PO TABS: 500.0000 mg | ORAL_TABLET | Freq: Three times a day (TID) | ORAL | 0 refills | Status: AC

## 2021-12-21 NOTE — Progress Notes (Signed)
? ?12/21/2021 ?10:39 AM  ? ?Patrick Oconnor ?[birth year] ?[id number] ? ?Referring provider: Tonia Ghent, MD ?[facility] ?[place],  [place] [address] ? ?Chief Complaint  ?Patient presents with  ? Follow-up  ? ? ?HPI: ?56 y.o. male presents for follow-up visit. ? ?I&D of scrotal abscess 11/14/2021 ?He states the area has healed; does note mild induration and discomfort ?Final culture returned light growth Prevotella species ? ? ?PMH: ?Past Medical History:  ?Diagnosis Date  ? Anemia   ? Asthma, exercise induced   ? Degenerative disc disease, lumbar   ? Depression   ? Environmental allergies   ? GERD (gastroesophageal reflux disease)   ? History of bronchitis   ? Hypertension   ? IBS (irritable bowel syndrome)   ? Pneumonia   ? hx of  ? PONV (postoperative nausea and vomiting)   ? Sleep apnea 04/2007  ? Started CPAP; wears CPAP nightly  ? Ulcerative colitis ([place])   ? ? ?Surgical History: ?Past Surgical History:  ?Procedure Laterality Date  ? ACROMIO-CLAVICULAR JOINT REPAIR Right 04/15/2013  ? Procedure: RIGHT SHOULDER ACROMIO-CLAVICULAR RECONSTRUCTION WITH ALLOGRAFT   ;  Surgeon: Marin Shutter, MD;  Location: [facility];  Service: Orthopedics;  Laterality: Right;  ? ACROMIO-CLAVICULAR JOINT REPAIR Right 06/03/2013  ? Procedure: ACROMIO-CLAVICULAR JOINT REPAIR;  Surgeon: Marin Shutter, MD;  Location: [facility];  Service: Orthopedics;  Laterality: Right;  ? ACROMIO-CLAVICULAR JOINT REPAIR Right 10/19/2015  ? Procedure: OPEN RIGHT SHOULDER ACROMIO-CLAVICULAR JOINT RECONSTRUCTION ;  Surgeon: Justice Britain, MD;  Location: [facility];  Service: Orthopedics;  Laterality: Right;  ? COLONOSCOPY  08/12/2005  ? "multiple"  ? ESOPHAGOGASTRODUODENOSCOPY  3/09  ? normal; multiple  ? ESOPHAGOGASTRODUODENOSCOPY (EGD) WITH PROPOFOL N/A 01/22/2016  ? Procedure: ESOPHAGOGASTRODUODENOSCOPY (EGD) WITH PROPOFOL;  Surgeon: Laurence Spates, MD;  [facility];  Service: Endoscopy;  Laterality: N/A;  ? INCISION AND DRAINAGE Right 06/03/2013  ?  Procedure: INCISION AND DRAINAGE RIGHT SHOULDER;  Surgeon: Marin Shutter, MD;  Location: [facility];  Service: Orthopedics;  Laterality: Right;  ? pilonidal cystectomy  42003  ? x 2  ? PYLOROPLASTY  infant  ? pyloric stenosis  ? SEPTOPLASTY    ? SHOULDER OPEN ROTATOR CUFF REPAIR Right 10/19/2015  ? Procedure: OPEN RIGHT SHOULDER ROTATOR CUFF REPAIR ;  Surgeon: Justice Britain, MD;  Location: [facility];  Service: Orthopedics;  Laterality: Right;  ? thrombosed external hemorrhoid  9/08  ? incised  ? TONSILLECTOMY AND ADENOIDECTOMY    ? ? ?Home Medications:  ?Allergies as of 12/21/2021   ?No Known Allergies ?  ? ?  ?Medication List  ?  ? ?  ? Accurate as of Dec 21, 2021 10:39 AM. If you have any questions, ask your nurse or doctor.  ?  ?  ? ?  ? ?STOP taking these medications   ? ?clindamycin 300 MG capsule ?Commonly known as: CLEOCIN ?Stopped by: Abbie Sons, MD ?  ?HYDROcodone-acetaminophen 5-325 MG tablet ?Commonly known as: NORCO/VICODIN ?Stopped by: Abbie Sons, MD ?  ? ?  ? ?TAKE these medications   ? ?albuterol 108 (90 Base) MCG/ACT inhaler ?Commonly known as: VENTOLIN HFA ?Inhale 1-2 puffs into the lungs every 6 (six) hours as needed for wheezing (prior to exercise.). ?  ?balsalazide 750 MG capsule ?Commonly known as: COLAZAL ?2 capsules twice a day ?  ?citalopram 20 MG tablet ?Commonly known as: CELEXA ?Take 1 tablet (20 mg total) by mouth daily. ?  ?Dulcolax 5 MG  EC tablet ?Generic drug: bisacodyl ?  ?ferrous sulfate 325 (65 FE) MG tablet ?Take 1 tablet (325 mg total) by mouth daily. ?  ?fluticasone 50 MCG/ACT nasal spray ?Commonly known as: FLONASE ?Place 1 spray into both nostrils daily. ?  ?loratadine 10 MG tablet ?Commonly known as: CLARITIN ?Take 10 mg by mouth daily. ?  ?mesalamine 0.375 g 24 hr capsule ?Commonly known as: APRISO ?Take 4 capsules (1.5 g total) by mouth every morning. ?  ?pantoprazole 20 MG tablet ?Commonly known as: PROTONIX ?Take 1 tablet (20 mg total) by mouth daily. ?  ?polyethylene glycol  powder 17 GM/SCOOP powder ?Commonly known as: GLYCOLAX/MIRALAX ?Take 17 g by mouth 2 (two) times daily as needed (For constipation.). ?  ?PROBIOTIC DAILY PO ?Take 1 capsule by mouth daily as needed (For digestive health.). ?  ?rOPINIRole 1 MG tablet ?Commonly known as: REQUIP ?Take 1-2 tablets (1-2 mg total) by mouth daily as needed (for RLS). ?  ?sennosides-docusate sodium 8.6-50 MG tablet ?Commonly known as: SENOKOT-S ?Take 1-2 tablets by mouth daily as needed for constipation. ?  ?[place] OP ?Apply 1-2 drops to eye as needed (For eye irritation.). ?  ?Vitamin D 50 MCG (2000 UT) Caps ?Take 1 capsule by mouth daily. ?  ? ?  ? ? ?Allergies: No Known Allergies ? ?Family History: ?Family History  ?Problem Relation Age of Onset  ? Cancer Father 82  ?     prostate Stage 4  ? Prostate cancer Father   ? Cancer Maternal Grandmother   ?     breast  ? Cancer Mother   ? Cancer Paternal Grandmother   ?     Colon  ? Colon cancer Paternal Grandmother   ? ? ?Social History:  reports that he has never smoked. He has never used smokeless tobacco. He reports current alcohol use of about 0.3 standard drinks per week. He reports that he does not use drugs. ? ? ?Physical Exam: ?BP (!) 168/92   Pulse 67   Ht '6\' 2"'$  (1.88 m)   Wt 240 lb (108.9 kg)   BMI 30.81 kg/m?   ?Constitutional:  Alert and oriented, No acute distress. ?GU: I&D site has healed.  Slight fluctuance superiorly and mild surrounding induration.  No erythema.  Aspiration was performed and no significant fluid was obtained however after needle withdrawn small amount of cloudy fluid was expressed ? ? ? ?Assessment & Plan:   ?Status post I&D scrotal abscess ?Small amount of purulent fluid expressed on aspiration today ?Based on final culture report will start Flagyl ?Instructed to call for worsening swelling, drainage or erythema ?Follow-up visit 2 weeks ? ? ?Abbie Sons, MD ? ?[facility] ?[address], [address] ?[place], [place] [address] ?[phone] ? ?

## 2021-12-24 ENCOUNTER — Other Ambulatory Visit (HOSPITAL_COMMUNITY): Payer: Self-pay

## 2022-01-04 ENCOUNTER — Other Ambulatory Visit: Payer: Self-pay | Admitting: Physician Assistant

## 2022-01-04 ENCOUNTER — Ambulatory Visit (INDEPENDENT_AMBULATORY_CARE_PROVIDER_SITE_OTHER): Payer: 59 | Admitting: Urology

## 2022-01-04 ENCOUNTER — Other Ambulatory Visit (HOSPITAL_COMMUNITY): Payer: Self-pay

## 2022-01-04 ENCOUNTER — Encounter: Payer: Self-pay | Admitting: Urology

## 2022-01-04 VITALS — BP 148/80 | HR 67 | Ht 72.0 in | Wt 240.0 lb

## 2022-01-04 DIAGNOSIS — N492 Inflammatory disorders of scrotum: Secondary | ICD-10-CM

## 2022-01-04 MED ORDER — SULFAMETHOXAZOLE-TRIMETHOPRIM 800-160 MG PO TABS: 1.0000 | ORAL_TABLET | Freq: Two times a day (BID) | ORAL | 0 refills | Status: DC

## 2022-01-04 NOTE — Progress Notes (Signed)
01/04/2022 9:47 AM   Patrick Oconnor Nov 21, 1965 [id number]  Referring provider: Tonia Ghent, MD [place],  [place] [address]  Chief Complaint  Patient presents with   Follow-up    Abcess recheck    HPI: 56 y.o. male presents for follow-up visit.  I&D of scrotal abscess 11/14/2021 Follow-up visit 5/12 noted to have small amount of fluctuance over the area.  Aspiration was performed and small amount of purulent fluid was expressed.  States the day after aspiration he had a fair amount of drainage and has tried to keep the site open.  No pain, fever or chills   PMH: Past Medical History:  Diagnosis Date   Anemia    Asthma, exercise induced    Degenerative disc disease, lumbar    Depression    Environmental allergies    GERD (gastroesophageal reflux disease)    History of bronchitis    Hypertension    IBS (irritable bowel syndrome)    Pneumonia    hx of   PONV (postoperative nausea and vomiting)    Sleep apnea 04/2007   Started CPAP; wears CPAP nightly   Ulcerative colitis [facility])     Surgical History: Past Surgical History:  Procedure Laterality Date   ACROMIO-CLAVICULAR JOINT REPAIR Right 04/15/2013   Procedure: RIGHT SHOULDER ACROMIO-CLAVICULAR RECONSTRUCTION WITH ALLOGRAFT   ;  Surgeon: Marin Shutter, MD;  Location: [facility];  Service: Orthopedics;  Laterality: Right;   ACROMIO-CLAVICULAR JOINT REPAIR Right 06/03/2013   Procedure: ACROMIO-CLAVICULAR JOINT REPAIR;  Surgeon: Marin Shutter, MD;  Location: [facility];  Service: Orthopedics;  Laterality: Right;   ACROMIO-CLAVICULAR JOINT REPAIR Right 10/19/2015   Procedure: OPEN RIGHT SHOULDER ACROMIO-CLAVICULAR JOINT RECONSTRUCTION ;  Surgeon: Justice Britain, MD;  Location: [facility];  Service: Orthopedics;  Laterality: Right;   COLONOSCOPY  08/12/2005   "multiple"   ESOPHAGOGASTRODUODENOSCOPY  3/09   normal; multiple   ESOPHAGOGASTRODUODENOSCOPY (EGD) WITH PROPOFOL N/A 01/22/2016   Procedure:  ESOPHAGOGASTRODUODENOSCOPY (EGD) WITH PROPOFOL;  Surgeon: Laurence Spates, MD;  Location: [facility];  Service: Endoscopy;  Laterality: N/A;   INCISION AND DRAINAGE Right 06/03/2013   Procedure: INCISION AND DRAINAGE RIGHT SHOULDER;  Surgeon: Marin Shutter, MD;  Location: [facility];  Service: Orthopedics;  Laterality: Right;   pilonidal cystectomy  42003   x 2   PYLOROPLASTY  infant   pyloric stenosis   SEPTOPLASTY     SHOULDER OPEN ROTATOR CUFF REPAIR Right 10/19/2015   Procedure: OPEN RIGHT SHOULDER ROTATOR CUFF REPAIR ;  Surgeon: Justice Britain, MD;  Location: [facility];  Service: Orthopedics;  Laterality: Right;   thrombosed external hemorrhoid  9/08   incised   TONSILLECTOMY AND ADENOIDECTOMY      Home Medications:  Allergies as of 01/04/2022   No Known Allergies      Medication List        Accurate as of Jan 04, 2022  9:47 AM. If you have any questions, ask your nurse or doctor.          albuterol 108 (90 Base) MCG/ACT inhaler Commonly known as: VENTOLIN HFA Inhale 1-2 puffs into the lungs every 6 (six) hours as needed for wheezing (prior to exercise.).   balsalazide 750 MG capsule Commonly known as: COLAZAL 2 capsules twice a day   citalopram 20 MG tablet Commonly known as: CELEXA Take 1 tablet (20 mg total) by mouth daily.   Dulcolax 5 MG EC tablet Generic drug: bisacodyl   ferrous sulfate 325 (  65 FE) MG tablet Take 1 tablet (325 mg total) by mouth daily.   fluticasone 50 MCG/ACT nasal spray Commonly known as: FLONASE Place 1 spray into both nostrils daily.   loratadine 10 MG tablet Commonly known as: CLARITIN Take 10 mg by mouth daily.   mesalamine 0.375 g 24 hr capsule Commonly known as: APRISO Take 4 capsules (1.5 g total) by mouth every morning.   pantoprazole 20 MG tablet Commonly known as: PROTONIX Take 1 tablet (20 mg total) by mouth daily.   polyethylene glycol powder 17 GM/SCOOP powder Commonly known as: GLYCOLAX/MIRALAX Take 17 g by mouth 2  (two) times daily as needed (For constipation.).   PROBIOTIC DAILY PO Take 1 capsule by mouth daily as needed (For digestive health.).   rOPINIRole 1 MG tablet Commonly known as: REQUIP Take 1-2 tablets (1-2 mg total) by mouth daily as needed (for RLS).   sennosides-docusate sodium 8.6-50 MG tablet Commonly known as: SENOKOT-S Take 1-2 tablets by mouth daily as needed for constipation.   sulfamethoxazole-trimethoprim 800-160 MG tablet Commonly known as: BACTRIM DS Take 1 tablet by mouth every 12 (twelve) hours. Started by: Abbie Sons, MD   VISINE ADVANCED RELIEF OP Apply 1-2 drops to eye as needed (For eye irritation.).   Vitamin D 50 MCG (2000 UT) Caps Take 1 capsule by mouth daily.        Allergies: No Known Allergies  Family History: Family History  Problem Relation Age of Onset   Cancer Father 55       prostate Stage 4   Prostate cancer Father    Cancer Maternal Grandmother        breast   Cancer Mother    Cancer Paternal Grandmother        Colon   Colon cancer Paternal Grandmother     Social History:  reports that he has never smoked. He has never used smokeless tobacco. He reports current alcohol use of about 0.3 standard drinks per week. He reports that he does not use drugs.   Physical Exam: BP (!) 148/80   Pulse 67   Ht 6' (1.829 m)   Wt 240 lb (108.9 kg)   BMI 32.55 kg/m   Constitutional:  Alert and oriented, No acute distress. GU: Site examined and no erythema, fluctuance or drainage.  The site is indurated most likely due to cyst wall    Assessment & Plan:   Status post I&D scrotal abscess Treat with an additional 1 week antibiotics Follow-up visit 3 weeks Discussed scheduling excision of remaining cyst wall if he desires   Abbie Sons, MD  [facility] [address], [place], [place] [address] [phone]

## 2022-01-08 ENCOUNTER — Other Ambulatory Visit (HOSPITAL_COMMUNITY): Payer: Self-pay

## 2022-01-08 ENCOUNTER — Other Ambulatory Visit: Payer: Self-pay | Admitting: Physician Assistant

## 2022-01-11 ENCOUNTER — Other Ambulatory Visit (HOSPITAL_COMMUNITY): Payer: Self-pay

## 2022-01-11 MED ORDER — FLUTICASONE PROPIONATE 50 MCG/ACT NA SUSP
1.0000 | Freq: Every day | NASAL | 0 refills | Status: AC
  Filled 2022-01-11: qty 16, 60d supply, fill #0

## 2022-01-21 ENCOUNTER — Other Ambulatory Visit (HOSPITAL_COMMUNITY): Payer: Self-pay

## 2022-01-24 ENCOUNTER — Ambulatory Visit (INDEPENDENT_AMBULATORY_CARE_PROVIDER_SITE_OTHER): Payer: 59 | Admitting: Urology

## 2022-01-24 ENCOUNTER — Encounter: Payer: Self-pay | Admitting: Urology

## 2022-01-24 VITALS — BP 165/78 | HR 86 | Ht 72.0 in | Wt 240.0 lb

## 2022-01-24 DIAGNOSIS — N5089 Other specified disorders of the male genital organs: Secondary | ICD-10-CM

## 2022-01-24 DIAGNOSIS — N492 Inflammatory disorders of scrotum: Secondary | ICD-10-CM | POA: Diagnosis not present

## 2022-01-24 NOTE — Progress Notes (Signed)
01/24/2022 9:24 AM   Patrick Oconnor 01-20-1966 [id number]  Referring provider: Tonia Ghent, MD [place],  [place] [address]  Chief Complaint  Patient presents with   Follow-up    HPI: 56 y.o. male presents for follow-up visit.  I&D of scrotal abscess 11/14/2021 Follow-up visit 5/12 noted to have small amount of fluctuance over the area.  Aspiration was performed and small amount of purulent fluid was expressed.  States the day after aspiration he had a fair amount of drainage and has tried to keep the site open.  No pain, fever or chills At last visit 01/04/2022 still having a small amount of drainage and he was treated with an additional 1 week of antibiotics.  He was delayed in starting antibiotics and is still taking No drainage or tenderness at the site   PMH: Past Medical History:  Diagnosis Date   Anemia    Asthma, exercise induced    Degenerative disc disease, lumbar    Depression    Environmental allergies    GERD (gastroesophageal reflux disease)    History of bronchitis    Hypertension    IBS (irritable bowel syndrome)    Pneumonia    hx of   PONV (postoperative nausea and vomiting)    Sleep apnea 04/2007   Started CPAP; wears CPAP nightly   Ulcerative colitis [facility])     Surgical History: Past Surgical History:  Procedure Laterality Date   ACROMIO-CLAVICULAR JOINT REPAIR Right 04/15/2013   Procedure: RIGHT SHOULDER ACROMIO-CLAVICULAR RECONSTRUCTION WITH ALLOGRAFT   ;  Surgeon: Marin Shutter, MD;  Location: [facility];  Service: Orthopedics;  Laterality: Right;   ACROMIO-CLAVICULAR JOINT REPAIR Right 06/03/2013   Procedure: ACROMIO-CLAVICULAR JOINT REPAIR;  Surgeon: Marin Shutter, MD;  Location: [facility];  Service: Orthopedics;  Laterality: Right;   ACROMIO-CLAVICULAR JOINT REPAIR Right 10/19/2015   Procedure: OPEN RIGHT SHOULDER ACROMIO-CLAVICULAR JOINT RECONSTRUCTION ;  Surgeon: Justice Britain, MD;  Location: [facility];  Service: Orthopedics;   Laterality: Right;   COLONOSCOPY  08/12/2005   "multiple"   ESOPHAGOGASTRODUODENOSCOPY  3/09   normal; multiple   ESOPHAGOGASTRODUODENOSCOPY (EGD) WITH PROPOFOL N/A 01/22/2016   Procedure: ESOPHAGOGASTRODUODENOSCOPY (EGD) WITH PROPOFOL;  Surgeon: Laurence Spates, MD;  [facility];  Service: Endoscopy;  Laterality: N/A;   INCISION AND DRAINAGE Right 06/03/2013   Procedure: INCISION AND DRAINAGE RIGHT SHOULDER;  Surgeon: Marin Shutter, MD;  Location: [facility];  Service: Orthopedics;  Laterality: Right;   pilonidal cystectomy  42003   x 2   PYLOROPLASTY  infant   pyloric stenosis   SEPTOPLASTY     SHOULDER OPEN ROTATOR CUFF REPAIR Right 10/19/2015   Procedure: OPEN RIGHT SHOULDER ROTATOR CUFF REPAIR ;  Surgeon: Justice Britain, MD;  Location: [facility];  Service: Orthopedics;  Laterality: Right;   thrombosed external hemorrhoid  9/08   incised   TONSILLECTOMY AND ADENOIDECTOMY      Home Medications:  Allergies as of 01/24/2022   No Known Allergies      Medication List        Accurate as of January 24, 2022  9:24 AM. If you have any questions, ask your nurse or doctor.          albuterol 108 (90 Base) MCG/ACT inhaler Commonly known as: VENTOLIN HFA Inhale 1-2 puffs into the lungs every 6 (six) hours as needed for wheezing (prior to exercise.).   balsalazide 750 MG capsule Commonly known as: COLAZAL 2 capsules twice a  day   citalopram 20 MG tablet Commonly known as: CELEXA Take 1 tablet (20 mg total) by mouth daily.   Dulcolax 5 MG EC tablet Generic drug: bisacodyl   ferrous sulfate 325 (65 FE) MG tablet Take 1 tablet (325 mg total) by mouth daily.   fluticasone 50 MCG/ACT nasal spray Commonly known as: FLONASE Place 1 spray into both nostrils daily.   loratadine 10 MG tablet Commonly known as: CLARITIN Take 10 mg by mouth daily.   mesalamine 0.375 g 24 hr capsule Commonly known as: APRISO Take 4 capsules (1.5 g total) by mouth every morning.   pantoprazole 20 MG  tablet Commonly known as: PROTONIX Take 1 tablet (20 mg total) by mouth daily.   polyethylene glycol powder 17 GM/SCOOP powder Commonly known as: GLYCOLAX/MIRALAX Take 17 g by mouth 2 (two) times daily as needed (For constipation.).   PROBIOTIC DAILY PO Take 1 capsule by mouth daily as needed (For digestive health.).   rOPINIRole 1 MG tablet Commonly known as: REQUIP Take 1-2 tablets (1-2 mg total) by mouth daily as needed (for RLS).   sennosides-docusate sodium 8.6-50 MG tablet Commonly known as: SENOKOT-S Take 1-2 tablets by mouth daily as needed for constipation.   sulfamethoxazole-trimethoprim 800-160 MG tablet Commonly known as: BACTRIM DS Take 1 tablet by mouth every 12 (twelve) hours.   VISINE ADVANCED RELIEF OP Apply 1-2 drops to eye as needed (For eye irritation.).   Vitamin D 50 MCG (2000 UT) Caps Take 1 capsule by mouth daily.        Allergies: No Known Allergies  Family History: Family History  Problem Relation Age of Onset   Cancer Father 42       prostate Stage 4   Prostate cancer Father    Cancer Maternal Grandmother        breast   Cancer Mother    Cancer Paternal Grandmother        Colon   Colon cancer Paternal Grandmother     Social History:  reports that he has never smoked. He has never used smokeless tobacco. He reports current alcohol use of about 0.3 standard drinks of alcohol per week. He reports that he does not use drugs.   Physical Exam: BP (!) 165/78   Pulse 86   Ht 6' (1.829 m)   Wt 240 lb (108.9 kg)   BMI 32.55 kg/m   Constitutional:  Alert and oriented, No acute distress. GU: Site examined and no erythema, fluctuance, tenderness or drainage.  The site is indurated most likely due to cyst wall    Assessment & Plan:   Status post I&D scrotal abscess Options of observation versus excision of the cyst wall was discussed.  He has elected the former and will call should he experience any increased pain or recurrent drainage.   He was informed he could also call if he decided he wanted to have the cyst wall excised and will schedule   Abbie Sons, MD  [facility] [address] [place], [place] [address] [phone]

## 2022-02-05 ENCOUNTER — Ambulatory Visit: Payer: 59 | Admitting: Family Medicine

## 2022-02-07 ENCOUNTER — Encounter: Payer: Self-pay | Admitting: Family Medicine

## 2022-02-07 ENCOUNTER — Ambulatory Visit (INDEPENDENT_AMBULATORY_CARE_PROVIDER_SITE_OTHER): Payer: 59 | Admitting: Family Medicine

## 2022-02-07 VITALS — BP 128/80 | HR 72 | Temp 98.0°F | Ht 72.0 in | Wt 247.0 lb

## 2022-02-07 DIAGNOSIS — E559 Vitamin D deficiency, unspecified: Secondary | ICD-10-CM | POA: Diagnosis not present

## 2022-02-07 DIAGNOSIS — M109 Gout, unspecified: Secondary | ICD-10-CM

## 2022-02-07 MED ORDER — COLCHICINE 0.6 MG PO TABS: 0.6000 mg | ORAL_TABLET | Freq: Every day | ORAL | 0 refills | Status: DC | PRN

## 2022-02-07 NOTE — Progress Notes (Signed)
H/o scrotal wall abscess in 11/2021.  Had urology eval/tx.  Took mult courses of abx.  He improved in the meantime.  Took bactrim.  Had fever and aches.  Lasted 4-5 days and that was after antibiotic tx.  He was out of work for a few days.  Didn't have respiratory sx at that time.  He gradually improved.  Woke up 10 days ago with R toe sx.  Still faintly discolored at R medial 1st MTP.  Sore locally.  TTP locally.  Was pref red and puffy.  No other h/o gout.  He took tart cherry supplement in the meantime and that seemed to help.  RLS sx improved with requip but still having some sx.    H/o low vit D on replacement.  Due for f/u labs.    Meds, vitals, and allergies reviewed.   ROS: Per HPI unless specifically indicated in ROS section   Nad Ncat Neck supple, no LA Rrr Ctab R 1st MTP mildly pinkish but not overtly red.  Normal dorsalis pedis pulse.  Foot with normal inspection otherwise.

## 2022-02-07 NOTE — Patient Instructions (Signed)
Go to the lab on the way out.   If you have mychart we'll likely use that to update you.    Take care.  Glad to see you. Colchicine if needed.

## 2022-02-08 LAB — BASIC METABOLIC PANEL
BUN: 12 mg/dL (ref 6–23)
CO2: 30 mEq/L (ref 19–32)
Calcium: 9.1 mg/dL (ref 8.4–10.5)
Chloride: 102 mEq/L (ref 96–112)
Creatinine, Ser: 0.88 mg/dL (ref 0.40–1.50)
GFR: 94.96 mL/min (ref >60.00)
Glucose, Bld: 83 mg/dL (ref 70–99)
Potassium: 4.2 mEq/L (ref 3.5–5.1)
Sodium: 141 mEq/L (ref 135–145)

## 2022-02-08 LAB — VITAMIN D 25 HYDROXY (VIT D DEFICIENCY, FRACTURES): VITD: 40.94 ng/mL (ref 30.00–100.00)

## 2022-02-08 LAB — URIC ACID: Uric Acid, Serum: 6.9 mg/dL (ref 4.0–7.8)

## 2022-02-10 DIAGNOSIS — E559 Vitamin D deficiency, unspecified: Secondary | ICD-10-CM | POA: Insufficient documentation

## 2022-02-10 DIAGNOSIS — M109 Gout, unspecified: Secondary | ICD-10-CM | POA: Insufficient documentation

## 2022-02-10 NOTE — Assessment & Plan Note (Signed)
Could beLikely gout, could have been precipitated by previous antibiotic use/illness.  Idiopathic.  Discussed checking routine labs today.  Routine colchicine cautions given to patient.  He can use if needed.  He is improved in the meantime.  Reasonable to use tart cherry juice.

## 2022-02-10 NOTE — Assessment & Plan Note (Signed)
See notes on follow-up labs. 

## 2022-03-25 ENCOUNTER — Other Ambulatory Visit: Payer: Self-pay | Admitting: Family Medicine

## 2022-03-25 ENCOUNTER — Other Ambulatory Visit (HOSPITAL_COMMUNITY): Payer: Self-pay

## 2022-03-25 MED ORDER — CITALOPRAM HYDROBROMIDE 20 MG PO TABS
20.0000 mg | ORAL_TABLET | Freq: Every day | ORAL | 1 refills | Status: DC
  Filled 2022-03-25: qty 90, 90d supply, fill #0
  Filled 2022-05-13 – 2022-07-12 (×2): qty 90, 90d supply, fill #1

## 2022-03-25 MED ORDER — PANTOPRAZOLE SODIUM 20 MG PO TBEC
20.0000 mg | DELAYED_RELEASE_TABLET | Freq: Every day | ORAL | 1 refills | Status: DC
  Filled 2022-03-25: qty 90, 90d supply, fill #0
  Filled 2022-07-12: qty 90, 90d supply, fill #1

## 2022-03-28 ENCOUNTER — Other Ambulatory Visit (HOSPITAL_COMMUNITY): Payer: Self-pay

## 2022-04-09 ENCOUNTER — Ambulatory Visit: Payer: 59 | Admitting: Primary Care

## 2022-05-08 ENCOUNTER — Telehealth: Payer: 59 | Admitting: Physician Assistant

## 2022-05-08 DIAGNOSIS — L255 Unspecified contact dermatitis due to plants, except food: Secondary | ICD-10-CM | POA: Diagnosis not present

## 2022-05-08 NOTE — Progress Notes (Signed)
I have spent 5 minutes in review of e-visit questionnaire, review and updating patient chart, medical decision making and response to patient.   Etienne Millward Cody Servando Kyllonen, PA-C    

## 2022-05-08 NOTE — Progress Notes (Signed)
E-Visit for [employer]  We are sorry that you are not feeing well.  Here is how we plan to help!  Based on what you have shared with me it looks like you have had an allergic reaction to the oily resin from a group of plants.  This resin is very sticky, so it easily attaches to your skin, clothing, tools equipment, and pet's fur.    This blistering rash is often called poison ivy rash although it can come from contact with the leaves, stems and roots of poison ivy, poison oak and poison sumac.  The oily resin contains urushiol (u-ROO-she-ol) that produces a skin rash on exposed skin.  The severity of the rash depends on the amount of urushiol that gets on your skin.  A section of skin with more urushiol on it may develop a rash sooner.  The rash usually develops 12-48 hours after exposure and can last two to three weeks.  Your skin must come in direct contact with the plant's oil to be affected.  Blister fluid doesn't spread the rash.  However, if you come into contact with a piece of clothing or pet fur that has urushiol on it, the rash may spread out.  You can also transfer the oil to other parts of your body with your fingers.  Often the rash looks like a straight line because of the way the plant brushes against your skin.  Since your rash is widespread or has resulted in a large number of blisters, I have prescribed an oral corticosteroid.  Please follow these recommendations:  I have sent a prednisone dose pack to your chosen pharmacy. Be sure to follow the instructions carefully and complete the entire prescription. You may use Benadryl or Caladryl topical lotions to sooth the itch and remember cool, not hot, showers and baths can help relieve the itching!  Place cool, wet compresses on the affected area for 15-30 minutes several times a day.  You may also take oral antihistamines, such as diphenhydramine (Benadryl, others), which may also help you sleep better.  Watch your skin for any purulent  (pus) drainage or red streaking from the site.  If this occurs, contact your provider.  You may require an antibiotic for a skin infection.  Make sure that the clothes you were wearing as well as any towels or sheets that may have come in contact with the oil (urushiol) are washed in detergent and hot water.       I have developed the following plan to treat your condition I am prescribing a two week course of steroids (37 tablets of 10 mg prednisone).  Days 1-4 take 4 tablets (40 mg) daily  Days 5-8 take 3 tablets (30 mg) daily, Days 9-11 take 2 tablets (20 mg) daily, Days 12-14 take 1 tablet (10 mg) daily.    What can you do to prevent this rash?  Avoid the plants.  Learn how to identify poison ivy, poison oak and poison sumac in all seasons.  When hiking or engaging in other activities that might expose you to these plants, try to stay on cleared pathways.  If camping, make sure you pitch your tent in an area free of these plants.  Keep pets from running through wooded areas so that urushiol doesn't accidentally stick to their fur, which you may touch.  Remove or kill the plants.  In your yard, you can get rid of poison ivy by applying an herbicide or pulling it out of  the ground, including the roots, while wearing heavy gloves.  Afterward remove the gloves and thoroughly wash them and your hands.  Don't burn poison ivy or related plants because the urushiol can be carried by smoke.  Wear protective clothing.  If needed, protect your skin by wearing socks, boots, pants, long sleeves and vinyl gloves.  Wash your skin right away.  Washing off the oil with soap and water within 30 minutes of exposure may reduce your chances of getting a poison ivy rash.  Even washing after an hour or so can help reduce the severity of the rash.  If you walk through some poison ivy and then later touch your shoes, you may get some urushiol on your hands, which may then transfer to your face or body by touching or  rubbing.  If the contaminated object isn't cleaned, the urushiol on it can still cause a skin reaction years later.    Be careful not to reuse towels after you have washed your skin.  Also carefully wash clothing in detergent and hot water to remove all traces of the oil.  Handle contaminated clothing carefully so you don't transfer the urushiol to yourself, furniture, rugs or appliances.  Remember that pets can carry the oil on their fur and paws.  If you think your pet may be contaminated with urushiol, put on some long rubber gloves and give your pet a bath.  Finally, be careful not to burn these plants as the smoke can contain traces of the oil.  Inhaling the smoke may result in difficulty breathing. If that occurred you should see a physician as soon as possible.  See your doctor right away if:  The reaction is severe or widespread You inhaled the smoke from burning poison ivy and are having difficulty breathing Your skin continues to swell The rash affects your eyes, mouth or genitals Blisters are oozing pus You develop a fever greater than 100 F (37.8 C) The rash doesn't get better within a few weeks.  If you scratch the poison ivy rash, bacteria under your fingernails may cause the skin to become infected.  See your doctor if pus starts oozing from the blisters.  Treatment generally includes antibiotics.  Poison ivy treatments are usually limited to self-care methods.  And the rash typically goes away on its own in two to three weeks.     If the rash is widespread or results in a large number of blisters, your doctor may prescribe an oral corticosteroid, such as prednisone.  If a bacterial infection has developed at the rash site, your doctor may give you a prescription for an oral antibiotic.  MAKE SURE YOU  Understand these instructions. Will watch your condition. Will get help right away if you are not doing well or get worse.   Thank you for choosing an e-visit.  Your  e-visit answers were reviewed by a board certified advanced clinical practitioner to complete your personal care plan. Depending upon the condition, your plan could have included both over the counter or prescription medications.  Please review your pharmacy choice. Make sure the pharmacy is open so you can pick up prescription now. If there is a problem, you may contact your provider through [employer] and have the prescription routed to another pharmacy.  Your safety is important to [place]. If you have drug allergies check your prescription carefully.   For the next 24 hours you can use MyChart to ask questions about today's visit, request a non-urgent  call back, or ask for a work or school excuse. You will get an email in the next two days asking about your experience. I hope that your e-visit has been valuable and will speed your recovery.

## 2022-05-09 MED ORDER — PREDNISONE 10 MG PO TABS: ORAL_TABLET | ORAL | 0 refills | Status: AC

## 2022-05-09 NOTE — Addendum Note (Signed)
Addended by: Brunetta Jeans on: 05/09/2022 06:58 AM   Modules accepted: Orders

## 2022-05-13 ENCOUNTER — Other Ambulatory Visit (HOSPITAL_COMMUNITY): Payer: Self-pay

## 2022-05-13 ENCOUNTER — Other Ambulatory Visit: Payer: Self-pay | Admitting: Family Medicine

## 2022-05-13 DIAGNOSIS — R42 Dizziness and giddiness: Secondary | ICD-10-CM

## 2022-05-13 DIAGNOSIS — G2581 Restless legs syndrome: Secondary | ICD-10-CM

## 2022-05-13 MED ORDER — ROPINIROLE HCL 1 MG PO TABS
1.0000 mg | ORAL_TABLET | Freq: Every day | ORAL | 3 refills | Status: DC | PRN
  Filled 2022-05-13 – 2022-07-12 (×2): qty 180, 90d supply, fill #0
  Filled 2022-10-03: qty 180, 90d supply, fill #1

## 2022-07-12 ENCOUNTER — Other Ambulatory Visit (HOSPITAL_COMMUNITY): Payer: Self-pay

## 2022-07-17 ENCOUNTER — Encounter: Payer: Self-pay | Admitting: Urology

## 2022-07-17 ENCOUNTER — Ambulatory Visit (INDEPENDENT_AMBULATORY_CARE_PROVIDER_SITE_OTHER): Payer: 59 | Admitting: Urology

## 2022-07-17 VITALS — BP 182/93 | HR 67 | Ht 74.0 in | Wt 240.0 lb

## 2022-07-17 DIAGNOSIS — N492 Inflammatory disorders of scrotum: Secondary | ICD-10-CM

## 2022-07-17 MED ORDER — SULFAMETHOXAZOLE-TRIMETHOPRIM 800-160 MG PO TABS: 1.0000 | ORAL_TABLET | Freq: Two times a day (BID) | ORAL | 0 refills | Status: AC

## 2022-07-17 NOTE — Progress Notes (Signed)
07/17/2022 10:45 AM   Patrick Oconnor [birth year] [id number]  Referring provider: Tonia Ghent, MD [place],  [place] [address]  Chief Complaint  Patient presents with   Other    HPI: 56 y.o. 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  Presents today with a ~ 2-week history of left hemiscrotal pain and swelling No fever or chills; severity mild-moderate No voiding complaints   PMH: Past Medical History:  Diagnosis Date   Anemia    Asthma, exercise induced    Degenerative disc disease, lumbar    Depression    Environmental allergies    GERD (gastroesophageal reflux disease)    History of bronchitis    Hypertension    IBS (irritable bowel syndrome)    Pneumonia    hx of   PONV (postoperative nausea and vomiting)    Sleep apnea 04/2007   Started CPAP; wears CPAP nightly   Ulcerative colitis [facility])     Surgical History: Past Surgical History:  Procedure Laterality Date   ACROMIO-CLAVICULAR JOINT REPAIR Right 04/15/2013   Procedure: RIGHT SHOULDER ACROMIO-CLAVICULAR RECONSTRUCTION WITH ALLOGRAFT   ;  Surgeon: Marin Shutter, MD;  Location: [place];  Service: Orthopedics;  Laterality: Right;   ACROMIO-CLAVICULAR JOINT REPAIR Right 06/03/2013   Procedure: ACROMIO-CLAVICULAR JOINT REPAIR;  Surgeon: Marin Shutter, MD;  Location: [place];  Service: Orthopedics;  Laterality: Right;   ACROMIO-CLAVICULAR JOINT REPAIR Right 10/19/2015   Procedure: OPEN RIGHT SHOULDER ACROMIO-CLAVICULAR JOINT RECONSTRUCTION ;  Surgeon: Justice Britain, MD;  Location: [place];  Service: Orthopedics;  Laterality: Right;   COLONOSCOPY  08/12/2005   "multiple"   ESOPHAGOGASTRODUODENOSCOPY  3/09   normal; multiple   ESOPHAGOGASTRODUODENOSCOPY (EGD) WITH PROPOFOL N/A 01/22/2016   Procedure: ESOPHAGOGASTRODUODENOSCOPY (EGD) WITH PROPOFOL;  Surgeon: Laurence Spates, MD;  [facility];  Service: Endoscopy;  Laterality: N/A;   INCISION AND DRAINAGE Right  06/03/2013   Procedure: INCISION AND DRAINAGE RIGHT SHOULDER;  Surgeon: Marin Shutter, MD;  Location: [place];  Service: Orthopedics;  Laterality: Right;   pilonidal cystectomy  42003   x 2   PYLOROPLASTY  infant   pyloric stenosis   SEPTOPLASTY     SHOULDER OPEN ROTATOR CUFF REPAIR Right 10/19/2015   Procedure: OPEN RIGHT SHOULDER ROTATOR CUFF REPAIR ;  Surgeon: Justice Britain, MD;  Location: [place];  Service: Orthopedics;  Laterality: Right;   thrombosed external hemorrhoid  9/08   incised   TONSILLECTOMY AND ADENOIDECTOMY      Home Medications:  Allergies as of 07/17/2022   No Known Allergies      Medication List        Accurate as of July 17, 2022 10:45 AM. If you have any questions, ask your nurse or doctor.          albuterol 108 (90 Base) MCG/ACT inhaler Commonly known as: VENTOLIN HFA Inhale 1-2 puffs into the lungs every 6 (six) hours as needed for wheezing (prior to exercise.).   citalopram 20 MG tablet Commonly known as: CELEXA Take 1 tablet (20 mg total) by mouth daily.   colchicine 0.6 MG tablet Take 1 tablet (0.6 mg total) by mouth daily as needed. Okay to fill with mitigare, colchicine, colcrys.  Take with food.   Dulcolax 5 MG EC tablet Generic drug: bisacodyl   ferrous sulfate 325 (65 FE) MG tablet Take 1 tablet (325 mg total) by mouth daily.   fluticasone 50 MCG/ACT nasal spray Commonly known  as: FLONASE Place 1 spray into both nostrils daily.   loratadine 10 MG tablet Commonly known as: CLARITIN Take 10 mg by mouth daily.   mesalamine 0.375 g 24 hr capsule Commonly known as: APRISO Take 4 capsules (1.5 g total) by mouth every morning.   pantoprazole 20 MG tablet Commonly known as: PROTONIX Take 1 tablet (20 mg total) by mouth daily.   polyethylene glycol powder 17 GM/SCOOP powder Commonly known as: GLYCOLAX/MIRALAX Take 17 g by mouth 2 (two) times daily as needed (For constipation.).   PROBIOTIC DAILY PO Take 1 capsule by mouth daily  as needed (For digestive health.).   rOPINIRole 1 MG tablet Commonly known as: REQUIP Take 1-2 tablets (1-2 mg total) by mouth daily as needed (for RLS).   sennosides-docusate sodium 8.6-50 MG tablet Commonly known as: SENOKOT-S Take 1-2 tablets by mouth daily as needed for constipation.   VISINE ADVANCED RELIEF OP Apply 1-2 drops to eye as needed (For eye irritation.).   Vitamin D 50 MCG (2000 UT) Caps Take 1 capsule by mouth daily.        Allergies: No Known Allergies  Family History: Family History  Problem Relation Age of Onset   Cancer Father 41       prostate Stage 4   Prostate cancer Father    Cancer Maternal Grandmother        breast   Cancer Mother    Cancer Paternal Grandmother        Colon   Colon cancer Paternal Grandmother     Social History:  reports that he has never smoked. He has never used smokeless tobacco. He reports current alcohol use of about 0.3 standard drinks of alcohol per week. He reports that he does not use drugs.   Physical Exam: BP (!) 182/93   Pulse 67   Ht 6' 2"  (1.88 m)   Wt 240 lb (108.9 kg)   BMI 30.81 kg/m   Constitutional:  Alert and oriented, No acute distress. HEENT: [place] AT GU: Lateral to the inferior portion of the left hemiscrotum is a 2 x 4 cm area of fluctuance without erythema some induration and mild erythema superiorly.  Mild tenderness Neurologic: Grossly intact, no focal deficits, moving all 4 extremities. Psychiatric: Normal mood and affect.   Assessment & Plan:    1.  Recurrent left hemiscrotal abscess That is post I&D and OR.  Symptoms have been present for 2 weeks and he states he is extremely busy at work.  He has next Thursday through Sunday off which would work best with his schedule. Will start Septra DS and instruct to call earlier should he develop worsening symptoms or fever The procedure was discussed including potential risks of bleeding, sepsis and recurrence   Abbie Sons, MD  [facility] [address] [place], [place] [address] [phone]

## 2022-07-17 NOTE — H&P (View-Only) (Signed)
07/17/2022 10:45 AM   Tessie Fass [place] 10-11-1965 [id number]  Referring provider: Tonia Ghent, MD [place],  [place] [address]  Chief Complaint  Patient presents with   Other    HPI: 56 y.o. 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  Presents today with a ~ 2-week history of left hemiscrotal pain and swelling No fever or chills; severity mild-moderate No voiding complaints   PMH: Past Medical History:  Diagnosis Date   Anemia    Asthma, exercise induced    Degenerative disc disease, lumbar    Depression    Environmental allergies    GERD (gastroesophageal reflux disease)    History of bronchitis    Hypertension    IBS (irritable bowel syndrome)    Pneumonia    hx of   PONV (postoperative nausea and vomiting)    Sleep apnea 04/2007   Started CPAP; wears CPAP nightly   Ulcerative colitis [facility])     Surgical History: Past Surgical History:  Procedure Laterality Date   ACROMIO-CLAVICULAR JOINT REPAIR Right 04/15/2013   Procedure: RIGHT SHOULDER ACROMIO-CLAVICULAR RECONSTRUCTION WITH ALLOGRAFT   ;  Surgeon: Marin Shutter, MD;  Location: [place];  Service: Orthopedics;  Laterality: Right;   ACROMIO-CLAVICULAR JOINT REPAIR Right 06/03/2013   Procedure: ACROMIO-CLAVICULAR JOINT REPAIR;  Surgeon: Marin Shutter, MD;  Location: [place];  Service: Orthopedics;  Laterality: Right;   ACROMIO-CLAVICULAR JOINT REPAIR Right 10/19/2015   Procedure: OPEN RIGHT SHOULDER ACROMIO-CLAVICULAR JOINT RECONSTRUCTION ;  Surgeon: Justice Britain, MD;  Location: [place];  Service: Orthopedics;  Laterality: Right;   COLONOSCOPY  08/12/2005   "multiple"   ESOPHAGOGASTRODUODENOSCOPY  3/09   normal; multiple   ESOPHAGOGASTRODUODENOSCOPY (EGD) WITH PROPOFOL N/A 01/22/2016   Procedure: ESOPHAGOGASTRODUODENOSCOPY (EGD) WITH PROPOFOL;  Surgeon: Laurence Spates, MD;  [facility];  Service: Endoscopy;  Laterality: N/A;   INCISION AND DRAINAGE Right  06/03/2013   Procedure: INCISION AND DRAINAGE RIGHT SHOULDER;  Surgeon: Marin Shutter, MD;  Location: [place];  Service: Orthopedics;  Laterality: Right;   pilonidal cystectomy  42003   x 2   PYLOROPLASTY  infant   pyloric stenosis   SEPTOPLASTY     SHOULDER OPEN ROTATOR CUFF REPAIR Right 10/19/2015   Procedure: OPEN RIGHT SHOULDER ROTATOR CUFF REPAIR ;  Surgeon: Justice Britain, MD;  Location: [place];  Service: Orthopedics;  Laterality: Right;   thrombosed external hemorrhoid  9/08   incised   TONSILLECTOMY AND ADENOIDECTOMY      Home Medications:  Allergies as of 07/17/2022   No Known Allergies      Medication List        Accurate as of July 17, 2022 10:45 AM. If you have any questions, ask your nurse or doctor.          albuterol 108 (90 Base) MCG/ACT inhaler Commonly known as: VENTOLIN HFA Inhale 1-2 puffs into the lungs every 6 (six) hours as needed for wheezing (prior to exercise.).   citalopram 20 MG tablet Commonly known as: CELEXA Take 1 tablet (20 mg total) by mouth daily.   colchicine 0.6 MG tablet Take 1 tablet (0.6 mg total) by mouth daily as needed. Okay to fill with mitigare, colchicine, colcrys.  Take with food.   Dulcolax 5 MG EC tablet Generic drug: bisacodyl   ferrous sulfate 325 (65 FE) MG tablet Take 1 tablet (325 mg total) by mouth daily.   fluticasone 50 MCG/ACT nasal spray Commonly known  as: FLONASE Place 1 spray into both nostrils daily.   loratadine 10 MG tablet Commonly known as: CLARITIN Take 10 mg by mouth daily.   mesalamine 0.375 g 24 hr capsule Commonly known as: APRISO Take 4 capsules (1.5 g total) by mouth every morning.   pantoprazole 20 MG tablet Commonly known as: PROTONIX Take 1 tablet (20 mg total) by mouth daily.   polyethylene glycol powder 17 GM/SCOOP powder Commonly known as: GLYCOLAX/MIRALAX Take 17 g by mouth 2 (two) times daily as needed (For constipation.).   PROBIOTIC DAILY PO Take 1 capsule by mouth daily  as needed (For digestive health.).   rOPINIRole 1 MG tablet Commonly known as: REQUIP Take 1-2 tablets (1-2 mg total) by mouth daily as needed (for RLS).   sennosides-docusate sodium 8.6-50 MG tablet Commonly known as: SENOKOT-S Take 1-2 tablets by mouth daily as needed for constipation.   VISINE ADVANCED RELIEF OP Apply 1-2 drops to eye as needed (For eye irritation.).   Vitamin D 50 MCG (2000 UT) Caps Take 1 capsule by mouth daily.        Allergies: No Known Allergies  Family History: Family History  Problem Relation Age of Onset   Cancer Father 71       prostate Stage 4   Prostate cancer Father    Cancer Maternal Grandmother        breast   Cancer Mother    Cancer Paternal Grandmother        Colon   Colon cancer Paternal Grandmother     Social History:  reports that he has never smoked. He has never used smokeless tobacco. He reports current alcohol use of about 0.3 standard drinks of alcohol per week. He reports that he does not use drugs.   Physical Exam: BP (!) 182/93   Pulse 67   Ht 6' 2"  (1.88 m)   Wt 240 lb (108.9 kg)   BMI 30.81 kg/m   Constitutional:  Alert and oriented, No acute distress. HEENT: [place] AT GU: Lateral to the inferior portion of the left hemiscrotum is a 2 x 4 cm area of fluctuance without erythema some induration and mild erythema superiorly.  Mild tenderness Neurologic: Grossly intact, no focal deficits, moving all 4 extremities. Psychiatric: Normal mood and affect.   Assessment & Plan:    1.  Recurrent left hemiscrotal abscess That is post I&D and OR.  Symptoms have been present for 2 weeks and he states he is extremely busy at work.  He has next Thursday through Sunday off which would work best with his schedule. Will start Septra DS and instruct to call earlier should he develop worsening symptoms or fever The procedure was discussed including potential risks of bleeding, sepsis and recurrence   Abbie Sons, MD  [facility] [address], [place], [place] [address] [phone]

## 2022-07-18 ENCOUNTER — Telehealth: Payer: Self-pay

## 2022-07-18 ENCOUNTER — Encounter: Payer: Self-pay | Admitting: Urology

## 2022-07-18 ENCOUNTER — Other Ambulatory Visit: Payer: Self-pay | Admitting: Urology

## 2022-07-18 DIAGNOSIS — N5089 Other specified disorders of the male genital organs: Secondary | ICD-10-CM

## 2022-07-18 NOTE — Telephone Encounter (Signed)
Left message for patient to call back to schedule surgery

## 2022-07-18 NOTE — Progress Notes (Signed)
Surgical Physician [facility]  * Scheduling expectation : Thursday, 07/25/2022  *Length of Case: 30 minutes  *Clearance needed: no  *Anticoagulation Instructions: N/A  *Aspirin Instructions: N/A  *Post-op visit Date/Instructions:   PA follow-up 12/15 for dressing change  *Diagnosis: Scrotal abscess  *Procedure: Incision and drainage scrotal abscess   Additional orders: N/A  -Admit type: OUTpatient  -Anesthesia: Choice  -VTE Prophylaxis Standing Order SCD's       Other:   -Standing Lab Orders Per Anesthesia    Lab other: None  -Standing Test orders EKG/Chest x-ray per Anesthesia       Test other:   - Medications:  Ancef 2gm IV  -Other orders:  N/A

## 2022-07-19 ENCOUNTER — Telehealth: Payer: Self-pay

## 2022-07-19 NOTE — Progress Notes (Signed)
   [facility] From  Surgery Date: Date: 07/25/2022  Surgeon: Dr. John Giovanni, MD  Inpt ( No  )   Outpt (Yes)   Obs ( No  )   Diagnosis: N50.89 Scrotal Abscess  -CPT: 55100  Surgery: Incision and Drainage of Scrotal Abscess  Stop Anticoagulations: N/A  Cardiac/Medical/Pulmonary Clearance needed: no  *Orders entered into EPIC  Date: 07/19/22   *Case booked in EPIC  Date: 07/19/22  *Notified pt of Surgery: Date: 07/19/22  PRE-OP UA & CX: no  *Placed into Prior Authorization Work [place] Date: 07/19/22  Assistant/laser/rep:No

## 2022-07-19 NOTE — Telephone Encounter (Signed)
I spoke with Patrick Oconnor. We have discussed possible surgery dates and Thursday December 14th, 2023 was agreed upon by all parties. Patient given information about surgery date, what to expect pre-operatively and post operatively.  We discussed that a Pre-Admission Testing office will be calling to set up the pre-op visit that will take place prior to surgery, and that these appointments are typically done over the phone with a Pre-Admissions RN.  Informed patient that our office will communicate any additional care to be provided after surgery. Patients questions or concerns were discussed during our call. Advised to call our office should there be any additional information, questions or concerns that arise. Patient verbalized understanding.

## 2022-07-23 ENCOUNTER — Encounter: Payer: Self-pay | Admitting: Urgent Care

## 2022-07-23 ENCOUNTER — Encounter
Admission: RE | Admit: 2022-07-23 | Discharge: 2022-07-23 | Disposition: A | Discharge status: Home / Self Care | Payer: 59 | Source: Ambulatory Visit | Attending: Urology | Admitting: Urology

## 2022-07-23 VITALS — BP 152/89

## 2022-07-23 DIAGNOSIS — R001 Bradycardia, unspecified: Secondary | ICD-10-CM | POA: Diagnosis not present

## 2022-07-23 DIAGNOSIS — Z01812 Encounter for preprocedural laboratory examination: Secondary | ICD-10-CM

## 2022-07-23 DIAGNOSIS — Z01818 Encounter for other preprocedural examination: Secondary | ICD-10-CM | POA: Insufficient documentation

## 2022-07-23 HISTORY — DX: Tinnitus, left ear: H93.12

## 2022-07-23 LAB — CBC
HCT: 39.4 % (ref 39.0–52.0)
Hemoglobin: 13 g/dL (ref 13.0–17.0)
MCH: 28.3 pg (ref 26.0–34.0)
MCHC: 33 g/dL (ref 30.0–36.0)
MCV: 85.8 fL (ref 80.0–100.0)
Platelets: 155 10*3/uL (ref 150–400)
RBC: 4.59 MIL/uL (ref 4.22–5.81)
RDW: 12.6 % (ref 11.5–15.5)
WBC: 5.6 10*3/uL (ref 4.0–10.5)
nRBC: 0 % (ref 0.0–0.2)

## 2022-07-23 LAB — BASIC METABOLIC PANEL
Anion gap: 7 (ref 5–15)
BUN: 19 mg/dL (ref 6–20)
CO2: 26 mmol/L (ref 22–32)
Calcium: 8.6 mg/dL — ABNORMAL LOW (ref 8.9–10.3)
Chloride: 105 mmol/L (ref 98–111)
Creatinine, Ser: 0.95 mg/dL (ref 0.61–1.24)
GFR, Estimated: >60 mL/min (ref >60)
Glucose, Bld: 95 mg/dL (ref 70–99)
Potassium: 4.4 mmol/L (ref 3.5–5.1)
Sodium: 138 mmol/L (ref 135–145)

## 2022-07-23 NOTE — Patient Instructions (Addendum)
Your procedure is scheduled on: 07/25/22 - Thursday Report to the Registration Desk on the 1st floor of the [facility]. To find out your arrival time, please call [phone] between 1PM - 3PM on: 07/24/22 - Wednesday If your arrival time is 6:00 am, do not arrive prior to that time as the [facility] entrance doors do not open until 6:00 am.  REMEMBER: Instructions that are not followed completely may result in serious medical risk, up to and including death; or upon the discretion of your surgeon and anesthesiologist your surgery may need to be rescheduled.  Do not eat food or drink any liquids after midnight the night before surgery.  No gum chewing, lozengers or hard candies.   TAKE THESE MEDICATIONS THE MORNING OF SURGERY WITH A SIP OF WATER:  - albuterol (VENTOLIN HFA) use the day of surgery and bring to the hospital with you. - pantoprazole (PROTONIX)  - sulfamethoxazole-trimethoprim (BACTRIM DS)   One week prior to surgery: Stop Anti-inflammatories (NSAIDS) such as Advil, Aleve, Ibuprofen, Motrin, Naproxen, Naprosyn and Aspirin based products such as Excedrin, Goodys Powder, BC Powder.  Stop ANY OVER THE COUNTER supplements until after surgery.  You may take Tylenol if needed for pain up until the day of surgery.  No Alcohol for 24 hours before or after surgery.  No Smoking including e-cigarettes for 24 hours prior to surgery.  No chewable tobacco products for at least 6 hours prior to surgery.  No nicotine patches on the day of surgery.  Do not use any "recreational" drugs for at least a week prior to your surgery.  Please be advised that the combination of cocaine and anesthesia may have negative outcomes, up to and including death. If you test positive for cocaine, your surgery will be cancelled.  On the morning of surgery brush your teeth with toothpaste and water, you may rinse your mouth with mouthwash if you wish. Do not swallow any toothpaste or  mouthwash.  Do not wear jewelry, make-up, hairpins, clips or nail polish.  Do not wear lotions, powders, or perfumes.   Do not shave body from the neck down 48 hours prior to surgery just in case you cut yourself which could leave a site for infection.  Also, freshly shaved skin may become irritated if using the CHG soap.  Contact lenses, hearing aids and dentures may not be worn into surgery.  Do not bring valuables to the hospital. [facility] is not responsible for any missing/lost belongings or valuables.   Bring your C-PAP to the hospital with you in case you may have to spend the night.   Notify your doctor if there is any change in your medical condition (cold, fever, infection).  Wear comfortable clothing (specific to your surgery type) to the hospital.  After surgery, you can help prevent lung complications by doing breathing exercises.  Take deep breaths and cough every 1-2 hours. Your doctor may order a device called an Incentive Spirometer to help you take deep breaths. When coughing or sneezing, hold a pillow firmly against your incision with both hands. This is called "splinting." Doing this helps protect your incision. It also decreases belly discomfort.  If you are being admitted to the hospital overnight, leave your suitcase in the car. After surgery it may be brought to your room.  If you are being discharged the day of surgery, you will not be allowed to drive home. You will need a responsible adult (18 years or older) to drive you  home and stay with you that night.   If you are taking public transportation, you will need to have a responsible adult (18 years or older) with you. Please confirm with your physician that it is acceptable to use public transportation.   Please call the [facility] Dept. at [phone] if you have any questions about these instructions.  Surgery Visitation Policy:  Patients undergoing a surgery or procedure may have two  family members or support persons with them as long as the person is not COVID-19 positive or experiencing its symptoms.   Inpatient Visitation:    Visiting hours are 7 a.m. to 8 p.m. Up to four visitors are allowed at one time in a patient room. The visitors may rotate out with other people during the day. One designated support person (adult) may remain overnight.  Due to an increase in RSV and influenza rates and associated hospitalizations, children ages 7 and under will not be able to visit patients in [facility]. Masks continue to be strongly recommended.

## 2022-07-25 ENCOUNTER — Ambulatory Visit
Admission: RE | Admit: 2022-07-25 | Discharge: 2022-07-25 | Disposition: A | Discharge status: Home / Self Care | Payer: 59 | Attending: Urology | Admitting: Urology

## 2022-07-25 ENCOUNTER — Ambulatory Visit: Payer: 59 | Admitting: Urgent Care

## 2022-07-25 ENCOUNTER — Encounter: Payer: Self-pay | Admitting: Urology

## 2022-07-25 ENCOUNTER — Encounter
Admission: RE | Disposition: A | Discharge status: Home / Self Care | Payer: Self-pay | Source: Home / Self Care | Attending: Urology

## 2022-07-25 ENCOUNTER — Other Ambulatory Visit: Payer: Self-pay

## 2022-07-25 ENCOUNTER — Ambulatory Visit: Payer: 59 | Admitting: Certified Registered"

## 2022-07-25 DIAGNOSIS — B957 Other staphylococcus as the cause of diseases classified elsewhere: Secondary | ICD-10-CM | POA: Diagnosis not present

## 2022-07-25 DIAGNOSIS — R001 Bradycardia, unspecified: Secondary | ICD-10-CM | POA: Insufficient documentation

## 2022-07-25 DIAGNOSIS — J45909 Unspecified asthma, uncomplicated: Secondary | ICD-10-CM | POA: Insufficient documentation

## 2022-07-25 DIAGNOSIS — K219 Gastro-esophageal reflux disease without esophagitis: Secondary | ICD-10-CM | POA: Insufficient documentation

## 2022-07-25 DIAGNOSIS — R519 Headache, unspecified: Secondary | ICD-10-CM | POA: Diagnosis not present

## 2022-07-25 DIAGNOSIS — I10 Essential (primary) hypertension: Secondary | ICD-10-CM | POA: Diagnosis not present

## 2022-07-25 DIAGNOSIS — Z6830 Body mass index (BMI) 30.0-30.9, adult: Secondary | ICD-10-CM | POA: Insufficient documentation

## 2022-07-25 DIAGNOSIS — M1990 Unspecified osteoarthritis, unspecified site: Secondary | ICD-10-CM | POA: Insufficient documentation

## 2022-07-25 DIAGNOSIS — N492 Inflammatory disorders of scrotum: Secondary | ICD-10-CM | POA: Insufficient documentation

## 2022-07-25 DIAGNOSIS — F32A Depression, unspecified: Secondary | ICD-10-CM | POA: Diagnosis not present

## 2022-07-25 DIAGNOSIS — G4730 Sleep apnea, unspecified: Secondary | ICD-10-CM | POA: Diagnosis not present

## 2022-07-25 DIAGNOSIS — E669 Obesity, unspecified: Secondary | ICD-10-CM | POA: Insufficient documentation

## 2022-07-25 DIAGNOSIS — K5190 Ulcerative colitis, unspecified, without complications: Secondary | ICD-10-CM | POA: Insufficient documentation

## 2022-07-25 DIAGNOSIS — D649 Anemia, unspecified: Secondary | ICD-10-CM | POA: Diagnosis not present

## 2022-07-25 DIAGNOSIS — N5089 Other specified disorders of the male genital organs: Secondary | ICD-10-CM

## 2022-07-25 HISTORY — PX: INCISION AND DRAINAGE ABSCESS: SHX5864

## 2022-07-25 SURGERY — INCISION AND DRAINAGE, ABSCESS
Anesthesia: General

## 2022-07-25 MED ORDER — DEXAMETHASONE SODIUM PHOSPHATE 10 MG/ML IJ SOLN
INTRAMUSCULAR | Status: DC | PRN
  Administered 2022-07-25: 10 mg via INTRAVENOUS

## 2022-07-25 MED ORDER — ACETAMINOPHEN 10 MG/ML IV SOLN: 1000.0000 mg | Freq: Once | INTRAVENOUS | Status: DC | PRN

## 2022-07-25 MED ORDER — LACTATED RINGERS IV SOLN: INTRAVENOUS | Status: DC

## 2022-07-25 MED ORDER — EPHEDRINE SULFATE (PRESSORS) 50 MG/ML IJ SOLN
INTRAMUSCULAR | Status: DC | PRN
  Administered 2022-07-25: 10 mg via INTRAVENOUS

## 2022-07-25 MED ORDER — LIDOCAINE HCL (CARDIAC) PF 100 MG/5ML IV SOSY
PREFILLED_SYRINGE | INTRAVENOUS | Status: DC | PRN
  Administered 2022-07-25: 50 mg via INTRAVENOUS

## 2022-07-25 MED ORDER — OXYCODONE HCL 5 MG PO TABS
ORAL_TABLET | ORAL | Status: AC
  Filled 2022-07-25: qty 1

## 2022-07-25 MED ORDER — ONDANSETRON HCL 4 MG/2ML IJ SOLN
INTRAMUSCULAR | Status: DC | PRN
  Administered 2022-07-25: 4 mg via INTRAVENOUS

## 2022-07-25 MED ORDER — CHLORHEXIDINE GLUCONATE 0.12 % MT SOLN: 15.0000 mL | Freq: Once | OROMUCOSAL | Status: AC

## 2022-07-25 MED ORDER — KETAMINE HCL 10 MG/ML IJ SOLN
INTRAMUSCULAR | Status: DC | PRN
  Administered 2022-07-25: 30 mg via INTRAVENOUS

## 2022-07-25 MED ORDER — MIDAZOLAM HCL 2 MG/2ML IJ SOLN
INTRAMUSCULAR | Status: AC
  Filled 2022-07-25: qty 2

## 2022-07-25 MED ORDER — BUPIVACAINE HCL (PF) 0.5 % IJ SOLN
INTRAMUSCULAR | Status: AC
  Filled 2022-07-25: qty 30

## 2022-07-25 MED ORDER — OXYCODONE HCL 5 MG/5ML PO SOLN: 5.0000 mg | Freq: Once | ORAL | Status: AC | PRN

## 2022-07-25 MED ORDER — CEFAZOLIN SODIUM-DEXTROSE 2-4 GM/100ML-% IV SOLN
INTRAVENOUS | Status: AC
  Filled 2022-07-25: qty 100

## 2022-07-25 MED ORDER — FENTANYL CITRATE (PF) 100 MCG/2ML IJ SOLN
INTRAMUSCULAR | Status: DC | PRN
  Administered 2022-07-25: 50 ug via INTRAVENOUS

## 2022-07-25 MED ORDER — PROPOFOL 10 MG/ML IV BOLUS
INTRAVENOUS | Status: DC | PRN
  Administered 2022-07-25: 200 mg via INTRAVENOUS

## 2022-07-25 MED ORDER — GLYCOPYRROLATE 0.2 MG/ML IJ SOLN
INTRAMUSCULAR | Status: DC | PRN
  Administered 2022-07-25: .2 mg via INTRAVENOUS

## 2022-07-25 MED ORDER — DEXMEDETOMIDINE HCL IN NACL 80 MCG/20ML IV SOLN
INTRAVENOUS | Status: DC | PRN
  Administered 2022-07-25: 12 ug via BUCCAL

## 2022-07-25 MED ORDER — FENTANYL CITRATE (PF) 100 MCG/2ML IJ SOLN
INTRAMUSCULAR | Status: AC
  Filled 2022-07-25: qty 2

## 2022-07-25 MED ORDER — KETAMINE HCL 50 MG/5ML IJ SOSY
PREFILLED_SYRINGE | INTRAMUSCULAR | Status: AC
  Filled 2022-07-25: qty 5

## 2022-07-25 MED ORDER — HYDROCODONE-ACETAMINOPHEN 5-325 MG PO TABS: 1.0000 | ORAL_TABLET | Freq: Four times a day (QID) | ORAL | 0 refills | Status: DC | PRN

## 2022-07-25 MED ORDER — LIDOCAINE HCL (PF) 1 % IJ SOLN
INTRAMUSCULAR | Status: AC
  Filled 2022-07-25: qty 30

## 2022-07-25 MED ORDER — SCOPOLAMINE 1 MG/3DAYS TD PT72
1.0000 | MEDICATED_PATCH | TRANSDERMAL | Status: DC
  Administered 2022-07-25: 1.5 mg via TRANSDERMAL

## 2022-07-25 MED ORDER — CEFAZOLIN SODIUM-DEXTROSE 2-4 GM/100ML-% IV SOLN
2.0000 g | INTRAVENOUS | Status: AC
  Administered 2022-07-25: 2 g via INTRAVENOUS

## 2022-07-25 MED ORDER — SCOPOLAMINE 1 MG/3DAYS TD PT72
MEDICATED_PATCH | TRANSDERMAL | Status: AC
  Filled 2022-07-25: qty 1

## 2022-07-25 MED ORDER — CHLORHEXIDINE GLUCONATE 0.12 % MT SOLN
OROMUCOSAL | Status: AC
  Administered 2022-07-25: 15 mL via OROMUCOSAL
  Filled 2022-07-25: qty 15

## 2022-07-25 MED ORDER — OXYCODONE HCL 5 MG PO TABS
5.0000 mg | ORAL_TABLET | Freq: Once | ORAL | Status: AC | PRN
  Administered 2022-07-25: 5 mg via ORAL

## 2022-07-25 MED ORDER — PROPOFOL 10 MG/ML IV BOLUS
INTRAVENOUS | Status: AC
  Filled 2022-07-25: qty 20

## 2022-07-25 MED ORDER — ONDANSETRON HCL 4 MG/2ML IJ SOLN: 4.0000 mg | Freq: Once | INTRAMUSCULAR | Status: DC | PRN

## 2022-07-25 MED ORDER — FENTANYL CITRATE (PF) 100 MCG/2ML IJ SOLN: 25.0000 ug | INTRAMUSCULAR | Status: DC | PRN

## 2022-07-25 MED ORDER — ORAL CARE MOUTH RINSE: 15.0000 mL | Freq: Once | OROMUCOSAL | Status: AC

## 2022-07-25 SURGICAL SUPPLY — 40 items
ADH SKN CLS APL DERMABOND .7 (GAUZE/BANDAGES/DRESSINGS) ×1
APL PRP STRL LF DISP 70% ISPRP (MISCELLANEOUS) ×1
BNDG CMPR 75X21 PLY HI ABS (MISCELLANEOUS) ×1
CHLORAPREP W/TINT 26 (MISCELLANEOUS) ×1 IMPLANT
DERMABOND ADVANCED .7 DNX12 (GAUZE/BANDAGES/DRESSINGS) ×1 IMPLANT
DRAIN PENROSE 12X.25 LTX STRL (MISCELLANEOUS) ×1 IMPLANT
DRAPE LAPAROTOMY 77X122 PED (DRAPES) ×1 IMPLANT
DRAPE UNDER BUTTOCK W/FLU (DRAPES) IMPLANT
DRSG GAUZE FLUFF 36X18 (GAUZE/BANDAGES/DRESSINGS) ×1 IMPLANT
ELECT REM PT RETURN 9FT ADLT (ELECTROSURGICAL) ×1
ELECTRODE REM PT RTRN 9FT ADLT (ELECTROSURGICAL) ×1 IMPLANT
GAUZE 4X4 16PLY ~~LOC~~+RFID DBL (SPONGE) ×1 IMPLANT
GAUZE SPONGE 4X4 12PLY STRL (GAUZE/BANDAGES/DRESSINGS) ×1 IMPLANT
GAUZE STRETCH 2X75IN STRL (MISCELLANEOUS) ×1 IMPLANT
GLOVE SURG UNDER POLY LF SZ7.5 (GLOVE) ×1 IMPLANT
GOWN STRL REUS W/ TWL LRG LVL3 (GOWN DISPOSABLE) ×2 IMPLANT
GOWN STRL REUS W/ TWL XL LVL3 (GOWN DISPOSABLE) ×1 IMPLANT
GOWN STRL REUS W/TWL LRG LVL3 (GOWN DISPOSABLE) ×2
GOWN STRL REUS W/TWL XL LVL3 (GOWN DISPOSABLE) ×1
KIT TURNOVER KIT A (KITS) ×1 IMPLANT
LABEL OR SOLS (LABEL) ×1 IMPLANT
MANIFOLD NEPTUNE II (INSTRUMENTS) ×1 IMPLANT
NDL HYPO 25X1 1.5 SAFETY (NEEDLE) ×1 IMPLANT
NEEDLE HYPO 25X1 1.5 SAFETY (NEEDLE) ×1 IMPLANT
NS IRRIG 500ML POUR BTL (IV SOLUTION) ×1 IMPLANT
PACK BASIN MINOR ARMC (MISCELLANEOUS) ×1 IMPLANT
PACKING GAUZE IODOFORM 1INX5YD (GAUZE/BANDAGES/DRESSINGS) IMPLANT
SOL PREP PVP 2OZ (MISCELLANEOUS) ×1
SOLUTION PREP PVP 2OZ (MISCELLANEOUS) ×1 IMPLANT
SUPPORETR ATHLETIC LG (MISCELLANEOUS) ×1 IMPLANT
SUPPORTER ATHLETIC LG (MISCELLANEOUS) ×1
SUT ETHILON 3-0 FS-10 30 BLK (SUTURE) ×1
SUT VIC AB 3-0 SH 27 (SUTURE) ×2
SUT VIC AB 3-0 SH 27X BRD (SUTURE) ×2 IMPLANT
SUT VIC AB 4-0 SH 27 (SUTURE) ×1
SUT VIC AB 4-0 SH 27XANBCTRL (SUTURE) ×1 IMPLANT
SUTURE EHLN 3-0 FS-10 30 BLK (SUTURE) ×1 IMPLANT
SYR 10ML LL (SYRINGE) ×1 IMPLANT
TRAP FLUID SMOKE EVACUATOR (MISCELLANEOUS) ×1 IMPLANT
WATER STERILE IRR 500ML POUR (IV SOLUTION) ×1 IMPLANT

## 2022-07-25 NOTE — Interval H&P Note (Signed)
History and Physical Interval Note:  Since last office visit he has had drainage from the area which has decreased.  He desires to proceed with the procedure as planned.  All questions were answered  CV: RRR Lungs: Clear  07/25/2022 12:18 PM  Patrick Oconnor  has presented today for surgery, with the diagnosis of Scrotal Abscess.  The various methods of treatment have been discussed with the patient and family. After consideration of risks, benefits and other options for treatment, the patient has consented to  Procedure(s): INCISION AND DRAINAGE OF SCROTAL ABSCESS (N/A) as a surgical intervention.  The patient's history has been reviewed, patient examined, no change in status, stable for surgery.  I have reviewed the patient's chart and labs.  Questions were answered to the patient's satisfaction.     [place]

## 2022-07-25 NOTE — Anesthesia Preprocedure Evaluation (Addendum)
Anesthesia Evaluation  Patient identified by MRN, date of birth, ID band Patient awake    Reviewed: Allergy & Precautions, NPO status , Patient's Chart, lab work & pertinent test results  History of Anesthesia Complications (+) PONV and history of anesthetic complications  Airway Mallampati: IV   Neck ROM: Full    Dental no notable dental hx.    Pulmonary asthma , sleep apnea and Continuous Positive Airway Pressure Ventilation    Pulmonary exam normal breath sounds clear to auscultation       Cardiovascular hypertension, Normal cardiovascular exam Rhythm:Regular Rate:Normal  ECG 07/23/22:  Sinus bradycardia (HR 59) Otherwise normal ECG When compared with ECG of 18-Oct-2015 13:11, No significant change was found   Neuro/Psych  Headaches PSYCHIATRIC DISORDERS  Depression       GI/Hepatic ,GERD  ,,Ulcerative colitis   Endo/Other  Obesity   Renal/GU      Musculoskeletal  (+) Arthritis ,    Abdominal   Peds  Hematology  (+) Blood dyscrasia, anemia   Anesthesia Other Findings   Reproductive/Obstetrics                             Anesthesia Physical Anesthesia Plan  ASA: 2  Anesthesia Plan: General   Post-op Pain Management:    Induction: Intravenous  PONV Risk Score and Plan: 3 and Ondansetron, Dexamethasone and Treatment may vary due to age or medical condition  Airway Management Planned: LMA  Additional Equipment:   Intra-op Plan:   Post-operative Plan: Extubation in OR  Informed Consent: I have reviewed the patients History and Physical, chart, labs and discussed the procedure including the risks, benefits and alternatives for the proposed anesthesia with the patient or authorized representative who has indicated his/her understanding and acceptance.     Dental advisory given  Plan Discussed with: CRNA  Anesthesia Plan Comments: (Patient consented for risks of  anesthesia including but not limited to:  - adverse reactions to medications - damage to eyes, teeth, lips or other oral mucosa - nerve damage due to positioning  - sore throat or hoarseness - damage to heart, brain, nerves, lungs, other parts of body or loss of life  Informed patient about role of CRNA in peri- and intra-operative care.  Patient voiced understanding.)        Anesthesia Quick Evaluation

## 2022-07-25 NOTE — Discharge Instructions (Addendum)
AMBULATORY SURGERY  DISCHARGE INSTRUCTIONS   The drugs that you were given will stay in your system until tomorrow so for the next 24 hours you should not:  Drive an automobile Make any legal decisions Drink any alcoholic beverage   You may resume regular meals tomorrow.  Today it is better to start with liquids and gradually work up to solid foods.  You may eat anything you prefer, but it is better to start with liquids, then soup and crackers, and gradually work up to solid foods.   Please notify your doctor immediately if you have any unusual bleeding, trouble breathing, redness and pain at the surgery site, drainage, fever, or pain not relieved by medication.    Your post-operative visit with Dr.                                       is: Date:                        Time:    Please call to schedule your post-operative visit.  Additional Instructions:  Rx hydrocodone was sent to pharmacy.  Do not take Tylenol if you are taking the hydrocodone regularly Continue oral antibiotic Call for fever greater than 101 degrees You are scheduled for an appointment tomorrow morning at 945 for dressing change

## 2022-07-25 NOTE — Anesthesia Procedure Notes (Signed)
Procedure Name: LMA Insertion Date/Time: 07/25/2022 12:33 PM  Performed by: Biagio Borg, CRNAPre-anesthesia Checklist: Patient identified, Emergency Drugs available, Suction available and Patient being monitored Patient Re-evaluated:Patient Re-evaluated prior to induction Oxygen Delivery Method: Circle system utilized Preoxygenation: Pre-oxygenation with 100% oxygen Induction Type: IV induction Ventilation: Mask ventilation without difficulty LMA: LMA inserted LMA Size: 5.0 Tube type: Oral Number of attempts: 1 Placement Confirmation: positive ETCO2 and breath sounds checked- equal and bilateral Tube secured with: Tape Dental Injury: Teeth and Oropharynx as per pre-operative assessment

## 2022-07-25 NOTE — Anesthesia Postprocedure Evaluation (Signed)
Anesthesia Post Note  Patient: Patrick Oconnor  Procedure(s) Performed: INCISION AND DRAINAGE OF SCROTAL ABSCESS  Patient location during evaluation: PACU Anesthesia Type: General Level of consciousness: awake and alert, oriented and patient cooperative Pain management: pain level controlled Vital Signs Assessment: post-procedure vital signs reviewed and stable Respiratory status: spontaneous breathing, nonlabored ventilation and respiratory function stable Cardiovascular status: blood pressure returned to baseline and stable Postop Assessment: adequate PO intake Anesthetic complications: no   No notable events documented.   Last Vitals:  Vitals:   07/25/22 1345 07/25/22 1404  BP: (!) 154/100 (!) 179/99  Pulse: 76 80  Resp: (!) 23 18  Temp:  (!) 36.2 C  SpO2: 96% 98%    Last Pain:  Vitals:   07/25/22 1404  TempSrc: Temporal  PainSc: 3                  Darrin Nipper

## 2022-07-25 NOTE — Transfer of Care (Signed)
Immediate Anesthesia Transfer of Care Note  Patient: Patrick Oconnor  Procedure(s) Performed: INCISION AND DRAINAGE OF SCROTAL ABSCESS  Patient Location: PACU  Anesthesia Type:General  Level of Consciousness: sedated  Airway & Oxygen Therapy: Patient Spontanous Breathing and Patient connected to face mask oxygen  Post-op Assessment: Report given to RN and Post -op Vital signs reviewed and stable  Post vital signs: Reviewed and stable  Last Vitals:  Vitals Value Taken Time  BP 94/59 07/25/22 1310  Temp    Pulse 57 07/25/22 1312  Resp    SpO2 99 % 07/25/22 1312  Vitals shown include unvalidated device data.  Last Pain:  Vitals:   07/25/22 1058  TempSrc: Temporal  PainSc: 6          Complications: No notable events documented.

## 2022-07-26 ENCOUNTER — Ambulatory Visit (INDEPENDENT_AMBULATORY_CARE_PROVIDER_SITE_OTHER): Payer: 59 | Admitting: Physician Assistant

## 2022-07-26 ENCOUNTER — Ambulatory Visit: Payer: 59 | Admitting: Physician Assistant

## 2022-07-26 ENCOUNTER — Encounter: Payer: Self-pay | Admitting: Urology

## 2022-07-26 VITALS — BP 117/73 | HR 79 | Ht 74.0 in | Wt 240.0 lb

## 2022-07-26 DIAGNOSIS — M79641 Pain in right hand: Secondary | ICD-10-CM | POA: Diagnosis not present

## 2022-07-26 DIAGNOSIS — M79642 Pain in left hand: Secondary | ICD-10-CM | POA: Diagnosis not present

## 2022-07-26 DIAGNOSIS — N492 Inflammatory disorders of scrotum: Secondary | ICD-10-CM

## 2022-07-26 NOTE — Progress Notes (Signed)
Patient presented to clinic today for wound check and dressing change.  He is POD 1 from I&D of a left scrotal wall abscess with Dr. Bernardo Heater.  I removed the patient's ABD pad and packing in their entirety.  Patient tolerated well.  Abscess cavity is rather shallow and appears to have healthy granulation tissue without.  There is no evidence of purulence or cellulitis.  I repacked the wound with the small volume of moistened gauze and recovered it with an ABD pad.  I gave the patient supplies to perform daily dressing changes at home.  He expressed understanding.  Return for 4 week Wound check.

## 2022-07-28 NOTE — Op Note (Signed)
Preoperative diagnosis:  Left scrotal wall abscess  Postoperative diagnosis:  Same  Procedure: Incision and drainage of left scrotal wall abscess  Surgeon: Abbie Sons, MD  Anesthesia: General  Complications: None  Intraoperative findings:  Elongated, shallow abscess cavity measuring 2 x 5 cm  EBL: Minimal  Specimens: Aerobic/anaerobic wound cultures  Indication: Patrick Oconnor is a 56 y.o. patient with a history of recurrent scrotal abscess.  Seen in the office last week with a recurrent abscess lateral aspect of the left hemiscrotum.  Due to his work schedule he requested to delay I&D until this week.  He did have spontaneous drainage of the abscess but no recent drainage.  After reviewing the management options for treatment, he elected to proceed with the above surgical procedure(s). We have discussed the potential benefits and risks of the procedure, side effects of the proposed treatment, the likelihood of the patient achieving the goals of the procedure, and any potential problems that might occur during the procedure or recuperation. Informed consent has been obtained.  Description of procedure:  The patient was taken to the operating room and general anesthesia was induced.  The patient was placed in the dorsal lithotomy position, prepped and draped in the usual sterile fashion, and preoperative antibiotics were administered. A preoperative time-out was performed.   Examination was performed and fluctuance was noted in the inferior portion of the lateral left hemiscrotal wall which was significantly decreased from his exam last week.  A 1 cm incision was made over the fluctuant area a small amount of serous bloody drainage was obtained.  A hemostat was placed in the incision and tracked upwards for an additional 4 cm.  The incision was extended to encompass the entire length of the abscess cavity.  The cavity depth was shallow.  Site was copiously irrigated with  saline.  Hemostasis was obtained with electrocautery.  A 1 inch iodoform packing was placed followed by 4 x 4's, ABD and mesh underwear.  After anesthetic reversal he was transported to the PACU in stable condition.  Plan: Scheduled for office follow-up with dressing change 07/26/2022   Abbie Sons, M.D.

## 2022-08-01 DIAGNOSIS — L738 Other specified follicular disorders: Secondary | ICD-10-CM | POA: Diagnosis not present

## 2022-08-01 DIAGNOSIS — L821 Other seborrheic keratosis: Secondary | ICD-10-CM | POA: Diagnosis not present

## 2022-08-01 DIAGNOSIS — L814 Other melanin hyperpigmentation: Secondary | ICD-10-CM | POA: Diagnosis not present

## 2022-08-01 DIAGNOSIS — D485 Neoplasm of uncertain behavior of skin: Secondary | ICD-10-CM | POA: Diagnosis not present

## 2022-08-01 DIAGNOSIS — D2271 Melanocytic nevi of right lower limb, including hip: Secondary | ICD-10-CM | POA: Diagnosis not present

## 2022-08-01 DIAGNOSIS — D2262 Melanocytic nevi of left upper limb, including shoulder: Secondary | ICD-10-CM | POA: Diagnosis not present

## 2022-08-01 DIAGNOSIS — D2272 Melanocytic nevi of left lower limb, including hip: Secondary | ICD-10-CM | POA: Diagnosis not present

## 2022-08-01 DIAGNOSIS — D225 Melanocytic nevi of trunk: Secondary | ICD-10-CM | POA: Diagnosis not present

## 2022-08-01 DIAGNOSIS — D2261 Melanocytic nevi of right upper limb, including shoulder: Secondary | ICD-10-CM | POA: Diagnosis not present

## 2022-08-01 DIAGNOSIS — D224 Melanocytic nevi of scalp and neck: Secondary | ICD-10-CM | POA: Diagnosis not present

## 2022-08-02 LAB — AEROBIC/ANAEROBIC CULTURE W GRAM STAIN (SURGICAL/DEEP WOUND)

## 2022-08-03 HISTORY — PX: CYST EXCISION: SHX5701

## 2022-08-20 ENCOUNTER — Encounter: Payer: Self-pay | Admitting: Urology

## 2022-08-23 ENCOUNTER — Encounter: Payer: Self-pay | Admitting: Urology

## 2022-08-23 ENCOUNTER — Ambulatory Visit (INDEPENDENT_AMBULATORY_CARE_PROVIDER_SITE_OTHER): Payer: 59 | Admitting: Urology

## 2022-08-23 VITALS — BP 170/71 | HR 61 | Ht 74.0 in | Wt 240.0 lb

## 2022-08-23 DIAGNOSIS — Z09 Encounter for follow-up examination after completed treatment for conditions other than malignant neoplasm: Secondary | ICD-10-CM | POA: Diagnosis not present

## 2022-08-23 NOTE — Progress Notes (Signed)
08/23/2022 9:09 AM   Patrick Oconnor 03/18/1966 [id number]  Referring provider: Tonia Ghent, MD [place],   [id number]  Chief Complaint  Patient presents with   Other    HPI: 57 y.o. male presents for postop follow-up.  I&D scrotal abscess 07/25/2022 Cultures positive Staphylococcus Lugdunensis sensitive to Septra No postop problems   PMH: Past Medical History:  Diagnosis Date   Anemia    Asthma, exercise induced    Degenerative disc disease, lumbar    Depression    Environmental allergies    GERD (gastroesophageal reflux disease)    History of bronchitis    Hypertension    mild, no medications   IBS (irritable bowel syndrome)    Pneumonia    hx of   PONV (postoperative nausea and vomiting)    Sleep apnea 04/2007   Started CPAP; wears CPAP nightly   Tinnitus aurium, left    Ulcerative colitis [facility])     Surgical History: Past Surgical History:  Procedure Laterality Date   ACROMIO-CLAVICULAR JOINT REPAIR Right 04/15/2013   Procedure: RIGHT SHOULDER ACROMIO-CLAVICULAR RECONSTRUCTION WITH ALLOGRAFT   ;  Surgeon: Marin Shutter, MD;  Location: [place];  Service: Orthopedics;  Laterality: Right;   ACROMIO-CLAVICULAR JOINT REPAIR Right 06/03/2013   Procedure: ACROMIO-CLAVICULAR JOINT REPAIR;  Surgeon: Marin Shutter, MD;  Location: [place];  Service: Orthopedics;  Laterality: Right;   ACROMIO-CLAVICULAR JOINT REPAIR Right 10/19/2015   Procedure: OPEN RIGHT SHOULDER ACROMIO-CLAVICULAR JOINT RECONSTRUCTION ;  Surgeon: Justice Britain, MD;  Location: [place];  Service: Orthopedics;  Laterality: Right;   COLONOSCOPY  08/12/2005   "multiple"   ESOPHAGOGASTRODUODENOSCOPY  3/09   normal; multiple   ESOPHAGOGASTRODUODENOSCOPY (EGD) WITH PROPOFOL N/A 01/22/2016   Procedure: ESOPHAGOGASTRODUODENOSCOPY (EGD) WITH PROPOFOL;  Surgeon: Laurence Spates, MD;  Location: [facility];  Service: Endoscopy;  Laterality: N/A;   INCISION AND DRAINAGE Right 06/03/2013    Procedure: INCISION AND DRAINAGE RIGHT SHOULDER;  Surgeon: Marin Shutter, MD;  Location: [place];  Service: Orthopedics;  Laterality: Right;   INCISION AND DRAINAGE ABSCESS N/A 07/25/2022   Procedure: INCISION AND DRAINAGE OF SCROTAL ABSCESS;  Surgeon: Abbie Sons, MD;  Location: [facility];  Service: Urology;  Laterality: N/A;   pilonidal cystectomy  42003   x 2   PYLOROPLASTY  infant   pyloric stenosis   SEPTOPLASTY     SHOULDER OPEN ROTATOR CUFF REPAIR Right 10/19/2015   Procedure: OPEN RIGHT SHOULDER ROTATOR CUFF REPAIR ;  Surgeon: Justice Britain, MD;  Location: [place];  Service: Orthopedics;  Laterality: Right;   thrombosed external hemorrhoid  9/08   incised   TONSILLECTOMY AND ADENOIDECTOMY      Home Medications:  Allergies as of 08/23/2022       Reactions   Septra [sulfamethoxazole-trimethoprim] Other (See Comments)   Heads and joint pain        Medication List        Accurate as of August 23, 2022  9:09 AM. If you have any questions, ask your nurse or doctor.          acetaminophen 500 MG tablet Commonly known as: TYLENOL Take 1,000 mg by mouth every 6 (six) hours as needed for moderate pain.   albuterol 108 (90 Base) MCG/ACT inhaler Commonly known as: VENTOLIN HFA Inhale 1-2 puffs into the lungs every 6 (six) hours as needed for wheezing (prior to exercise.).   citalopram 20 MG tablet Commonly known as: CELEXA Take 1  tablet (20 mg total) by mouth daily.   colchicine 0.6 MG tablet Take 1 tablet (0.6 mg total) by mouth daily as needed. Okay to fill with mitigare, colchicine, colcrys.  Take with food.   Dulcolax 5 MG EC tablet Generic drug: bisacodyl daily as needed.   ferrous sulfate 325 (65 FE) MG tablet Take 1 tablet (325 mg total) by mouth daily.   fluticasone 50 MCG/ACT nasal spray Commonly known as: FLONASE Place 1 spray into both nostrils daily. What changed:  when to take this reasons to take this   HYDROcodone-acetaminophen 5-325 MG  tablet Commonly known as: NORCO/VICODIN Take 1-2 tablets by mouth every 6 (six) hours as needed for moderate pain.   ibuprofen 200 MG tablet Commonly known as: ADVIL Take 200 mg by mouth every 6 (six) hours as needed. 2 to 3 tabs   loratadine 10 MG tablet Commonly known as: CLARITIN Take 10 mg by mouth daily.   mesalamine 0.375 g 24 hr capsule Commonly known as: APRISO Take 4 capsules (1.5 g total) by mouth every morning.   pantoprazole 20 MG tablet Commonly known as: PROTONIX Take 1 tablet (20 mg total) by mouth daily.   polyethylene glycol powder 17 GM/SCOOP powder Commonly known as: GLYCOLAX/MIRALAX Take 17 g by mouth 2 (two) times daily as needed (For constipation.).   PROBIOTIC DAILY PO Take 1 capsule by mouth daily as needed (For digestive health.).   rOPINIRole 1 MG tablet Commonly known as: REQUIP Take 1-2 tablets (1-2 mg total) by mouth daily as needed (for RLS).   sennosides-docusate sodium 8.6-50 MG tablet Commonly known as: SENOKOT-S Take 1-2 tablets by mouth daily as needed for constipation.   VISINE ADVANCED RELIEF OP Apply 1-2 drops to eye as needed (For eye irritation.).   Vitamin D 50 MCG (2000 UT) Caps Take 1 capsule by mouth daily. 5000 ut        Allergies:  Allergies  Allergen Reactions   Septra [Sulfamethoxazole-Trimethoprim] Other (See Comments)    Heads and joint pain    Family History: Family History  Problem Relation Age of Onset   Cancer Father 35       prostate Stage 4   Prostate cancer Father    Cancer Maternal Grandmother        breast   Cancer Mother    Cancer Paternal Grandmother        Colon   Colon cancer Paternal Grandmother     Social History:  reports that he has never smoked. He has never been exposed to tobacco smoke. He has never used smokeless tobacco. He reports current alcohol use of about 0.3 standard drinks of alcohol per week. He reports that he does not use drugs.   Physical Exam: BP (!) 170/71    Pulse 61   Ht '6\' 2"'$  (1.88 m)   Wt 240 lb (108.9 kg)   BMI 30.81 kg/m   Constitutional:  Alert and oriented, No acute distress. GU: I&D site closing and granulating nicely.  No induration, erythema   Assessment & Plan:    Doing well status post I&D recurrent scrotal abscess.  I&D site healing nicely Follow-up as needed   Abbie Sons, MD  [facility] [address] [place], [place] [address] [phone]

## 2022-08-31 ENCOUNTER — Other Ambulatory Visit: Payer: Self-pay | Admitting: Family Medicine

## 2022-08-31 DIAGNOSIS — M109 Gout, unspecified: Secondary | ICD-10-CM

## 2022-08-31 DIAGNOSIS — G2581 Restless legs syndrome: Secondary | ICD-10-CM

## 2022-08-31 DIAGNOSIS — E559 Vitamin D deficiency, unspecified: Secondary | ICD-10-CM

## 2022-08-31 DIAGNOSIS — E781 Pure hyperglyceridemia: Secondary | ICD-10-CM

## 2022-08-31 DIAGNOSIS — Z125 Encounter for screening for malignant neoplasm of prostate: Secondary | ICD-10-CM

## 2022-09-10 DIAGNOSIS — D485 Neoplasm of uncertain behavior of skin: Secondary | ICD-10-CM | POA: Diagnosis not present

## 2022-09-10 DIAGNOSIS — L988 Other specified disorders of the skin and subcutaneous tissue: Secondary | ICD-10-CM | POA: Diagnosis not present

## 2022-09-12 ENCOUNTER — Other Ambulatory Visit (INDEPENDENT_AMBULATORY_CARE_PROVIDER_SITE_OTHER): Payer: 59

## 2022-09-12 DIAGNOSIS — Z125 Encounter for screening for malignant neoplasm of prostate: Secondary | ICD-10-CM

## 2022-09-12 DIAGNOSIS — E781 Pure hyperglyceridemia: Secondary | ICD-10-CM | POA: Diagnosis not present

## 2022-09-12 DIAGNOSIS — G2581 Restless legs syndrome: Secondary | ICD-10-CM

## 2022-09-12 DIAGNOSIS — E559 Vitamin D deficiency, unspecified: Secondary | ICD-10-CM

## 2022-09-12 DIAGNOSIS — M109 Gout, unspecified: Secondary | ICD-10-CM

## 2022-09-12 LAB — IRON: Iron: 70 ug/dL (ref 42–165)

## 2022-09-12 LAB — COMPREHENSIVE METABOLIC PANEL
ALT: 20 U/L (ref 0–53)
AST: 23 U/L (ref 0–37)
Albumin: 4.5 g/dL (ref 3.5–5.2)
Alkaline Phosphatase: 76 U/L (ref 39–117)
BUN: 18 mg/dL (ref 6–23)
CO2: 29 mEq/L (ref 19–32)
Calcium: 8.7 mg/dL (ref 8.4–10.5)
Chloride: 104 mEq/L (ref 96–112)
Creatinine, Ser: 0.84 mg/dL (ref 0.40–1.50)
GFR: 95.91 mL/min (ref >60.00)
Glucose, Bld: 95 mg/dL (ref 70–99)
Potassium: 4 mEq/L (ref 3.5–5.1)
Sodium: 141 mEq/L (ref 135–145)
Total Bilirubin: 0.5 mg/dL (ref 0.2–1.2)
Total Protein: 6.8 g/dL (ref 6.0–8.3)

## 2022-09-12 LAB — URIC ACID: Uric Acid, Serum: 7.3 mg/dL (ref 4.0–7.8)

## 2022-09-12 LAB — LIPID PANEL
Cholesterol: 148 mg/dL (ref 0–200)
HDL: 30.9 mg/dL — ABNORMAL LOW (ref >39.00)
LDL Cholesterol: 82 mg/dL (ref 0–99)
NonHDL: 116.71
Total CHOL/HDL Ratio: 5
Triglycerides: 176 mg/dL — ABNORMAL HIGH (ref 0.0–149.0)
VLDL: 35.2 mg/dL (ref 0.0–40.0)

## 2022-09-12 LAB — VITAMIN D 25 HYDROXY (VIT D DEFICIENCY, FRACTURES): VITD: 47.49 ng/mL (ref 30.00–100.00)

## 2022-09-12 LAB — FERRITIN: Ferritin: 30.9 ng/mL (ref 22.0–322.0)

## 2022-09-12 LAB — PSA: PSA: 0.35 ng/mL (ref 0.10–4.00)

## 2022-09-12 LAB — TSH: TSH: 2.02 u[IU]/mL (ref 0.35–5.50)

## 2022-09-19 ENCOUNTER — Ambulatory Visit (INDEPENDENT_AMBULATORY_CARE_PROVIDER_SITE_OTHER): Payer: 59 | Admitting: Family Medicine

## 2022-09-19 ENCOUNTER — Encounter: Payer: Self-pay | Admitting: Family Medicine

## 2022-09-19 ENCOUNTER — Other Ambulatory Visit (HOSPITAL_COMMUNITY): Payer: Self-pay

## 2022-09-19 ENCOUNTER — Encounter: Payer: Self-pay | Admitting: *Deleted

## 2022-09-19 VITALS — BP 130/80 | HR 74 | Temp 97.9°F | Ht 74.0 in | Wt 249.0 lb

## 2022-09-19 DIAGNOSIS — K51919 Ulcerative colitis, unspecified with unspecified complications: Secondary | ICD-10-CM

## 2022-09-19 DIAGNOSIS — Z0000 Encounter for general adult medical examination without abnormal findings: Secondary | ICD-10-CM

## 2022-09-19 DIAGNOSIS — Z8249 Family history of ischemic heart disease and other diseases of the circulatory system: Secondary | ICD-10-CM

## 2022-09-19 DIAGNOSIS — F32A Depression, unspecified: Secondary | ICD-10-CM

## 2022-09-19 DIAGNOSIS — Z7189 Other specified counseling: Secondary | ICD-10-CM

## 2022-09-19 DIAGNOSIS — G2581 Restless legs syndrome: Secondary | ICD-10-CM

## 2022-09-19 DIAGNOSIS — M109 Gout, unspecified: Secondary | ICD-10-CM

## 2022-09-19 MED ORDER — PANTOPRAZOLE SODIUM 20 MG PO TBEC
20.0000 mg | DELAYED_RELEASE_TABLET | Freq: Every day | ORAL | 3 refills | Status: DC
  Filled 2022-09-19 – 2022-10-03 (×2): qty 90, 90d supply, fill #0
  Filled 2023-01-08 (×2): qty 90, 90d supply, fill #1
  Filled 2023-04-15: qty 90, 90d supply, fill #0
  Filled 2023-04-15: qty 90, 90d supply, fill #2
  Filled 2023-07-30: qty 90, 90d supply, fill #1

## 2022-09-19 MED ORDER — CITALOPRAM HYDROBROMIDE 20 MG PO TABS
20.0000 mg | ORAL_TABLET | Freq: Every day | ORAL | 3 refills | Status: DC
  Filled 2022-09-19 – 2022-10-03 (×3): qty 90, 90d supply, fill #0
  Filled 2023-01-08 (×2): qty 90, 90d supply, fill #1
  Filled 2023-04-15 (×2): qty 90, 90d supply, fill #2
  Filled 2023-07-30 (×2): qty 90, 90d supply, fill #3

## 2022-09-19 NOTE — Progress Notes (Signed)
CPE- See plan.  Routine anticipatory guidance given to patient.  See health maintenance.  The possibility exists that previously documented standard health maintenance information may have been brought forward from a previous encounter into this note.  If needed, that same information has been updated to reflect the current situation based on today's encounter.    covid vaccine 2021 Tetanus 2013, can be done in the future.   Flu done at work PNA not due Shingles 2022 Colonoscopy 2021 PSA 2024 Diet and exercise d/w pt.   Advance directive d/w pt.  Bother and sister in law designated if patient were incapacitated.    Gout. Not on HCTZ.  Using colchicine prn- but only rare use.  Uric acid reasonable, 7.3.    Mood d/w pt.  Still on citalopram at baseline.  Mood is good.  No SI/HI.  He wanted to continue as is with SSRI, d/w pt.    RLS.  Requip helps.  No ADE on med.  Compliant.  He is still bothered by symptoms if he doesn't take it prior 8 PM.  Taking 2 mg, lower dose didn't help.  Offered neuro eval, he wanted to defer for now.    UC. Doing well.  No abd pain.  Still on apriso at baseline. I asked him to check on follow up with GI.    Calcium scoring d/w pt.  Mult family members with abnormal scoring, ie FH CAD.    Scrotal abscess prev addressed, he improved in the meantime.   PMH and SH reviewed  Meds, vitals, and allergies reviewed.   ROS: Per HPI.  Unless specifically indicated otherwise in HPI, the patient denies:  General: fever. Eyes: acute vision changes ENT: sore throat Cardiovascular: chest pain Respiratory: SOB GI: vomiting GU: dysuria Musculoskeletal: acute back pain Derm: acute rash Neuro: acute motor dysfunction Psych: worsening mood Endocrine: polydipsia Heme: bleeding Allergy: hayfever  GEN: nad, alert and oriented HEENT: ncat NECK: supple w/o LA CV: rrr. PULM: ctab, no inc wob ABD: soft, +bs EXT: no edema SKIN: no acute rash

## 2022-09-19 NOTE — Patient Instructions (Addendum)
Please call [facility] this spring about follow up.  Take care.  Glad to see you. Tetanus shot when possible.   Let me know if you don't get a call about setting up the calcium scoring test.   Let me now if you want to see neurology.

## 2022-09-22 DIAGNOSIS — M25512 Pain in left shoulder: Secondary | ICD-10-CM | POA: Diagnosis not present

## 2022-09-22 DIAGNOSIS — Z8249 Family history of ischemic heart disease and other diseases of the circulatory system: Secondary | ICD-10-CM | POA: Insufficient documentation

## 2022-09-22 DIAGNOSIS — R030 Elevated blood-pressure reading, without diagnosis of hypertension: Secondary | ICD-10-CM | POA: Diagnosis not present

## 2022-09-22 NOTE — Assessment & Plan Note (Signed)
Doing well.  No abd pain.  Still on apriso at baseline. I asked him to check on follow up with GI.

## 2022-09-22 NOTE — Assessment & Plan Note (Signed)
covid vaccine 2021 Tetanus 2013, can be done in the future.   Flu done at work PNA not due Shingles 2022 Colonoscopy 2021 PSA 2024 Diet and exercise d/w pt.   Advance directive d/w pt.  Bother and sister in law designated if patient were incapacitated.

## 2022-09-22 NOTE — Assessment & Plan Note (Signed)
Not on HCTZ.  Using colchicine prn- but only rare use.  Uric acid reasonable, 7.3.  continue prn colchicine.

## 2022-09-22 NOTE — Assessment & Plan Note (Signed)
Requip helps.  No ADE on med.  Compliant.  He is still bothered by symptoms if he doesn't take it prior 8 PM.  Taking 2 mg, lower dose didn't help.  Offered neuro eval, he wanted to defer for now.

## 2022-09-22 NOTE — Assessment & Plan Note (Signed)
Still on citalopram at baseline.  Mood is good.  No SI/HI.  He wanted to continue as is with SSRI, d/w pt.

## 2022-09-22 NOTE — Assessment & Plan Note (Signed)
Advance directive d/w pt.  Bother and sister in law designated if patient were incapacitated.

## 2022-09-22 NOTE — Assessment & Plan Note (Signed)
Calcium scoring d/w pt.  Mult family members with abnormal scoring, ie FH CAD.  Calcium scoring ordered.

## 2022-10-02 DIAGNOSIS — M25512 Pain in left shoulder: Secondary | ICD-10-CM | POA: Diagnosis not present

## 2022-10-03 ENCOUNTER — Other Ambulatory Visit: Payer: Self-pay

## 2022-10-03 ENCOUNTER — Ambulatory Visit
Admission: RE | Admit: 2022-10-03 | Discharge: 2022-10-03 | Disposition: A | Discharge status: Home / Self Care | Payer: 59 | Source: Ambulatory Visit | Attending: Family Medicine | Admitting: Family Medicine

## 2022-10-03 ENCOUNTER — Other Ambulatory Visit: Payer: Self-pay | Admitting: Orthopedic Surgery

## 2022-10-03 DIAGNOSIS — Z8249 Family history of ischemic heart disease and other diseases of the circulatory system: Secondary | ICD-10-CM | POA: Insufficient documentation

## 2022-10-03 DIAGNOSIS — M25512 Pain in left shoulder: Secondary | ICD-10-CM

## 2022-10-04 ENCOUNTER — Ambulatory Visit
Admission: RE | Admit: 2022-10-04 | Discharge: 2022-10-04 | Disposition: A | Discharge status: Home / Self Care | Payer: 59 | Source: Ambulatory Visit | Attending: Orthopedic Surgery | Admitting: Orthopedic Surgery

## 2022-10-04 DIAGNOSIS — M25512 Pain in left shoulder: Secondary | ICD-10-CM | POA: Insufficient documentation

## 2022-10-04 DIAGNOSIS — S46012A Strain of muscle(s) and tendon(s) of the rotator cuff of left shoulder, initial encounter: Secondary | ICD-10-CM | POA: Diagnosis not present

## 2022-10-14 DIAGNOSIS — M25512 Pain in left shoulder: Secondary | ICD-10-CM | POA: Diagnosis not present

## 2022-10-23 NOTE — Progress Notes (Signed)
Pt. Needs orders for surgery. 

## 2022-10-23 NOTE — Patient Instructions (Signed)
DUE TO COVID-19 ONLY TWO VISITORS  (aged 59 and older)  ARE ALLOWED TO COME WITH YOU AND STAY IN THE WAITING ROOM ONLY DURING PRE OP AND PROCEDURE.   **NO VISITORS ARE ALLOWED IN THE SHORT STAY AREA OR RECOVERY ROOM!!**  IF YOU WILL BE ADMITTED INTO THE HOSPITAL YOU ARE ALLOWED ONLY FOUR SUPPORT PEOPLE DURING VISITATION HOURS ONLY (7 AM -8PM)   The support person(s) must pass our screening, gel in and out, and wear a mask at all times, including in the patient's room. Patients must also wear a mask when staff or their support person are in the room. Visitors GUEST BADGE MUST BE WORN VISIBLY  One adult visitor may remain with you overnight and MUST be in the room by 8 P.M.     Your procedure is scheduled on: 10/31/22   Report to [facility] Main Entrance    Report to admitting at : 1:15 PM   Call this number if you have problems the morning of surgery [phone]   Do not eat food :After Midnight.   After Midnight you may have the following liquids until: 12:30 PM DAY OF SURGERY  Water Black Coffee (sugar ok, NO MILK/CREAM OR CREAMERS)  Tea (sugar ok, NO MILK/CREAM OR CREAMERS) regular and decaf                             Plain Jell-O (NO RED)                                           Fruit ices (not with fruit pulp, NO RED)                                     Popsicles (NO RED)                                                                  Juice: apple, WHITE grape, WHITE cranberry Sports drinks like Gatorade (NO RED)   The day of surgery:  Drink ONE (1) Pre-Surgery Clear Ensure or G2 at :12:30 PM the morning of surgery. Drink in one sitting. Do not sip.  This drink was given to you during your hospital  pre-op appointment visit. Nothing else to drink after completing the  Pre-Surgery Clear Ensure or G2.          If you have questions, please contact your surgeon's office.  Oral Hygiene is also important to reduce your risk of infection.                                     Remember - BRUSH YOUR TEETH THE MORNING OF SURGERY WITH YOUR REGULAR TOOTHPASTE  DENTURES WILL BE REMOVED PRIOR TO SURGERY PLEASE DO NOT APPLY "Poly grip" OR ADHESIVES!!!   Do NOT smoke after Midnight   Take these medicines the morning of surgery with A SIP OF WATER: Celexa,loratadine,pantoprazole.Use inhalers as usual.Tylenol,ropinirole,colchicine as needed.  Bring CPAP  mask and tubing day of surgery.                              You may not have any metal on your body including hair pins, jewelry, and body piercing             Do not wear lotions, powders, perfumes/cologne, or deodorant              Men may shave face and neck.   Do not bring valuables to the hospital. [place].   Contacts, glasses, or bridgework may not be worn into surgery.   Bring small overnight bag day of surgery.   DO NOT [place]. PHARMACY WILL DISPENSE MEDICATIONS LISTED ON YOUR MEDICATION LIST TO YOU DURING YOUR ADMISSION [place]!    Patients discharged on the day of surgery will not be allowed to drive home.  Someone NEEDS to stay with you for the first 24 hours after anesthesia.   Special Instructions: Bring a copy of your healthcare power of attorney and living will documents         the day of surgery if you haven't scanned them before.              Please read over the following fact sheets you were given: IF YOU HAVE QUESTIONS ABOUT YOUR PRE-OP INSTRUCTIONS PLEASE CALL [phone]    [facility] - Preparing for Surgery Before surgery, you can play an important role.  Because skin is not sterile, your skin needs to be as free of germs as possible.  You can reduce the number of germs on your skin by washing with CHG (chlorahexidine gluconate) soap before surgery.  CHG is an antiseptic cleaner which kills germs and bonds with the skin to continue killing germs even after washing. Please DO NOT use if  you have an allergy to CHG or antibacterial soaps.  If your skin becomes reddened/irritated stop using the CHG and inform your nurse when you arrive at Short Stay. Do not shave (including legs and underarms) for at least 48 hours prior to the first CHG shower.  You may shave your face/neck. Please follow these instructions carefully:  1.  Shower with CHG Soap the night before surgery and the  morning of Surgery.  2.  If you choose to wash your hair, wash your hair first as usual with your  normal  shampoo.  3.  After you shampoo, rinse your hair and body thoroughly to remove the  shampoo.                           4.  Use CHG as you would any other liquid soap.  You can apply chg directly  to the skin and wash                       Gently with a scrungie or clean washcloth.  5.  Apply the CHG Soap to your body ONLY FROM THE NECK DOWN.   Do not use on face/ open                           Wound or open sores. Avoid contact with eyes, ears  mouth and genitals (private parts).                       Wash face,  Genitals (private parts) with your normal soap.             6.  Wash thoroughly, paying special attention to the area where your surgery  will be performed.  7.  Thoroughly rinse your body with warm water from the neck down.  8.  DO NOT shower/wash with your normal soap after using and rinsing off  the CHG Soap.                9.  Pat yourself dry with a clean towel.            10.  Wear clean pajamas.            11.  Place clean sheets on your bed the night of your first shower and do not  sleep with pets. Day of Surgery : Do not apply any lotions/deodorants the morning of surgery.  Please wear clean clothes to the hospital/surgery center.  FAILURE TO FOLLOW THESE INSTRUCTIONS MAY RESULT IN THE CANCELLATION OF YOUR SURGERY PATIENT SIGNATURE_________________________________  NURSE  SIGNATURE__________________________________  ________________________________________________________________________

## 2022-10-24 ENCOUNTER — Other Ambulatory Visit: Payer: Self-pay

## 2022-10-24 ENCOUNTER — Encounter (HOSPITAL_COMMUNITY)
Admission: RE | Admit: 2022-10-24 | Discharge: 2022-10-24 | Disposition: A | Discharge status: Home / Self Care | Payer: 59 | Source: Ambulatory Visit | Attending: Orthopedic Surgery | Admitting: Orthopedic Surgery

## 2022-10-24 ENCOUNTER — Encounter (HOSPITAL_COMMUNITY): Payer: Self-pay

## 2022-10-24 VITALS — BP 151/91 | HR 63 | Temp 98.0°F | Ht 73.0 in | Wt 249.0 lb

## 2022-10-24 DIAGNOSIS — I10 Essential (primary) hypertension: Secondary | ICD-10-CM | POA: Insufficient documentation

## 2022-10-24 DIAGNOSIS — Z01812 Encounter for preprocedural laboratory examination: Secondary | ICD-10-CM | POA: Diagnosis not present

## 2022-10-24 LAB — BASIC METABOLIC PANEL
Anion gap: 5 (ref 5–15)
BUN: 23 mg/dL — ABNORMAL HIGH (ref 6–20)
CO2: 25 mmol/L (ref 22–32)
Calcium: 8.7 mg/dL — ABNORMAL LOW (ref 8.9–10.3)
Chloride: 111 mmol/L (ref 98–111)
Creatinine, Ser: 0.89 mg/dL (ref 0.61–1.24)
GFR, Estimated: >60 mL/min (ref >60)
Glucose, Bld: 112 mg/dL — ABNORMAL HIGH (ref 70–99)
Potassium: 4 mmol/L (ref 3.5–5.1)
Sodium: 141 mmol/L (ref 135–145)

## 2022-10-24 LAB — CBC
HCT: 45.4 % (ref 39.0–52.0)
Hemoglobin: 14.8 g/dL (ref 13.0–17.0)
MCH: 28.4 pg (ref 26.0–34.0)
MCHC: 32.6 g/dL (ref 30.0–36.0)
MCV: 87 fL (ref 80.0–100.0)
Platelets: 208 10*3/uL (ref 150–400)
RBC: 5.22 MIL/uL (ref 4.22–5.81)
RDW: 12.9 % (ref 11.5–15.5)
WBC: 7.5 10*3/uL (ref 4.0–10.5)
nRBC: 0 % (ref 0.0–0.2)

## 2022-10-24 NOTE — Progress Notes (Signed)
  Bowel prep reminder:  PCP - Dr. Elsie Stain. LOV: 09/19/22 Cardiologist - N/A  Chest x-ray -  EKG - 07/23/22 Stress Test -  ECHO -  Cardiac Cath -  Pacemaker/ICD device last checked:  Sleep Study - Yes CPAP - Yes  Fasting Blood Sugar - N/A Checks Blood Sugar _____ times a day  Blood Thinner Instructions: Aspirin Instructions: Last Dose:  Anesthesia review: Hx: HTN,OSA.  Patient denies shortness of breath, fever, cough and chest pain at PAT appointment   Patient verbalized understanding of instructions that were given to them at the PAT appointment. Patient was also instructed that they will need to review over the PAT instructions again at home before surgery.

## 2022-10-31 ENCOUNTER — Ambulatory Visit (HOSPITAL_COMMUNITY): Payer: 59 | Admitting: Certified Registered Nurse Anesthetist

## 2022-10-31 ENCOUNTER — Other Ambulatory Visit: Payer: Self-pay

## 2022-10-31 ENCOUNTER — Ambulatory Visit (HOSPITAL_BASED_OUTPATIENT_CLINIC_OR_DEPARTMENT_OTHER): Payer: 59 | Admitting: Certified Registered Nurse Anesthetist

## 2022-10-31 ENCOUNTER — Encounter (HOSPITAL_COMMUNITY)
Admission: RE | Disposition: A | Discharge status: Home / Self Care | Payer: Self-pay | Source: Ambulatory Visit | Attending: Orthopedic Surgery

## 2022-10-31 ENCOUNTER — Ambulatory Visit (HOSPITAL_COMMUNITY)
Admission: RE | Admit: 2022-10-31 | Discharge: 2022-10-31 | Disposition: A | Discharge status: Home / Self Care | Payer: 59 | Source: Ambulatory Visit | Attending: Orthopedic Surgery | Admitting: Orthopedic Surgery

## 2022-10-31 ENCOUNTER — Encounter (HOSPITAL_COMMUNITY): Payer: Self-pay | Admitting: Orthopedic Surgery

## 2022-10-31 DIAGNOSIS — I10 Essential (primary) hypertension: Secondary | ICD-10-CM | POA: Insufficient documentation

## 2022-10-31 DIAGNOSIS — K219 Gastro-esophageal reflux disease without esophagitis: Secondary | ICD-10-CM | POA: Diagnosis not present

## 2022-10-31 DIAGNOSIS — M7542 Impingement syndrome of left shoulder: Secondary | ICD-10-CM | POA: Diagnosis not present

## 2022-10-31 DIAGNOSIS — M75102 Unspecified rotator cuff tear or rupture of left shoulder, not specified as traumatic: Secondary | ICD-10-CM

## 2022-10-31 DIAGNOSIS — M19012 Primary osteoarthritis, left shoulder: Secondary | ICD-10-CM

## 2022-10-31 DIAGNOSIS — M25812 Other specified joint disorders, left shoulder: Secondary | ICD-10-CM | POA: Diagnosis not present

## 2022-10-31 DIAGNOSIS — S43432A Superior glenoid labrum lesion of left shoulder, initial encounter: Secondary | ICD-10-CM | POA: Diagnosis not present

## 2022-10-31 DIAGNOSIS — F32A Depression, unspecified: Secondary | ICD-10-CM | POA: Diagnosis not present

## 2022-10-31 DIAGNOSIS — D649 Anemia, unspecified: Secondary | ICD-10-CM | POA: Insufficient documentation

## 2022-10-31 DIAGNOSIS — G8918 Other acute postprocedural pain: Secondary | ICD-10-CM | POA: Diagnosis not present

## 2022-10-31 DIAGNOSIS — Z8711 Personal history of peptic ulcer disease: Secondary | ICD-10-CM | POA: Diagnosis not present

## 2022-10-31 DIAGNOSIS — S46012A Strain of muscle(s) and tendon(s) of the rotator cuff of left shoulder, initial encounter: Secondary | ICD-10-CM | POA: Diagnosis not present

## 2022-10-31 DIAGNOSIS — D759 Disease of blood and blood-forming organs, unspecified: Secondary | ICD-10-CM | POA: Insufficient documentation

## 2022-10-31 DIAGNOSIS — Y9323 Activity, snow (alpine) (downhill) skiing, snow boarding, sledding, tobogganing and snow tubing: Secondary | ICD-10-CM | POA: Diagnosis not present

## 2022-10-31 HISTORY — PX: SHOULDER ARTHROSCOPY WITH ROTATOR CUFF REPAIR: SHX5685

## 2022-10-31 SURGERY — ARTHROSCOPY, SHOULDER, WITH ROTATOR CUFF REPAIR
Anesthesia: General | Laterality: Left

## 2022-10-31 MED ORDER — FENTANYL CITRATE PF 50 MCG/ML IJ SOSY: 25.0000 ug | PREFILLED_SYRINGE | INTRAMUSCULAR | Status: DC | PRN

## 2022-10-31 MED ORDER — ACETAMINOPHEN 500 MG PO TABS
ORAL_TABLET | ORAL | Status: AC
  Filled 2022-10-31: qty 2

## 2022-10-31 MED ORDER — ACETAMINOPHEN 500 MG PO TABS: 1000.0000 mg | ORAL_TABLET | Freq: Once | ORAL | Status: DC

## 2022-10-31 MED ORDER — MIDAZOLAM HCL 2 MG/2ML IJ SOLN
INTRAMUSCULAR | Status: AC
  Administered 2022-10-31: 1 mg via INTRAVENOUS
  Filled 2022-10-31: qty 2

## 2022-10-31 MED ORDER — AMISULPRIDE (ANTIEMETIC) 5 MG/2ML IV SOLN: 10.0000 mg | Freq: Once | INTRAVENOUS | Status: DC | PRN

## 2022-10-31 MED ORDER — OXYCODONE-ACETAMINOPHEN 5-325 MG PO TABS: 1.0000 | ORAL_TABLET | ORAL | 0 refills | Status: DC | PRN

## 2022-10-31 MED ORDER — CYCLOBENZAPRINE HCL 10 MG PO TABS: 10.0000 mg | ORAL_TABLET | Freq: Three times a day (TID) | ORAL | 1 refills | Status: DC | PRN

## 2022-10-31 MED ORDER — PROPOFOL 10 MG/ML IV BOLUS
INTRAVENOUS | Status: AC
  Filled 2022-10-31: qty 20

## 2022-10-31 MED ORDER — IBUPROFEN 800 MG PO TABS: 800.0000 mg | ORAL_TABLET | Freq: Three times a day (TID) | ORAL | 1 refills | Status: AC

## 2022-10-31 MED ORDER — FENTANYL CITRATE (PF) 100 MCG/2ML IJ SOLN
INTRAMUSCULAR | Status: AC
  Filled 2022-10-31: qty 2

## 2022-10-31 MED ORDER — ONDANSETRON HCL 4 MG/2ML IJ SOLN
INTRAMUSCULAR | Status: DC | PRN
  Administered 2022-10-31: 4 mg via INTRAVENOUS

## 2022-10-31 MED ORDER — FENTANYL CITRATE PF 50 MCG/ML IJ SOSY
PREFILLED_SYRINGE | INTRAMUSCULAR | Status: AC
  Administered 2022-10-31: 50 ug via INTRAVENOUS
  Filled 2022-10-31: qty 2

## 2022-10-31 MED ORDER — BUPIVACAINE LIPOSOME 1.3 % IJ SUSP
INTRAMUSCULAR | Status: DC | PRN
  Administered 2022-10-31: 10 mL via PERINEURAL

## 2022-10-31 MED ORDER — SCOPOLAMINE 1 MG/3DAYS TD PT72
MEDICATED_PATCH | TRANSDERMAL | Status: DC | PRN
  Administered 2022-10-31: 1 via TRANSDERMAL

## 2022-10-31 MED ORDER — ROCURONIUM BROMIDE 10 MG/ML (PF) SYRINGE
PREFILLED_SYRINGE | INTRAVENOUS | Status: DC | PRN
  Administered 2022-10-31: 70 mg via INTRAVENOUS

## 2022-10-31 MED ORDER — MEPERIDINE HCL 50 MG/ML IJ SOLN: 6.2500 mg | INTRAMUSCULAR | Status: DC | PRN

## 2022-10-31 MED ORDER — ONDANSETRON HCL 4 MG PO TABS: 4.0000 mg | ORAL_TABLET | Freq: Three times a day (TID) | ORAL | 0 refills | Status: AC | PRN

## 2022-10-31 MED ORDER — PROMETHAZINE HCL 25 MG/ML IJ SOLN: 6.2500 mg | INTRAMUSCULAR | Status: DC | PRN

## 2022-10-31 MED ORDER — FENTANYL CITRATE (PF) 100 MCG/2ML IJ SOLN
INTRAMUSCULAR | Status: DC | PRN
  Administered 2022-10-31: 25 ug via INTRAVENOUS
  Administered 2022-10-31: 50 ug via INTRAVENOUS
  Administered 2022-10-31: 25 ug via INTRAVENOUS

## 2022-10-31 MED ORDER — DEXAMETHASONE SODIUM PHOSPHATE 4 MG/ML IJ SOLN
INTRAMUSCULAR | Status: DC | PRN
  Administered 2022-10-31: 8 mg via INTRAVENOUS

## 2022-10-31 MED ORDER — LIDOCAINE 2% (20 MG/ML) 5 ML SYRINGE
INTRAMUSCULAR | Status: DC | PRN
  Administered 2022-10-31: 100 mg via INTRAVENOUS

## 2022-10-31 MED ORDER — SUGAMMADEX SODIUM 200 MG/2ML IV SOLN
INTRAVENOUS | Status: DC | PRN
  Administered 2022-10-31: 200 mg via INTRAVENOUS

## 2022-10-31 MED ORDER — ORAL CARE MOUTH RINSE: 15.0000 mL | Freq: Once | OROMUCOSAL | Status: AC

## 2022-10-31 MED ORDER — EPHEDRINE SULFATE (PRESSORS) 50 MG/ML IJ SOLN
INTRAMUSCULAR | Status: DC | PRN
  Administered 2022-10-31 (×3): 5 mg via INTRAVENOUS
  Administered 2022-10-31: 2.5 mg via INTRAVENOUS
  Administered 2022-10-31 (×2): 5 mg via INTRAVENOUS

## 2022-10-31 MED ORDER — PROPOFOL 10 MG/ML IV BOLUS
INTRAVENOUS | Status: DC | PRN
  Administered 2022-10-31: 250 mg via INTRAVENOUS

## 2022-10-31 MED ORDER — MIDAZOLAM HCL 2 MG/2ML IJ SOLN: 1.0000 mg | INTRAMUSCULAR | Status: DC

## 2022-10-31 MED ORDER — LACTATED RINGERS IV SOLN: INTRAVENOUS | Status: DC

## 2022-10-31 MED ORDER — CEFAZOLIN SODIUM-DEXTROSE 2-4 GM/100ML-% IV SOLN
2.0000 g | INTRAVENOUS | Status: AC
  Administered 2022-10-31: 2 g via INTRAVENOUS
  Filled 2022-10-31: qty 100

## 2022-10-31 MED ORDER — BUPIVACAINE HCL (PF) 0.5 % IJ SOLN
INTRAMUSCULAR | Status: DC | PRN
  Administered 2022-10-31: 10 mL via PERINEURAL

## 2022-10-31 MED ORDER — VANCOMYCIN HCL 1000 MG IV SOLR
INTRAVENOUS | Status: AC
  Filled 2022-10-31: qty 20

## 2022-10-31 MED ORDER — CHLORHEXIDINE GLUCONATE 0.12 % MT SOLN
15.0000 mL | Freq: Once | OROMUCOSAL | Status: AC
  Administered 2022-10-31: 15 mL via OROMUCOSAL

## 2022-10-31 MED ORDER — FENTANYL CITRATE PF 50 MCG/ML IJ SOSY: 50.0000 ug | PREFILLED_SYRINGE | INTRAMUSCULAR | Status: DC

## 2022-10-31 SURGICAL SUPPLY — 58 items
ANCH SUT SWLK 19.1X4.75 VT (Anchor) ×4 IMPLANT
ANCHOR PEEK 4.75X19.1 SWLK C (Anchor) IMPLANT
BAG COUNTER SPONGE SURGICOUNT (BAG) ×1 IMPLANT
BAG SPNG CNTER NS LX DISP (BAG) ×1
BLADE EXCALIBUR 4.0X13 (MISCELLANEOUS) ×1 IMPLANT
BOOTIES KNEE HIGH SLOAN (MISCELLANEOUS) ×2 IMPLANT
BURR OVAL 8 FLU 4.0X13 (MISCELLANEOUS) ×1 IMPLANT
CANNULA ACUFLEX KIT 5X76 (CANNULA) ×1 IMPLANT
CANNULA DRILOCK 5.0X75 (CANNULA) IMPLANT
CANNULA TWIST IN 8.25X7CM (CANNULA) IMPLANT
CONNECTOR 5 IN 1 STRAIGHT STRL (MISCELLANEOUS) ×1 IMPLANT
COOLER ICEMAN CLASSIC (MISCELLANEOUS) IMPLANT
DISSECTOR  3.8MM X 13CM (MISCELLANEOUS) ×1
DISSECTOR 3.8MM X 13CM (MISCELLANEOUS) ×1 IMPLANT
DRAPE INCISE 23X17 STRL (DRAPES) ×1 IMPLANT
DRAPE INCISE IOBAN 23X17 STRL (DRAPES) ×1 IMPLANT
DRAPE INCISE IOBAN 66X45 STRL (DRAPES) ×1 IMPLANT
DRAPE ORTHO SPLIT 77X108 STRL (DRAPES)
DRAPE STERI 35X30 U-POUCH (DRAPES) ×1 IMPLANT
DRAPE SURG 17X11 SM STRL (DRAPES) ×1 IMPLANT
DRAPE SURG ORHT 6 SPLT 77X108 (DRAPES) IMPLANT
DRAPE U-SHAPE 47X51 STRL (DRAPES) ×1 IMPLANT
DURAPREP 26ML APPLICATOR (WOUND CARE) ×1 IMPLANT
GAUZE PAD ABD 8X10 STRL (GAUZE/BANDAGES/DRESSINGS) ×2 IMPLANT
GAUZE SPONGE 4X4 12PLY STRL (GAUZE/BANDAGES/DRESSINGS) ×1 IMPLANT
GLOVE BIO SURGEON STRL SZ7.5 (GLOVE) ×1 IMPLANT
GLOVE BIO SURGEON STRL SZ8 (GLOVE) ×1 IMPLANT
GLOVE SS BIOGEL STRL SZ 7 (GLOVE) ×3 IMPLANT
GLOVE SS BIOGEL STRL SZ 7.5 (GLOVE) ×1 IMPLANT
GOWN STRL SURGICAL XL XLNG (GOWN DISPOSABLE) ×2 IMPLANT
KIT BASIN OR (CUSTOM PROCEDURE TRAY) ×1 IMPLANT
KIT SHOULDER TRACTION (DRAPES) ×1 IMPLANT
KIT TURNOVER KIT A (KITS) IMPLANT
MANIFOLD NEPTUNE II (INSTRUMENTS) ×1 IMPLANT
NDL HD SCORPION MEGA LOADER (NEEDLE) IMPLANT
NDL SCORPION MULTI FIRE (NEEDLE) IMPLANT
NEEDLE SCORPION MULTI FIRE (NEEDLE) IMPLANT
NS IRRIG 1000ML POUR BTL (IV SOLUTION) ×1 IMPLANT
PACK ARTHROSCOPY WL (CUSTOM PROCEDURE TRAY) ×1 IMPLANT
PAD ARMBOARD 7.5X6 YLW CONV (MISCELLANEOUS) ×1 IMPLANT
PAD COLD SHLDR WRAP-ON (PAD) IMPLANT
PROBE APOLLO 90XL (SURGICAL WAND) ×1 IMPLANT
SLING ARM FOAM STRAP MED (SOFTGOODS) IMPLANT
SLING ULTRA II L (ORTHOPEDIC SUPPLIES) IMPLANT
SPONGE T-LAP 4X18 ~~LOC~~+RFID (SPONGE) ×1 IMPLANT
STRIP CLOSURE SKIN 1/2X4 (GAUZE/BANDAGES/DRESSINGS) ×1 IMPLANT
SUT FIBERWIRE #2 38 T-5 BLUE (SUTURE)
SUT MNCRL AB 3-0 PS2 18 (SUTURE) ×1 IMPLANT
SUT PDS AB 0 CT 36 (SUTURE) IMPLANT
SUT TIGER TAPE 7 IN WHITE (SUTURE) ×1 IMPLANT
SUTURE FIBERWR #2 38 T-5 BLUE (SUTURE) IMPLANT
TAPE FIBER 2MM 7IN #2 BLUE (SUTURE) IMPLANT
TAPE PAPER 3X10 WHT MICROPORE (GAUZE/BANDAGES/DRESSINGS) ×1 IMPLANT
TOWEL OR 17X26 10 PK STRL BLUE (TOWEL DISPOSABLE) ×1 IMPLANT
TUBING ARTHROSCOPY IRRIG 16FT (MISCELLANEOUS) ×1 IMPLANT
WAND ABLATOR APOLLO I90 (BUR) IMPLANT
WATER STERILE IRR 1000ML POUR (IV SOLUTION) ×1 IMPLANT
YANKAUER SUCT BULB TIP 10FT TU (MISCELLANEOUS) ×1 IMPLANT

## 2022-10-31 NOTE — Anesthesia Procedure Notes (Signed)
Anesthesia Regional Block: Interscalene brachial plexus block   Pre-Anesthetic Checklist: , timeout performed,  Correct Patient, Correct Site, Correct Laterality,  Correct Procedure, Correct Position, site marked,  Risks and benefits discussed,  Surgical consent,  Pre-op evaluation,  At surgeon's request and post-op pain management  Laterality: Upper and Left  Prep: chloraprep       Needles:  Injection technique: Single-shot  Needle Type: Stimulator Needle - 40     Needle Length: 4cm  Needle Gauge: 22     Additional Needles:   Procedures:,,,, ultrasound used (permanent image in chart),,    Narrative:  Start time: 10/31/2022 11:32 AM End time: 10/31/2022 11:52 AM Injection made incrementally with aspirations every 5 mL.  Performed by: Personally  Anesthesiologist: Nolon Nations, MD  Additional Notes: BP cuff, SpO2 and EKG monitors applied. Sedation begun. Nerve location verified with ultrasound. Anesthetic injected incrementally, slowly, and after neg aspirations under direct u/s guidance. Good perineural spread. Tolerated well.

## 2022-10-31 NOTE — Anesthesia Postprocedure Evaluation (Signed)
Anesthesia Post Note  Patient: [name]  Procedure(s) Performed: Left shoulder arthroscopy, debridement, subacromial decompression, distal clavicle resection, rotator cuff repair (Left)     Patient location during evaluation: Phase II Anesthesia Type: General Level of consciousness: awake and alert, patient cooperative and oriented Pain management: pain level controlled Vital Signs Assessment: post-procedure vital signs reviewed and stable Respiratory status: nonlabored ventilation, spontaneous breathing and respiratory function stable Cardiovascular status: blood pressure returned to baseline and stable Postop Assessment: no apparent nausea or vomiting, able to ambulate and adequate PO intake Anesthetic complications: no   No notable events documented.  Last Vitals:  Vitals:   10/31/22 1500 10/31/22 1515  BP: (!) 143/93 (!) 166/99  Pulse: 95 85  Resp: 20 15  Temp:  36.5 C  SpO2: 93% 93%    Last Pain:  Vitals:   10/31/22 1515  TempSrc:   PainSc: 0-No pain                 Zahi Plaskett,E. Talena Neira

## 2022-10-31 NOTE — Anesthesia Procedure Notes (Signed)
Procedure Name: Intubation Date/Time: 10/31/2022 12:45 PM  Performed by: Deliah Boston, CRNAPre-anesthesia Checklist: Patient identified, Emergency Drugs available, Suction available and Patient being monitored Patient Re-evaluated:Patient Re-evaluated prior to induction Oxygen Delivery Method: Circle system utilized Preoxygenation: Pre-oxygenation with 100% oxygen Induction Type: IV induction Ventilation: Mask ventilation without difficulty Laryngoscope Size: Glidescope and 3 Grade View: Grade I Tube type: Oral Tube size: 7.5 mm Number of attempts: 1 Airway Equipment and Method: Rigid stylet and Video-laryngoscopy Placement Confirmation: ETT inserted through vocal cords under direct vision, positive ETCO2 and breath sounds checked- equal and bilateral Secured at: 23 cm Tube secured with: Tape Dental Injury: Teeth and Oropharynx as per pre-operative assessment  Comments: Elective glide scope, previous intubation was an elective glides cope due to previous intubations requiring multiple DVL"s

## 2022-10-31 NOTE — Anesthesia Preprocedure Evaluation (Addendum)
Anesthesia Evaluation  Patient identified by MRN, date of birth, ID band Patient awake    Reviewed: Allergy & Precautions, NPO status , Patient's Chart, lab work & pertinent test results  History of Anesthesia Complications (+) PONV and history of anesthetic complications  Airway Mallampati: II  TM Distance: >3 FB Neck ROM: Full    Dental no notable dental hx. (+) Dental Advisory Given, Teeth Intact   Pulmonary asthma , sleep apnea and Continuous Positive Airway Pressure Ventilation , pneumonia, neg recent URI   Pulmonary exam normal breath sounds clear to auscultation       Cardiovascular hypertension, Normal cardiovascular exam Rhythm:Regular Rate:Normal  ECG 07/23/22:  Sinus bradycardia (HR 59) Otherwise normal ECG When compared with ECG of 18-Oct-2015 13:11, No significant change was found   Neuro/Psych  Headaches PSYCHIATRIC DISORDERS  Depression       GI/Hepatic PUD,GERD  ,,Ulcerative colitis   Endo/Other  Obesity   Renal/GU      Musculoskeletal  (+) Arthritis ,    Abdominal  (+) + obese  Peds  Hematology  (+) Blood dyscrasia, anemia   Anesthesia Other Findings   Reproductive/Obstetrics                             Anesthesia Physical Anesthesia Plan  ASA: 2  Anesthesia Plan: General   Post-op Pain Management: Tylenol PO (pre-op)* and Regional block*   Induction: Intravenous  PONV Risk Score and Plan: 3 and Ondansetron, Dexamethasone, Treatment may vary due to age or medical condition and Midazolam  Airway Management Planned: Oral ETT  Additional Equipment:   Intra-op Plan:   Post-operative Plan: Extubation in OR  Informed Consent: I have reviewed the patients History and Physical, chart, labs and discussed the procedure including the risks, benefits and alternatives for the proposed anesthesia with the patient or authorized representative who has indicated his/her  understanding and acceptance.     Dental advisory given  Plan Discussed with: CRNA  Anesthesia Plan Comments:         Anesthesia Quick Evaluation

## 2022-10-31 NOTE — Op Note (Signed)
10/31/2022  2:22 PM  PATIENT:   Patrick Oconnor  57 y.o. male  PRE-OPERATIVE DIAGNOSIS:  Left shoulder rotator cuff tear, acromioclavicular osteoarthritis, impingement  POST-OPERATIVE DIAGNOSIS: Same with additional finding of degenerative labral tear  PROCEDURE:  1.  Left shoulder examination under anesthesia  2.  Left shoulder glenohumeral joint diagnostic arthroscopy  3.  Debridement of extensive degenerative labral tear and limited synovectomy  4.  Arthroscopic subacromial decompression and bursectomy  5.  Arthroscopic distal clavicle resection  6.  Arthroscopic rotator cuff repair utilizing a double row suture bridge repair construct  SURGEON:  Mekhi Sonn, Metta Clines M.D.  ASSISTANTS: Jenetta Loges, PA-C  ANESTHESIA:   General endotracheal and interscalene block with Exparel  EBL: Minimal  SPECIMEN: None  Drains: None   PATIENT DISPOSITION:  PACU - hemodynamically stable.    PLAN OF CARE: Discharge to home after PACU  Brief history:  Patient is a 57 year old gentleman injured his left shoulder while snow skiing out in [place] sustaining a large moderately retracted tear of the entire superior rotator cuff.  He does have underlying significant impingement as well as symptomatic AC joint arthritis.  He is brought to the operating this time for planned left shoulder arthroscopy as described below.  Preoperatively the patient was counseled regarding treatment options as well as potential risks versus benefits there.  Possible surgical complications were reviewed including the potential for bleeding, infection, neurovascular injury, persistence of pain, loss of motion, anesthetic complication, recurrence of rotator cuff tear, possible need for additional surgery.  They understand, and accept, and agree with the planned procedure.  Procedure detail:  After undergoing routine preop evaluation the patient received prophylactic antibiotics and interscalene block with  Exparel was established in the holding area by the anesthesia department.  Subsequently placed spine on the operating table and underwent the smooth induction of a general endotracheal anesthesia.  Turned to the right lateral decubitus position on the beanbag and appropriately padded and protected.  Left shoulder examination under anesthesia demonstrated full motion.  Left arm suspended 70 degrees of abduction with 15 pounds of traction and the left shoulder girdle region was sterilely prepped and draped in standard fashion.  Timeout was called.  A posterior portal established into the glenohumeral joint and an anterior portal established under direct visualization.  Articular surfaces showed some mild diffuse fibrillations which were debrided with a shaver but no significant chondral thinning or loss.  There was diffuse synovitis and limited synovectomy was performed.  There is degenerative labral tearing anteriorly and superiorly which is debrided with a shaver back to stable margin.  There is absence of the long head biceps tendon related to remote rupture.  There is an obvious full-thickness tear of the supra and infraspinatus.  Remaining inspection within the glenohumeral joint showed no obvious additional pathologies.  Fluid and instruments were removed.  The arm was then dropped down to 30 degrees of abduction with the arthroscope introduced in the subacromial space of the posterior portal and a direct lateral portal established in the subacromial space.  Abundant dense bursal tissue multiple adhesions were encountered and these were all divided and excised with a combination of the shaver and the Stryker wand.  The wand was then used to remove the periosteum from the undersurface of the anterior half of the acromion and a subacromial decompression was performed with a bur crating a type I morphology.  A portal was then established directly anterior to the distal clavicle and the distal clavicle resection was  performed with a bur removing approxi-1 cm of bone with care taken to confirm visualization of the entire circumference of the distal clavicle to ensure adequate removal of bone.  At this point the subacromial/subdeltoid bursectomy was then completed.  The rotator cuff tear was readily identified and the shaver was used to trim the margin of the tear back to healthy tissue.  The Arthrex wand was used to remove the residual soft tissues at the tuberosity and the bur was used to abrade the bone to bleeding bed with the footprint approximate 4 cm in width.  Through a stab wound off the lateral margin of the acromion we placed 2 Arthrex peek swivel lock suture anchors equidistant across the width of the tear of the suture limbs were all then passed equidistant across the width of the rotator cuff tear using the scorpion suture passer.  All 8 suture limbs were then passed in an alternating fashion into 2 lateral row anchors crating a double row repair which allowed excellent apposition of the rotator cuff tendon footprint to the tuberosity.  Suture limbs were all then clipped.  Final hemostasis was obtained.  Fluid and incisura removed.  The portals were closed with a Monocryl and Steri-Strip.  A dry dressing was then taped about the left shoulder and left arm was placed into a sling immobilizer with abduction pillow.  The patient was then awakened, extubated, and taken to the recovery room in stable condition.  Marin Shutter MD   Contact # [phone]

## 2022-10-31 NOTE — Discharge Instructions (Signed)
   Kevin M. Supple, M.D., F.A.A.O.S. Orthopaedic Surgery Specializing in Arthroscopic and Reconstructive Surgery of the Shoulder [phone] [address] - [place], [place] [address] - Fax [phone]  POST-OP SHOULDER ARTHROSCOPIC ROTATOR CUFF  REPAIR INSTRUCTIONS  1. Call the office at [phone] to schedule your first post-op appointment 7-10 days from the date of your surgery.  2. Leave the steri-strips in place over your incisions when performing dressing changes and showering. You may remove your dressings and begin showering 72 hours from surgery. You can expect drainage that is clear to bloody in nature that occasionally will soak through your dressings. If this occurs go ahead and perform a dressing change. The drainage should lessen daily and when there is no drainage from your incisions feel free to go without a dressing.  3. Wear your sling/immobilizer at all times except to perform the exercises below or to occasionally let your arm dangle by your side to stretch your elbow. You also need to sleep in your sling immobilizer until instructed otherwise.  4. Range of motion to your elbow, wrist, and hand are encouraged 3-5 times daily. Exercise to your hand and fingers helps to reduce swelling you may experience.  5. Utilize ice to the shoulder 3-4 times minimum a day and additionally if you are experiencing pain.  6. You may one-armed drive when safely off of narcotics and muscle relaxants. You may use your hand that is in the sling to support the steering wheel only. However, should it be your right arm that is in the sling it is not to be used for gear shifting in a manual transmission.  7. Pain control following an exparel block  To help control your post-operative pain you received a nerve block  performed with Exparel which is a long acting anesthetic (numbing agent) which can provide pain relief and sensations of numbness (and relief of pain) in the operative  shoulder and arm for up to 3 days. Sometimes it provides mixed relief, meaning you may still have numbness in certain areas of the arm but can still be able to move  parts of that arm, hand, and fingers. We recommend that your prescribed pain medications  be used as needed. We do not feel it is necessary to "pre medicate" and "stay ahead" of pain.  Taking narcotic pain medications when you are not having any pain can lead to unnecessary and potentially dangerous side effects.    8. Pain medications can produce constipation along with their use. If you experience this, the use of an over the counter stool softener or laxative daily is recommended.   9. For additional questions or concerns, please do not hesitate to call the office. If after hours there is an answering service to forward your concerns to the physician on call.  POST-OP EXERCISES  Pendulum Exercises  Perform pendulum exercises while standing and bending at the waist. Support your uninvolved arm on a table or chair and allow your operated arm to hang freely. Make sure to do these exercises passively - not using you shoulder muscle.  Repeat 20 times. Do 3 sessions per day.   

## 2022-10-31 NOTE — H&P (Signed)
Patrick Oconnor    Chief Complaint: Left shoulder rotator cuff tear, acromioclavicular osteoarthritis, impingement HPI: The patient is a 58 y.o. male status post fall onto the left elbow and shoulder while snow skiing in [place] with immediate complaints of severe left shoulder pain and inability to elevate the left arm.  Subsequent evaluation in our office has confirmed severe weakness with inability to overhead elevate.  MRI scan completed confirming a large traumatic full-thickness rotator cuff tear as well as severe bony impingement and advanced AC joint arthritis.  Patient is brought to the operating room at this time for planned left shoulder arthroscopy with rotator cuff repair, subacromial decompression, and distal clavicle resection.  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  Past Medical History:  Diagnosis Date   Anemia    Asthma, exercise induced    Degenerative disc disease, lumbar    Depression    Environmental allergies    GERD (gastroesophageal reflux disease)    History of bronchitis    Hypertension    IBS (irritable bowel syndrome)    Pneumonia    hx of   PONV (postoperative nausea and vomiting)    Sleep apnea 04/2007   Started CPAP; wears CPAP nightly   Tinnitus aurium, left    Ulcerative colitis [facility])       Past Surgical History:  Procedure Laterality Date   ACROMIO-CLAVICULAR JOINT REPAIR Right 04/15/2013   Procedure: RIGHT SHOULDER ACROMIO-CLAVICULAR RECONSTRUCTION WITH ALLOGRAFT   ;  Surgeon: Marin Shutter, MD;  Location: [place];  Service: Orthopedics;  Laterality: Right;   ACROMIO-CLAVICULAR JOINT REPAIR Right 06/03/2013   Procedure: ACROMIO-CLAVICULAR JOINT REPAIR;  Surgeon: Marin Shutter, MD;  Location: [place];  Service: Orthopedics;  Laterality: Right;   ACROMIO-CLAVICULAR JOINT REPAIR Right 10/19/2015   Procedure: OPEN RIGHT SHOULDER ACROMIO-CLAVICULAR  JOINT RECONSTRUCTION ;  Surgeon: Justice Britain, MD;  Location: [place];  Service: Orthopedics;  Laterality: Right;   COLONOSCOPY  08/12/2005   "multiple"   CYST EXCISION  08/03/2022   Left perineal area   ESOPHAGOGASTRODUODENOSCOPY  10/11/2007   normal; multiple   ESOPHAGOGASTRODUODENOSCOPY (EGD) WITH PROPOFOL N/A 01/22/2016   Procedure: ESOPHAGOGASTRODUODENOSCOPY (EGD) WITH PROPOFOL;  Surgeon: Laurence Spates, MD;  Location: [facility];  Service: Endoscopy;  Laterality: N/A;   INCISION AND DRAINAGE Right 06/03/2013   Procedure: INCISION AND DRAINAGE RIGHT SHOULDER;  Surgeon: Marin Shutter, MD;  Location: [place];  Service: Orthopedics;  Laterality: Right;   INCISION AND DRAINAGE ABSCESS N/A 07/25/2022   Procedure: INCISION AND DRAINAGE OF SCROTAL ABSCESS;  Surgeon: Abbie Sons, MD;  Location: [facility];  Service: Urology;  Laterality: N/A;   pilonidal cystectomy  42003   x 2   PYLOROPLASTY  infant   pyloric stenosis   SEPTOPLASTY     SHOULDER OPEN ROTATOR CUFF REPAIR Right 10/19/2015   Procedure: OPEN RIGHT SHOULDER ROTATOR CUFF REPAIR ;  Surgeon: Justice Britain, MD;  Location: [place];  Service: Orthopedics;  Laterality: Right;   thrombosed external hemorrhoid  04/13/2007   incised   TONSILLECTOMY AND ADENOIDECTOMY      Family History  Problem Relation Age of Onset   Cancer Father 62       prostate Stage 4   Prostate cancer Father    Cancer Maternal Grandmother        breast   Cancer Mother    Cancer Paternal Grandmother  Colon   Colon cancer Paternal Grandmother     Social History:  reports that he has never smoked. He has never been exposed to tobacco smoke. He has never used smokeless tobacco. He reports current alcohol use of about 0.3 standard drinks of alcohol per week. He reports that he does not use drugs.  BMI: Estimated body mass index is 32.85 kg/m as calculated from the following:   Height as of this encounter: 6\' 1"  (1.854 m).   Weight as of this  encounter: 112.9 kg.  Lab Results  Component Value Date   ALBUMIN 4.5 09/12/2022   Diabetes: Patient does not have a diagnosis of diabetes.     Smoking Status:   reports that he has never smoked. He has never been exposed to tobacco smoke. He has never used smokeless tobacco.     Medications Prior to Admission  Medication Sig Dispense Refill   acetaminophen (TYLENOL) 500 MG tablet Take 1,000 mg by mouth every 6 (six) hours as needed for moderate pain.     albuterol (VENTOLIN HFA) 108 (90 Base) MCG/ACT inhaler Inhale 1-2 puffs into the lungs every 6 (six) hours as needed for wheezing (prior to exercise.). 8.5 g 2   bisacodyl (DULCOLAX) 5 MG EC tablet 5 mg daily as needed for moderate constipation.     Cholecalciferol (VITAMIN D) 125 MCG (5000 UT) CAPS Take 5,000 Units by mouth daily.     citalopram (CELEXA) 20 MG tablet Take 1 tablet (20 mg total) by mouth daily. 90 tablet 3   colchicine 0.6 MG tablet Take 1 tablet (0.6 mg total) by mouth daily as needed. Okay to fill with mitigare, colchicine, colcrys.  Take with food. 30 tablet 0   ferrous sulfate 325 (65 FE) MG tablet Take 1 tablet (325 mg total) by mouth daily.     fluticasone (FLONASE) 50 MCG/ACT nasal spray Place 1 spray into both nostrils daily. 16 g 0   glucosamine-chondroitin 500-400 MG tablet Take 2 tablets by mouth in the morning.     ibuprofen (ADVIL) 200 MG tablet Take 400-800 mg by mouth every 8 (eight) hours as needed for moderate pain.     loratadine (CLARITIN) 10 MG tablet Take 10 mg by mouth daily.     mesalamine (APRISO) 0.375 g 24 hr capsule Take 4 capsules (1.5 g total) by mouth every morning. (Patient taking differently: Take 0.75 g by mouth every morning.) 360 capsule 3   pantoprazole (PROTONIX) 20 MG tablet Take 1 tablet (20 mg total) by mouth daily. 90 tablet 3   polyethylene glycol powder (GLYCOLAX/MIRALAX) powder Take 17 g by mouth 2 (two) times daily as needed (For constipation.).     Probiotic Product  (PROBIOTIC DAILY PO) Take 1 capsule by mouth daily as needed (For digestive health.).      rOPINIRole (REQUIP) 1 MG tablet Take 1-2 tablets (1-2 mg total) by mouth daily as needed (for RLS). 180 tablet 3   sennosides-docusate sodium (SENOKOT-S) 8.6-50 MG tablet Take 1-2 tablets by mouth daily as needed for constipation.     Tetrahydroz-Dextran-PEG-Povid (VISINE ADVANCED RELIEF OP) Apply 1-2 drops to eye as needed (For eye irritation.).        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  Otherwise he has profoundly restricted motion and global weakness with positive drop arm and positive impingement sign.  Neurovascular intact.  Radiographs  Plain films of the left shoulder confirm  severe AC joint arthritis.  Marked bony impingement.  Humeral head is not high riding.  Recent MRI confirms full-thickness traumatic rotator cuff tear.  Vitals  Temp:  [97.6 F (36.4 C)] 97.6 F (36.4 C) (03/21 1007) Pulse Rate:  [57] 57 (03/21 1007) Resp:  [16] 16 (03/21 1007) BP: (170)/(97) (P) 161/96 (03/21 1025) SpO2:  [97 %] 97 % (03/21 1007) Weight:  [112.9 kg] 112.9 kg (03/21 1021)  Assessment/Plan  Impression: Left shoulder rotator cuff tear, acromioclavicular osteoarthritis, impingement  Plan of Action: Procedure(s): Left shoulder arthroscopy, debridement, subacromial decompression, distal clavicle resection, rotator cuff repair  Tajanay Hurley M Shelba Susi 10/31/2022, 11:38 AM Contact # [phone]

## 2022-10-31 NOTE — Transfer of Care (Signed)
Immediate Anesthesia Transfer of Care Note  Patient: Patrick Oconnor  Procedure(s) Performed: Procedure(s) with comments: Left shoulder arthroscopy, debridement, subacromial decompression, distal clavicle resection, rotator cuff repair (Left) - 122min  Patient Location: PACU  Anesthesia Type:General and Regional  Level of Consciousness: Patient easily awoken, sedated, comfortable, cooperative, following commands, responds to stimulation.   Airway & Oxygen Therapy: Patient spontaneously breathing, ventilating well, oxygen via simple oxygen mask.  Post-op Assessment: Report given to PACU RN, vital signs reviewed and stable, moving all extremities.   Post vital signs: Reviewed and stable.  Complications: No apparent anesthesia complications  Last Vitals:  Vitals Value Taken Time  BP 152/95 10/31/22 1450  Temp 36.5 C 10/31/22 1445  Pulse 87 10/31/22 1449  Resp 16 10/31/22 1449  SpO2 98 % 10/31/22 1449  Vitals shown include unvalidated device data.  Last Pain:  Vitals:   10/31/22 1445  TempSrc:   PainSc: Asleep      Patients Stated Pain Goal: 3 ([id number])  Complications: No notable events documented.

## 2022-11-01 ENCOUNTER — Encounter (HOSPITAL_COMMUNITY): Payer: Self-pay | Admitting: Orthopedic Surgery

## 2022-11-04 ENCOUNTER — Ambulatory Visit
Admission: EM | Admit: 2022-11-04 | Discharge: 2022-11-04 | Disposition: A | Discharge status: Home / Self Care | Payer: 59

## 2022-11-04 ENCOUNTER — Telehealth: Payer: Self-pay | Admitting: Family Medicine

## 2022-11-04 DIAGNOSIS — R0602 Shortness of breath: Secondary | ICD-10-CM | POA: Diagnosis not present

## 2022-11-04 NOTE — Discharge Instructions (Signed)
Follow up with your primary care provider if your symptoms are worsening or not improving.       

## 2022-11-04 NOTE — Telephone Encounter (Signed)
Patient was called back and sent to access nurse to be triaged.

## 2022-11-04 NOTE — ED Triage Notes (Signed)
Patient to Urgent Care with complaints of shortness of breath that started on Friday.  Reports a recent shoulder surgery (Thursday). Reports he had some throat irritation/ post nasal drip following the surgery from the ET tube. Feels like now it is difficult to take a deep breath/ cough. States it feels like his lungs can't expand enough/ take a deep enough breath. Improves when resting/ standing still.  Denies any chest pain. Denies any fevers.

## 2022-11-04 NOTE — Telephone Encounter (Signed)
Patient had shoulder surgery,he called in today stating that he has been having some sob,and he would like to know if it could possibly be from the nerve block from his shoulder?

## 2022-11-04 NOTE — ED Provider Notes (Signed)
Roderic Palau    CSN: [id number] Arrival date & time: 11/04/22  1521      History   Chief Complaint Chief Complaint  Patient presents with   Shortness of Breath    HPI ONEAL COLLASO is a 57 y.o. male.    Shortness of Breath   Patient presents urgent care with complaint of shortness of breath that started 3 days ago following shoulder surgery on the prior day.  Patient reports throat irritation following withdrawal of ET tube after surgery.  He endorses difficulty taking a deep breath and coughing.  Feels as if his lungs "cannot expand enough".  He reports improvement when resting.  Denies chest pain.  Denies fever.  Endorses recent history of constipation following surgery and feels as if she may have been bloated, restricting his diaphragm movement.  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  Past Medical History:  Diagnosis Date   Anemia    Asthma, exercise induced    Degenerative disc disease, lumbar    Depression    Environmental allergies    GERD (gastroesophageal reflux disease)    History of bronchitis    Hypertension    IBS (irritable bowel syndrome)    Pneumonia    hx of   PONV (postoperative nausea and vomiting)    Sleep apnea 04/2007   Started CPAP; wears CPAP nightly   Tinnitus aurium, left    Ulcerative colitis [facility])     Patient Active Problem List   Diagnosis Date Noted   FH: CAD (coronary artery disease) 09/22/2022   Gout 02/10/2022   Vitamin D deficiency 02/10/2022   Neck pain 08/19/2021   Scrotal mass 09/20/2020   Ulcerative colitis ([place])    Pulsatile tinnitus 07/29/2018   Advance care planning 08/08/2017   Paresthesia 06/29/2016   Hypertriglyceridemia 06/28/2016   Urinary hesitancy 06/28/2016   Headache 04/27/2015   Anemia, iron deficiency 02/28/2015   Arthritis 02/21/2015   Constipation 07/04/2014   Routine general medical examination at a health care facility 01/07/2014   FH: prostate cancer 02/09/2013   Fatigue  02/09/2013   Low back pain potentially associated with radiculopathy 06/23/2012   BENIGN POSITIONAL VERTIGO, HX OF 05/16/2009   HYPERTENSION, MILD 07/26/2008   RESTLESS LEG SYNDROME, MILD 09/29/2007   Sleep apnea 03/17/2007   GLUCOSE INTOLERANCE 10/09/2006   Depression 10/09/2006   Asthma 10/09/2006   GASTROESOPHAGEAL REFLUX, NO ESOPHAGITIS 10/09/2006   IRRITABLE BOWEL SYNDROME 10/09/2006    Past Surgical History:  Procedure Laterality Date   ACROMIO-CLAVICULAR JOINT REPAIR Right 04/15/2013   Procedure: RIGHT SHOULDER ACROMIO-CLAVICULAR RECONSTRUCTION WITH ALLOGRAFT   ;  Surgeon: Marin Shutter, MD;  Location: [place];  Service: Orthopedics;  Laterality: Right;   ACROMIO-CLAVICULAR JOINT REPAIR Right 06/03/2013   Procedure: ACROMIO-CLAVICULAR JOINT REPAIR;  Surgeon: Marin Shutter, MD;  Location: [place];  Service: Orthopedics;  Laterality: Right;   ACROMIO-CLAVICULAR JOINT REPAIR Right 10/19/2015   Procedure: OPEN RIGHT SHOULDER ACROMIO-CLAVICULAR JOINT RECONSTRUCTION ;  Surgeon: Justice Britain, MD;  Location: [place];  Service: Orthopedics;  Laterality: Right;   COLONOSCOPY  08/12/2005   "multiple"   CYST EXCISION  08/03/2022   Left perineal area   ESOPHAGOGASTRODUODENOSCOPY  10/11/2007   normal; multiple   ESOPHAGOGASTRODUODENOSCOPY (EGD) WITH PROPOFOL N/A 01/22/2016   Procedure: ESOPHAGOGASTRODUODENOSCOPY (EGD) WITH PROPOFOL;  Surgeon: Laurence Spates, MD;  Location: [facility];  Service: Endoscopy;  Laterality: N/A;   INCISION AND DRAINAGE Right 06/03/2013   Procedure: INCISION AND DRAINAGE RIGHT SHOULDER;  Surgeon: Marin Shutter, MD;  Location: [place];  Service: Orthopedics;  Laterality: Right;   INCISION AND DRAINAGE ABSCESS N/A 07/25/2022   Procedure: INCISION AND DRAINAGE OF SCROTAL ABSCESS;  Surgeon: Abbie Sons, MD;  Location: [facility];  Service: Urology;  Laterality: N/A;   pilonidal cystectomy  42003   x 2   PYLOROPLASTY  infant   pyloric stenosis   SEPTOPLASTY      SHOULDER ARTHROSCOPY WITH ROTATOR CUFF REPAIR Left 10/31/2022   Procedure: Left shoulder arthroscopy, debridement, subacromial decompression, distal clavicle resection, rotator cuff repair;  Surgeon: Justice Britain, MD;  Location: [facility];  Service: Orthopedics;  Laterality: Left;  173min   SHOULDER OPEN ROTATOR CUFF REPAIR Right 10/19/2015   Procedure: OPEN RIGHT SHOULDER ROTATOR CUFF REPAIR ;  Surgeon: Justice Britain, MD;  Location: [place];  Service: Orthopedics;  Laterality: Right;   thrombosed external hemorrhoid  04/13/2007   incised   TONSILLECTOMY AND ADENOIDECTOMY         Home Medications    Prior to Admission medications   Medication Sig Start Date End Date Taking? Authorizing Provider  Probiotic Product (PROBIOTIC DAILY PO) Take 1 capsule by mouth daily as needed (For digestive health.).    Yes [provider]  acetaminophen (TYLENOL) 500 MG tablet Take 1,000 mg by mouth every 6 (six) hours as needed for moderate pain.    [provider]  albuterol (VENTOLIN HFA) 108 (90 Base) MCG/ACT inhaler Inhale 1-2 puffs into the lungs every 6 (six) hours as needed for wheezing (prior to exercise.). 08/16/21   Tonia Ghent, MD  bisacodyl (DULCOLAX) 5 MG EC tablet 5 mg daily as needed for moderate constipation. 12/09/19   [provider]  Cholecalciferol (VITAMIN D) 125 MCG (5000 UT) CAPS Take 5,000 Units by mouth daily.    [provider]  citalopram (CELEXA) 20 MG tablet Take 1 tablet (20 mg total) by mouth daily. 09/19/22   Tonia Ghent, MD  colchicine 0.6 MG tablet Take 1 tablet (0.6 mg total) by mouth daily as needed. Okay to fill with mitigare, colchicine, colcrys.  Take with food. 02/07/22   Tonia Ghent, MD  cyclobenzaprine (FLEXERIL) 10 MG tablet Take 1 tablet (10 mg total) by mouth 3 (three) times daily as needed for muscle spasms. 10/31/22   Shuford, Olivia Mackie, PA-C  ferrous sulfate 325 (65 FE) MG tablet Take 1 tablet (325 mg total) by mouth daily.  11/01/19   Tonia Ghent, MD  fluticasone [facility]) 50 MCG/ACT nasal spray Place 1 spray into both nostrils daily. 01/11/22   Raspet, Derry Skill, PA-C  glucosamine-chondroitin 500-400 MG tablet Take 2 tablets by mouth in the morning.    [provider]  ibuprofen (ADVIL) 800 MG tablet Take 1 tablet (800 mg total) by mouth 3 (three) times daily. 10/31/22   Shuford, Olivia Mackie, PA-C  loratadine (CLARITIN) 10 MG tablet Take 10 mg by mouth daily.    [provider]  mesalamine (APRISO) 0.375 g 24 hr capsule Take 4 capsules (1.5 g total) by mouth every morning. Patient taking differently: Take 0.75 g by mouth every morning. 11/07/21     ondansetron (ZOFRAN) 4 MG tablet Take 1 tablet (4 mg total) by mouth every 8 (eight) hours as needed for nausea or vomiting. 10/31/22   Shuford, Olivia Mackie, PA-C  oxyCODONE-acetaminophen (PERCOCET) 5-325 MG tablet Take 1 tablet by mouth every 4 (four) hours as needed (max 6 q). 10/31/22   Shuford, Olivia Mackie, PA-C  pantoprazole (PROTONIX)  20 MG tablet Take 1 tablet (20 mg total) by mouth daily. 09/19/22   Tonia Ghent, MD  polyethylene glycol powder (GLYCOLAX/MIRALAX) powder Take 17 g by mouth 2 (two) times daily as needed (For constipation.). Patient not taking: Reported on 11/04/2022 08/07/17   Tonia Ghent, MD  rOPINIRole (REQUIP) 1 MG tablet Take 1-2 tablets (1-2 mg total) by mouth daily as needed (for RLS). 05/13/22   Tonia Ghent, MD  sennosides-docusate sodium (SENOKOT-S) 8.6-50 MG tablet Take 1-2 tablets by mouth daily as needed for constipation. Patient not taking: Reported on 11/04/2022 08/07/17   Tonia Ghent, MD  Tetrahydroz-Dextran-PEG-Povid [facility] AD[facility]ANCED RELIEF OP) Apply 1-2 drops to eye as needed (For eye irritation.).     [provider]    Family History Family History  Problem Relation Age of Onset   Cancer Father 31       prostate Stage 4   Prostate cancer Father    Cancer Maternal Grandmother        breast   Cancer Mother     Cancer Paternal Grandmother        Colon   Colon cancer Paternal Grandmother     Social History Social History   Tobacco Use   Smoking status: Never    Passive exposure: Never   Smokeless tobacco: Never  Vaping Use   Vaping Use: Never used  Substance Use Topics   Alcohol use: Yes    Alcohol/week: 0.3 standard drinks of alcohol    Comment: rarely   Drug use: No     Allergies   Septra [sulfamethoxazole-trimethoprim]   Review of Systems Review of Systems  Respiratory:  Positive for shortness of breath.      Physical Exam Triage Vital Signs ED Triage Vitals  Enc Vitals Group     BP --      Pulse --      Resp --      Temp --      Temp src --      SpO2 --      Weight 11/04/22 1531 249 lb (112.9 kg)     Height 11/04/22 1531 6\' 1"  (1.854 m)     Head Circumference --      Peak Flow --      Pain Score 11/04/22 1530 3     Pain Loc --      Pain Edu? --      Excl. in [place]? --    No data found.  Updated Vital Signs Ht 6\' 1"  (1.854 m)   Wt 249 lb (112.9 kg)   BMI 32.85 kg/m   Visual Acuity Right Eye Distance:   Left Eye Distance:   Bilateral Distance:    Right Eye Near:   Left Eye Near:    Bilateral Near:     Physical Exam Vitals reviewed.  Constitutional:      Appearance: He is well-developed.  Pulmonary:     Breath sounds: No decreased breath sounds, wheezing, rhonchi or rales.  Chest:     Chest wall: There is no dullness to percussion.  Skin:    General: Skin is warm and dry.  Neurological:     General: No focal deficit present.     Mental Status: He is alert.  Psychiatric:        Mood and Affect: Mood normal.        Behavior: Behavior normal.      UC Treatments / Results  Labs (all labs ordered are  listed, but only abnormal results are displayed) Labs Reviewed - No data to display  EKG   Radiology No results found.  Procedures Procedures (including critical care time)  Medications Ordered in UC Medications - No data to  display  Initial Impression / Assessment and Plan / UC Course  I have reviewed the triage vital signs and the nursing notes.  Pertinent labs & imaging results that were available during my care of the patient were reviewed by me and considered in my medical decision making (see chart for details).   Patient is afebrile here without recent antipyretics. Satting well on room air. Overall is well appearing, well hydrated, without respiratory distress. Pulmonary exam is unremarkable.  Lungs CTAB without wheezing, rhonchi, rales.  No adventitious lung sounds on auscultation and low likelihood for pneumonia.  Patient does not feel elevate his arms adequately to take chest x-ray.  Recommended he follow-up with his PCP if his concerns continue or proceed to ED for further evaluation.  Final Clinical Impressions(s) / UC Diagnoses   Final diagnoses:  None   Discharge Instructions   None    ED Prescriptions   None    PDMP not reviewed this encounter.   Rose Phi, [place] 11/04/22 1606

## 2022-11-05 ENCOUNTER — Emergency Department: Payer: 59

## 2022-11-05 ENCOUNTER — Other Ambulatory Visit: Payer: Self-pay

## 2022-11-05 ENCOUNTER — Encounter: Payer: Self-pay | Admitting: Radiology

## 2022-11-05 ENCOUNTER — Emergency Department
Admission: EM | Admit: 2022-11-05 | Discharge: 2022-11-05 | Disposition: A | Discharge status: Home / Self Care | Payer: 59 | Attending: Student in an Organized Health Care Education/Training Program | Admitting: Student in an Organized Health Care Education/Training Program

## 2022-11-05 DIAGNOSIS — R0602 Shortness of breath: Secondary | ICD-10-CM | POA: Diagnosis not present

## 2022-11-05 DIAGNOSIS — J90 Pleural effusion, not elsewhere classified: Secondary | ICD-10-CM | POA: Insufficient documentation

## 2022-11-05 LAB — CBC
HCT: 40.3 % (ref 39.0–52.0)
Hemoglobin: 13.8 g/dL (ref 13.0–17.0)
MCH: 28.8 pg (ref 26.0–34.0)
MCHC: 34.2 g/dL (ref 30.0–36.0)
MCV: 84.1 fL (ref 80.0–100.0)
Platelets: 188 10*3/uL (ref 150–400)
RBC: 4.79 MIL/uL (ref 4.22–5.81)
RDW: 12.8 % (ref 11.5–15.5)
WBC: 7.5 10*3/uL (ref 4.0–10.5)
nRBC: 0 % (ref 0.0–0.2)

## 2022-11-05 LAB — COMPREHENSIVE METABOLIC PANEL
ALT: 18 U/L (ref 0–44)
AST: 21 U/L (ref 15–41)
Albumin: 4 g/dL (ref 3.5–5.0)
Alkaline Phosphatase: 78 U/L (ref 38–126)
Anion gap: 14 (ref 5–15)
BUN: 18 mg/dL (ref 6–20)
CO2: 27 mmol/L (ref 22–32)
Calcium: 9.1 mg/dL (ref 8.9–10.3)
Chloride: 99 mmol/L (ref 98–111)
Creatinine, Ser: 0.73 mg/dL (ref 0.61–1.24)
GFR, Estimated: >60 mL/min (ref >60)
Glucose, Bld: 102 mg/dL — ABNORMAL HIGH (ref 70–99)
Potassium: 3.7 mmol/L (ref 3.5–5.1)
Sodium: 140 mmol/L (ref 135–145)
Total Bilirubin: 0.8 mg/dL (ref 0.3–1.2)
Total Protein: 6.8 g/dL (ref 6.5–8.1)

## 2022-11-05 MED ORDER — IOHEXOL 350 MG/ML SOLN
100.0000 mL | Freq: Once | INTRAVENOUS | Status: AC | PRN
  Administered 2022-11-05: 75 mL via INTRAVENOUS

## 2022-11-05 NOTE — Telephone Encounter (Signed)
I spoke with pt; pt said he still feels bad today; pt did go to UC but pt said due to shoulder restrictions could not get xray. Pt said feels restrictive in chest when breathing. No CP and cannot describe as tightness in chest but "restrictive" is word used. Pt said 11/04/22 pt was out doing more than usual helping his brother and pt was SOB and nauseated. Pt did not vomit. Pt had lt shoulder surgery with nerve block on 10/31/22.pt said it feels like his diaphragm is not working right; pt does not feel like can draw air in right and cannot get deep breath. Pt does not have way to ck BP or pulse ox. I spoke with Dr Damita Dunnings and with recent surgery if seen at [facility] would do D Dimer test which due to recent surgery would probably be + and then pt would need scanning. Concerned of PE and pt voiced understanding and will go soon to [facility] ED. Pt appreciative of Dr Josefine Class input and the call. Sending note to Dr Damita Dunnings and Damita Dunnings pool.

## 2022-11-05 NOTE — Telephone Encounter (Signed)
[place] [facility] - Client TELEPHONE ADVICE RECORD [facility] Patient Name: Patrick Oconnor Gender: Male DOB: [birth year] Age: 57 Y 3 M 24 D Return Phone Number: [phone] (Primary) Address: City/ State/ Zip: [place]  [address] Client [place] [facility] - Client Client Site [place] - Day Provider Elsie Stain "Brigitte Pulse MD Contact Type Call Who Is Calling Patient / Member / Family / Caregiver Call Type Triage / Clinical Relationship To Patient Self Return Phone Number [phone] (Primary) Chief Complaint BREATHING - shortness of breath or sounds breathless Reason for Call Symptomatic / Request for [name] states that he just had shoulder surgery. Caller states he is experiencing shortness of breath and wanted to know if it could be pertaining to the surgery he just had. Translation No Nurse Assessment Nurse: Patsey Berthold, RN, Roma Kayser Date/Time Eilene Ghazi Time): 11/04/2022 5:05:10 PM Confirm and document reason for call. If symptomatic, describe symptoms. ---Caller states that he just had shoulder surgery. Caller states he is experiencing shortness of breath and wanted to know if it could be pertaining to the surgery he just had. Had surgery on Thursday- Friday and Saturday had a light restricted feeling to breathing. No pain, just has discomfort from not being able to breath fully, felt some restriction. He went to urgent care yesterday and they could see that he was not breathing fully, they could not do x-rays in office. Read that a shoulder nerve block could affect his diaphragm and breathing. Has relayed information to surgeon, but has not heard back. Does the patient have any new or worsening symptoms? ---Yes Will a triage be completed? ---Yes Related visit to physician within the last 2 weeks? ---No Does the PT have any chronic conditions?  (i.e. diabetes, asthma, this includes High risk factors for pregnancy, etc.) ---No Is this a behavioral health or substance abuse call? ---No PLEASE NOTE: All timestamps contained within this report are represented as [place] Standard Time. CONFIDENTIALTY NOTICE: This fax transmission is intended only for the addressee. It contains information that is legally privileged, confidential or otherwise protected from use or disclosure. If you are not the intended recipient, you are strictly prohibited from reviewing, disclosing, copying using or disseminating any of this information or taking any action in reliance on or regarding this information. If you have received this fax in error, please notify us immediately by telephone so that we can arrange for its return to [place]. Phone: [phone], Toll-Free: [phone], Fax: [phone] Page: 2 of 2 Call Id: [id number] Guidelines Guideline Title Affirmed Question Affirmed Notes Nurse Date/Time Eilene Ghazi Time) Breathing Difficulty Major surgery in the past month Vivi Ferns 11/04/2022 5:08:58 PM Disp. Time Eilene Ghazi Time) Disposition Final User 11/04/2022 5:03:56 PM Send to Urgent [place] Lighter 11/04/2022 5:20:54 PM Go to ED Now Yes Patsey Berthold, RN, Roma Kayser Final Disposition 11/04/2022 5:20:54 PM Go to ED Now Yes Patsey Berthold, RN, Patrick Oconnor Disagree/Comply Disagree Caller Understands Yes PreDisposition Call Doctor Care Advice Given Per Guideline GO TO ED NOW: * You need to be seen in the Emergency Department. CALL EMS 911 IF: * Call EMS if you become worse. CARE ADVICE given per Breathing Difficulty (Adult) guideline. Comments User: Patrick Eves, RN Date/Time Eilene Ghazi Time): 11/04/2022 5:20:53 PM Caller does not think his SOB is severe enough to be seen or evaluated ED and he would like to avoid another hospitalization if possible. Caller clarifies she feels the slightest shortness of breath, and feels he cant  take a deep or  well enough breath, especially with activity. Unable to have x-ray done in urgent care, due to not being able to mobilize shoulder up and out of the view of x-ray machine. User: Patrick Eves, RN Date/Time Eilene Ghazi Time): 11/04/2022 5:23:37 PM Caller would appreciate if note left for PCP, he will also attempt to reach out to surgeons office to ask if procedure/ lingering effects of anesthesia or nerve block can affect his breathing. Referrals GO TO FACILITY REFUSE

## 2022-11-05 NOTE — Telephone Encounter (Signed)
Noted. Thanks.

## 2022-11-05 NOTE — ED Triage Notes (Signed)
Pt here with SOB since Thurs after his rotator cuff surgery. Pt denies pain but states he is having trouble catching his breath.

## 2022-11-05 NOTE — ED Provider Notes (Signed)
[facility] Provider Note    Event Date/Time   First MD Initiated Contact with Patient 11/05/22 1140     (approximate)   History   Shortness of Breath   HPI  RAYLAND GOWDA is a 57 y.o. male with history of asthma, recent rotator cuff surgery few days ago presents emergency department complaining of some shortness of breath.  Patient does not feel like he can take a full breath.  States he is actually felt this way since after the surgery.  Saw his PCP and they told him to come emergency department as he would need a CT.      Physical Exam   Triage Vital Signs: ED Triage Vitals  Enc Vitals Group     BP 11/05/22 1129 (!) 158/87     Pulse Rate 11/05/22 1129 (!) 59     Resp 11/05/22 1129 18     Temp 11/05/22 1129 97.7 F (36.5 C)     Temp Source 11/05/22 1129 Oral     SpO2 11/05/22 1129 93 %     Weight 11/05/22 1131 248 lb 14.4 oz (112.9 kg)     Height 11/05/22 1131 6\' 1"  (1.854 m)     Head Circumference --      Peak Flow --      Pain Score 11/05/22 1131 0     Pain Loc --      Pain Edu? --      Excl. in [place]? --     Most recent vital signs: Vitals:   11/05/22 1129  BP: (!) 158/87  Pulse: (!) 59  Resp: 18  Temp: 97.7 F (36.5 C)  SpO2: 93%     General: Awake, no distress.   CV:  Good peripheral perfusion. regular rate and  rhythm Resp:  Normal effort. Lungs cta Abd:  No distention.   Other:      ED Results / Procedures / Treatments   Labs (all labs ordered are listed, but only abnormal results are displayed) Labs Reviewed  COMPREHENSIVE METABOLIC PANEL - Abnormal; Notable for the following components:      Result Value   Glucose, Bld 102 (*)    All other components within normal limits  CBC     EKG     RADIOLOGY CTA for PE    PROCEDURES:   Procedures   MEDICATIONS ORDERED IN ED: Medications  iohexol (OMNIPAQUE) 350 MG/ML injection 100 mL (75 mLs Intravenous Contrast Given 11/05/22 1346)      IMPRESSION / MDM / ASSESSMENT AND PLAN / ED COURSE  I reviewed the triage vital signs and the nursing notes.                              Differential diagnosis includes, but is not limited to, PE, CAP, diaphragm injury, asthma  Patient's presentation is most consistent with acute presentation with potential threat to life or bodily function.   Labs and imaging ordered  CTA for PE ordered due to patient's recent surgery.   I did independently review the radiologist report for the CTA for PE, I interpret this as being negative for PE, positive for an effusion.  I did explain these findings to the patient.  The effusion is very small and should dissipate on its own.  He is to follow-up with his regular doctor if not improving in 2 to 3 days.  Return emergency department if worsening.  Patient  is in agreement treatment plan.  I do not feel that he needs to be admitted.  He was discharged in stable condition.   FINAL CLINICAL IMPRESSION(S) / ED DIAGNOSES   Final diagnoses:  Pleural effusion     Rx / DC Orders   ED Discharge Orders     None        Note:  This document was prepared using Dragon voice recognition software and may include unintentional dictation errors.    Versie Starks, PA-C 11/05/22 1441    Merlyn Lot, MD 11/05/22 [phone]

## 2022-11-05 NOTE — Discharge Instructions (Signed)
Follow-up with your regular doctor if not improving in 2 to 3 days.  Return if worsening.  The effusion should dissipate on its own.  However you do need a repeat chest x-ray in 2 to 3 weeks.  If you become worse you need to return the emergency department

## 2022-11-06 ENCOUNTER — Telehealth: Payer: Self-pay | Admitting: *Deleted

## 2022-11-06 NOTE — Transitions of Care (Post Inpatient/ED Visit) (Signed)
   11/06/2022  Name: Patrick Oconnor MRN: [id number] DOB: [birth year]  Today's TOC FU Call Status: Today's TOC FU Call Status:: Successful TOC FU Call Competed TOC FU Call Complete Date: 11/06/22  Transition Care Management Follow-up Telephone Call Date of Discharge: 11/05/22 Discharge Facility: [facility] [place]) Type of Discharge: Emergency Department Reason for ED Visit: Respiratory (SOB) How have you been since you were released from the hospital?: Same Any questions or concerns?: No  Items Reviewed: Did you receive and understand the discharge instructions provided?: Yes Medications obtained and verified?: Yes (Medications Reviewed) Any new allergies since your discharge?: No Dietary orders reviewed?: NA Do you have support at home?: Yes People in Home: friend(s)  Home Care and Equipment/Supplies: [place] Ordered?: NA Any new equipment or medical supplies ordered?: NA  Functional Questionnaire: Do you need assistance with bathing/showering or dressing?: No Do you need assistance with meal preparation?: No Do you need assistance with eating?: No Do you have difficulty maintaining continence: No Do you need assistance with getting out of bed/getting out of a chair/moving?: No Do you have difficulty managing or taking your medications?: No  Follow up appointments reviewed: PCP Follow-up appointment confirmed?: Yes Date of PCP follow-up appointment?: 11/12/22 Follow-up Provider: Dr. Damita Dunnings [facility] Follow-up appointment confirmed?: NA Do you need transportation to your follow-up appointment?: No Do you understand care options if your condition(s) worsen?: Yes-patient verbalized understanding    SIGNATURE Emelia Salisbury, LPN

## 2022-11-07 DIAGNOSIS — M25512 Pain in left shoulder: Secondary | ICD-10-CM | POA: Diagnosis not present

## 2022-11-07 DIAGNOSIS — M75122 Complete rotator cuff tear or rupture of left shoulder, not specified as traumatic: Secondary | ICD-10-CM | POA: Diagnosis not present

## 2022-11-12 ENCOUNTER — Encounter: Payer: Self-pay | Admitting: Family Medicine

## 2022-11-12 ENCOUNTER — Ambulatory Visit: Payer: 59 | Admitting: Family Medicine

## 2022-11-12 VITALS — BP 138/80 | HR 63 | Temp 98.1°F | Ht 73.0 in | Wt 253.0 lb

## 2022-11-12 DIAGNOSIS — R42 Dizziness and giddiness: Secondary | ICD-10-CM | POA: Diagnosis not present

## 2022-11-12 DIAGNOSIS — G2581 Restless legs syndrome: Secondary | ICD-10-CM | POA: Diagnosis not present

## 2022-11-12 DIAGNOSIS — R9389 Abnormal findings on diagnostic imaging of other specified body structures: Secondary | ICD-10-CM

## 2022-11-12 DIAGNOSIS — G4730 Sleep apnea, unspecified: Secondary | ICD-10-CM

## 2022-11-12 DIAGNOSIS — Z4789 Encounter for other orthopedic aftercare: Secondary | ICD-10-CM | POA: Insufficient documentation

## 2022-11-12 MED ORDER — ROPINIROLE HCL 1 MG PO TABS: 3.0000 mg | ORAL_TABLET | Freq: Every day | ORAL | Status: DC | PRN

## 2022-11-12 MED ORDER — MESALAMINE ER 0.375 G PO CP24: 0.7500 g | ORAL_CAPSULE | Freq: Every morning | ORAL | Status: DC

## 2022-11-12 NOTE — Patient Instructions (Addendum)
Recheck CXR in about 1 month.  [facility]  [address], [place], [place] [address] Phone: [phone] Let me know if you have trouble getting scheduled.    Try to regulate your schedule in the meantime.  Up to 3mg  requip in the meantime.   Let me know if you don't get a call from neurology.  Take care.  Glad to see you.

## 2022-11-12 NOTE — Progress Notes (Signed)
He fell skiing and got injured.  Had L shoulder surgery.  ER d/w pt.  No dyspnea in the meantime- it resolved.  He feels better in the meantime and discussed getting follow up xray in about 1 month.  Previous imaging report reviewed with patient.  IMPRESSION: Significant breathing motion throughout the examination. This limits evaluation for pulmonary emboli. Nondiagnostic for small and peripheral emboli. No segmental or larger embolus identified.   Parenchymal opacity left lower lobe bandlike contours. Atelectasis versus infiltrate. Tiny left effusion. Recommend follow-up to confirm clearance.  Discussed that his restless leg symptoms are clearly worse in the meantime.  Sleep schedule is disrupted with surgery, injury, etc.  He has tried up to 3mg  Requip a day, with max 2mg  at a time.  He failed lower doses in the past.  He was going to try an extra iron supplement in the meantime.  He has been taking iron at baseline.  Discussed sleep apnea.  He wanted to know about possible inspire eval.  Still using CPAP.  We talked about referral to Dr. Vickey Huger in the meantime.  Meds, vitals, and allergies reviewed.   ROS: Per HPI unless specifically indicated in ROS section   Nad Ncat Neck supple, no LA Rrr Ctab Abd soft, not ttp Extremities well-perfused. Left arm in sling.  30 minutes were devoted to patient care in this encounter (this includes time spent reviewing the patient's file/history, interviewing and examining the patient, counseling/reviewing plan with patient).

## 2022-11-13 DIAGNOSIS — R9389 Abnormal findings on diagnostic imaging of other specified body structures: Secondary | ICD-10-CM | POA: Insufficient documentation

## 2022-11-13 DIAGNOSIS — M25512 Pain in left shoulder: Secondary | ICD-10-CM | POA: Diagnosis not present

## 2022-11-13 DIAGNOSIS — M75122 Complete rotator cuff tear or rupture of left shoulder, not specified as traumatic: Secondary | ICD-10-CM | POA: Diagnosis not present

## 2022-11-13 NOTE — Assessment & Plan Note (Signed)
Continue Requip for now but will need neurology input.  Discussed with patient about trying to regulate his sleep cycle is much as possible in the meantime to try to limit his symptoms.  Continue iron for now.

## 2022-11-13 NOTE — Assessment & Plan Note (Signed)
Fortunately no PE seen.  Respiratory symptoms improved in the meantime.  Lungs are clear. Recheck CXR in about 1 month.  [facility] [place]  He can let me know if he has trouble getting scheduled.

## 2022-11-13 NOTE — Assessment & Plan Note (Signed)
Refer to neurology.  He was asking about candidacy for inspire device.  I need neurology input.  Appreciate help of all involved.

## 2022-11-15 DIAGNOSIS — M25512 Pain in left shoulder: Secondary | ICD-10-CM | POA: Diagnosis not present

## 2022-11-15 DIAGNOSIS — M75122 Complete rotator cuff tear or rupture of left shoulder, not specified as traumatic: Secondary | ICD-10-CM | POA: Diagnosis not present

## 2022-11-19 DIAGNOSIS — M25512 Pain in left shoulder: Secondary | ICD-10-CM | POA: Diagnosis not present

## 2022-11-19 DIAGNOSIS — M75122 Complete rotator cuff tear or rupture of left shoulder, not specified as traumatic: Secondary | ICD-10-CM | POA: Diagnosis not present

## 2022-11-21 ENCOUNTER — Encounter: Payer: Self-pay | Admitting: Family Medicine

## 2022-11-22 DIAGNOSIS — M75122 Complete rotator cuff tear or rupture of left shoulder, not specified as traumatic: Secondary | ICD-10-CM | POA: Diagnosis not present

## 2022-11-22 DIAGNOSIS — M25512 Pain in left shoulder: Secondary | ICD-10-CM | POA: Diagnosis not present

## 2022-11-25 DIAGNOSIS — M25512 Pain in left shoulder: Secondary | ICD-10-CM | POA: Diagnosis not present

## 2022-11-25 DIAGNOSIS — M75122 Complete rotator cuff tear or rupture of left shoulder, not specified as traumatic: Secondary | ICD-10-CM | POA: Diagnosis not present

## 2022-11-28 DIAGNOSIS — M25512 Pain in left shoulder: Secondary | ICD-10-CM | POA: Diagnosis not present

## 2022-11-28 DIAGNOSIS — M75122 Complete rotator cuff tear or rupture of left shoulder, not specified as traumatic: Secondary | ICD-10-CM | POA: Diagnosis not present

## 2022-12-02 DIAGNOSIS — M25512 Pain in left shoulder: Secondary | ICD-10-CM | POA: Diagnosis not present

## 2022-12-02 DIAGNOSIS — M75122 Complete rotator cuff tear or rupture of left shoulder, not specified as traumatic: Secondary | ICD-10-CM | POA: Diagnosis not present

## 2022-12-05 DIAGNOSIS — M25512 Pain in left shoulder: Secondary | ICD-10-CM | POA: Diagnosis not present

## 2022-12-05 DIAGNOSIS — M75122 Complete rotator cuff tear or rupture of left shoulder, not specified as traumatic: Secondary | ICD-10-CM | POA: Diagnosis not present

## 2022-12-10 DIAGNOSIS — M75122 Complete rotator cuff tear or rupture of left shoulder, not specified as traumatic: Secondary | ICD-10-CM | POA: Diagnosis not present

## 2022-12-10 DIAGNOSIS — M25512 Pain in left shoulder: Secondary | ICD-10-CM | POA: Diagnosis not present

## 2023-01-08 ENCOUNTER — Other Ambulatory Visit: Payer: Self-pay | Admitting: Family Medicine

## 2023-01-08 ENCOUNTER — Other Ambulatory Visit: Payer: Self-pay

## 2023-01-08 ENCOUNTER — Other Ambulatory Visit (HOSPITAL_COMMUNITY): Payer: Self-pay

## 2023-01-08 DIAGNOSIS — M25512 Pain in left shoulder: Secondary | ICD-10-CM | POA: Diagnosis not present

## 2023-01-08 DIAGNOSIS — M75122 Complete rotator cuff tear or rupture of left shoulder, not specified as traumatic: Secondary | ICD-10-CM | POA: Diagnosis not present

## 2023-01-08 DIAGNOSIS — R42 Dizziness and giddiness: Secondary | ICD-10-CM

## 2023-01-08 DIAGNOSIS — G2581 Restless legs syndrome: Secondary | ICD-10-CM

## 2023-01-08 MED ORDER — ROPINIROLE HCL 1 MG PO TABS
1.0000 mg | ORAL_TABLET | Freq: Every day | ORAL | 3 refills | Status: DC | PRN
  Filled 2023-01-08: qty 180, 90d supply, fill #0
  Filled 2023-04-15: qty 180, 90d supply, fill #1
  Filled 2023-04-15: qty 180, 90d supply, fill #0
  Filled 2023-04-15: qty 180, 90d supply, fill #1

## 2023-01-13 DIAGNOSIS — M25512 Pain in left shoulder: Secondary | ICD-10-CM | POA: Diagnosis not present

## 2023-01-13 DIAGNOSIS — M75122 Complete rotator cuff tear or rupture of left shoulder, not specified as traumatic: Secondary | ICD-10-CM | POA: Diagnosis not present

## 2023-01-16 DIAGNOSIS — M75122 Complete rotator cuff tear or rupture of left shoulder, not specified as traumatic: Secondary | ICD-10-CM | POA: Diagnosis not present

## 2023-01-16 DIAGNOSIS — M25512 Pain in left shoulder: Secondary | ICD-10-CM | POA: Diagnosis not present

## 2023-01-20 ENCOUNTER — Ambulatory Visit (INDEPENDENT_AMBULATORY_CARE_PROVIDER_SITE_OTHER): Payer: 59 | Admitting: Neurology

## 2023-01-20 ENCOUNTER — Encounter: Payer: Self-pay | Admitting: Neurology

## 2023-01-20 ENCOUNTER — Other Ambulatory Visit: Payer: Self-pay

## 2023-01-20 VITALS — BP 147/93 | HR 62 | Ht 74.0 in | Wt 245.0 lb

## 2023-01-20 DIAGNOSIS — G2581 Restless legs syndrome: Secondary | ICD-10-CM | POA: Diagnosis not present

## 2023-01-20 DIAGNOSIS — G4733 Obstructive sleep apnea (adult) (pediatric): Secondary | ICD-10-CM | POA: Diagnosis not present

## 2023-01-20 DIAGNOSIS — M79609 Pain in unspecified limb: Secondary | ICD-10-CM | POA: Diagnosis not present

## 2023-01-20 DIAGNOSIS — R202 Paresthesia of skin: Secondary | ICD-10-CM

## 2023-01-20 DIAGNOSIS — G4734 Idiopathic sleep related nonobstructive alveolar hypoventilation: Secondary | ICD-10-CM

## 2023-01-20 DIAGNOSIS — Z9989 Dependence on other enabling machines and devices: Secondary | ICD-10-CM | POA: Insufficient documentation

## 2023-01-20 NOTE — Addendum Note (Signed)
Addended by: Melvyn Novas on: 01/20/2023 02:29 PM   Modules accepted: Orders

## 2023-01-20 NOTE — Progress Notes (Signed)
SLEEP MEDICINE CLINIC    Provider:  Melvyn Novas, MD  Primary Care Physician:  Joaquim Nam, MD [address] 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  Chief concern according to patient :  " I have RLS and I use a CPAP( not brought to this consultation )   and I am a bit tired of logging it around-  [facility] would be of  interest" ,  I have the pleasure of seeing Patrick Oconnor 01/20/23 a right-handed male with RLS and OSA sleep disorder.   The patient had the sleep study at [facility] under Dr Dickie La care. He already takes dopamine agonists and oral iron,  The study was performed  10-15-2018 by [facility] HST-the result was a total recording time of 592 minutes, amount ofdate of today,  Obstructive apneas and hypopneas and some central as well as some mixed apneas altogether at an AHI of 38.1/h.  The patient slept mostly in supine which she seems to prefer but there were 87 minutes recorded in left lateral position.  The left lateral position did not help the AHI and contrary it was actually higher than in supine position.  The patient had 76.3 minutes of oxygen desaturation in total defined as under 90% saturation.  This is 13% of the total recorded time and would be considered significant.  The lowest oxygen nadir was 80%.  There was snoring noted heart rate varied between 46 and 177 bpm.  This study cannot detect a difference between PLM's or restless legs as a home sleep test does not record these.  So the diagnosis was severe obstructive sleep apnea with an AHI of 38.  I would add  that there is also moderate severe hypoxia associated.  There is also intermittent tachycardia noted.    The recommendation of Dr. Nicholos Johns on 10-28-2018 was to start AUTO_ CPAP with the manufacturing range of 5 through 20 cm water pressure.  The device is not specified. Nasal pillow was not specified but is used.  I would add 3 cm water expiratory pressure relief and humidifier and interface of patient's choice.  The machine is just 59 years old now so next year it would be replaced but the data of hypoxia are concerning in regards to treatment by inspire.  I also do not see a differential between REM and non-REM sleep AHI which is important before we can make such a decision.     Sleep relevant medical history: Sleep choking, snorer, RLS,  mucous plug before CPAP , Nocturia before CPAP. Nasal septal deviation repaired,still some sinus problems, seasonal allergies.     Family medical /sleep history: Mother , Brother with OSA.    Social history:  Patient is working as a PT at [employer] and lives in a household alone. Family status is single , without children,one dog.  The patient currently works in daytime Tobacco use; none . ETOH use : rare ,  Caffeine intake  in form of Coffee( /) Soda(  some days) Tea ( when eating out) and rarely 5 h energy drinks Exercise in form of walking.   Hobbies : traveling.       Sleep habits are as follows: The patient's dinner time is between 7-8 PM. The patient goes to bed at 12 PM and continues to sleep for 7 hours, wakes for 0-2 bathroom breaks, the first time at 3 AM.   The preferred sleep position is supine , with the support of 1-2 pillows.  Dreams are reportedly infrequent.   The patient wakes up with an alarm. 7.30  AM is the usual rise time. He reports not feeling refreshed or restored in AM, with symptoms such as dry mouth. Naps are taken infrequently, because of RLS keeping him from sleeping. lasting from 30 to 45 minutes and are less refreshing  than nocturnal sleep.    Review of Systems: Out of a complete 14 system review, the patient complains of only the following symptoms, and all other reviewed systems are negative.:  Fatigue, sleepiness , snoring, fragmented sleep, RLS   How likely are you to doze in the following situations: 0 = not likely, 1 = slight chance, 2 = moderate chance, 3 = high chance   Sitting and Reading? Watching Television? Sitting inactive in a public place (theater or meeting)? As a passenger in a car for an hour without a break? Lying down in the afternoon when circumstances permit? Sitting and talking to someone? Sitting quietly after lunch without alcohol? In a car, while stopped for a few minutes in traffic?    RLS , since age 49 Total = 4/ 24 points   FSS endorsed at 42/ 63 points.   Social History   Socioeconomic History   Marital status: Single    Spouse name: Not on file   Number of children: Not on file   Years of education: 72   Highest education level: Not on file  Occupational History   Occupation: physical therapist    Employer: [employer]  Tobacco Use   Smoking status: Never    Passive exposure: Never   Smokeless tobacco: Never  Vaping Use   Vaping Use: Never used  Substance and Sexual Activity   Alcohol use: Yes    Alcohol/week: 0.3 standard drinks of alcohol    Comment: rarely   Drug use: No   Sexual activity: Not Currently  Other Topics Concern   Not on file  Social History Narrative   Native of [place], [place]   PT at [employer]   single   Social Determinants of Health   Financial Resource Strain: Not on file  Food Insecurity: Not on file  Transportation Needs: Not on file  Physical Activity: Not on file  Stress: Not on file  Social Connections: Not on file    Family History  Problem Relation Age of Onset   Cancer Father 54       prostate Stage 4   Prostate cancer Father    Cancer Maternal Grandmother        breast   Cancer Mother    Cancer Paternal  Grandmother        Colon   Colon cancer Paternal Grandmother     Past Medical History:  Diagnosis Date   Anemia    Asthma, exercise induced    Degenerative disc disease, lumbar    Depression    Environmental allergies    GERD (gastroesophageal reflux disease)    History  of bronchitis    Hypertension    IBS (irritable bowel syndrome)    Pneumonia    hx of   PONV (postoperative nausea and vomiting)    Sleep apnea 04/2007   Started CPAP; wears CPAP nightly   Tinnitus aurium, left    Ulcerative colitis [facility])     Past Surgical History:  Procedure Laterality Date   ACROMIO-CLAVICULAR JOINT REPAIR Right 04/15/2013   Procedure: RIGHT SHOULDER ACROMIO-CLAVICULAR RECONSTRUCTION WITH ALLOGRAFT   ;  Surgeon: Senaida Lange, MD;  Location: [facility] OR;  Service: Orthopedics;  Laterality: Right;   ACROMIO-CLAVICULAR JOINT REPAIR Right 06/03/2013   Procedure: ACROMIO-CLAVICULAR JOINT REPAIR;  Surgeon: Senaida Lange, MD;  Location: [facility] OR;  Service: Orthopedics;  Laterality: Right;   ACROMIO-CLAVICULAR JOINT REPAIR Right 10/19/2015   Procedure: OPEN RIGHT SHOULDER ACROMIO-CLAVICULAR JOINT RECONSTRUCTION ;  Surgeon: Francena Hanly, MD;  Location: [facility] OR;  Service: Orthopedics;  Laterality: Right;   COLONOSCOPY  08/12/2005   "multiple"   CYST EXCISION  08/03/2022   Left perineal area   ESOPHAGOGASTRODUODENOSCOPY  10/11/2007   normal; multiple   ESOPHAGOGASTRODUODENOSCOPY (EGD) WITH PROPOFOL N/A 01/22/2016   Procedure: ESOPHAGOGASTRODUODENOSCOPY (EGD) WITH PROPOFOL;  Surgeon: Carman Ching, MD;  Location: [facility];  Service: Endoscopy;  Laterality: N/A;   INCISION AND DRAINAGE Right 06/03/2013   Procedure: INCISION AND DRAINAGE RIGHT SHOULDER;  Surgeon: Senaida Lange, MD;  Location: [facility] OR;  Service: Orthopedics;  Laterality: Right;   INCISION AND DRAINAGE ABSCESS N/A 07/25/2022   Procedure: INCISION AND DRAINAGE OF SCROTAL ABSCESS;  Surgeon: Riki Altes, MD;  Location: [facility] ORS;  Service:  Urology;  Laterality: N/A;   pilonidal cystectomy  42003   x 2   PYLOROPLASTY  infant   pyloric stenosis   SEPTOPLASTY     SHOULDER ARTHROSCOPY WITH ROTATOR CUFF REPAIR Left 10/31/2022   Procedure: Left shoulder arthroscopy, debridement, subacromial decompression, distal clavicle resection, rotator cuff repair;  Surgeon: Francena Hanly, MD;  Location: [facility];  Service: Orthopedics;  Laterality: Left;    SHOULDER OPEN ROTATOR CUFF REPAIR Right 10/19/2015   Procedure: OPEN RIGHT SHOULDER ROTATOR CUFF REPAIR ;  Surgeon: Francena Hanly, MD;  Location: [facility] OR;  Service: Orthopedics;  Laterality: Right;   thrombosed external hemorrhoid  04/13/2007   incised   TONSILLECTOMY AND ADENOIDECTOMY       Current Outpatient Medications on File Prior to Visit  Medication Sig Dispense Refill   acetaminophen (TYLENOL) 500 MG tablet Take 1,000 mg by mouth every 6 (six) hours as needed for moderate pain.     albuterol (VENTOLIN HFA) 108 (90 Base) MCG/ACT inhaler Inhale 1-2 puffs into the lungs every 6 (six) hours as needed for wheezing (prior to exercise.). 8.5 g 2   bisacodyl (DULCOLAX) 5 MG EC tablet 5 mg daily as needed for moderate constipation.     Cholecalciferol (VITAMIN D) 125 MCG (5000 UT) CAPS Take 5,000 Units by mouth daily.     citalopram (CELEXA) 20 MG tablet Take 1 tablet (20 mg total) by mouth daily. 90 tablet 3   ferrous sulfate 325 (65 FE) MG tablet Take 1 tablet (325 mg total) by mouth daily.     fluticasone (FLONASE) 50 MCG/ACT nasal spray Place 1 spray into both nostrils daily. 16 g 0   glucosamine-chondroitin 500-400 MG tablet Take 2 tablets by mouth in the morning.     ibuprofen (ADVIL) 800 MG tablet Take 1 tablet (800 mg total) by mouth 3 (three) times daily. 90 tablet  1   loratadine (CLARITIN) 10 MG tablet Take 10 mg by mouth daily.     mesalamine (LIALDA) 1.2 g EC tablet Take 2.4 g by mouth 3 (three) times daily.     polyethylene glycol powder (GLYCOLAX/MIRALAX) powder Take 17 g by  mouth 2 (two) times daily as needed (For constipation.).     Probiotic Product (PROBIOTIC DAILY PO) Take 1 capsule by mouth daily as needed (For digestive health.).      rOPINIRole (REQUIP) 1 MG tablet Take 3 tablets (3 mg total) by mouth daily as needed (for RLS).     rOPINIRole (REQUIP) 1 MG tablet Take 1-2 tablets (1-2 mg total) by mouth daily as needed (for RLS). 180 tablet 3   sennosides-docusate sodium (SENOKOT-S) 8.6-50 MG tablet Take 1-2 tablets by mouth daily as needed for constipation.     Tetrahydroz-Dextran-PEG-Povid (VISINE ADVANCED RELIEF OP) Apply 1-2 drops to eye as needed (For eye irritation.).      colchicine 0.6 MG tablet Take 1 tablet (0.6 mg total) by mouth daily as needed. Okay to fill with mitigare, colchicine, colcrys.  Take with food. (Patient not taking: Reported on 01/20/2023) 30 tablet 0   cyclobenzaprine (FLEXERIL) 10 MG tablet Take 1 tablet (10 mg total) by mouth 3 (three) times daily as needed for muscle spasms. (Patient not taking: Reported on 01/20/2023) 30 tablet 1   mesalamine (APRISO) 0.375 g 24 hr capsule Take 2 capsules (0.75 g total) by mouth every morning. (Patient not taking: Reported on 01/20/2023)     ondansetron (ZOFRAN) 4 MG tablet Take 1 tablet (4 mg total) by mouth every 8 (eight) hours as needed for nausea or vomiting. (Patient not taking: Reported on 01/20/2023) 10 tablet 0   pantoprazole (PROTONIX) 20 MG tablet Take 1 tablet (20 mg total) by mouth daily. (Patient not taking: Reported on 01/20/2023) 90 tablet 3   No current facility-administered medications on file prior to visit.    Allergies  Allergen Reactions   Septra [Sulfamethoxazole-Trimethoprim] Nausea Only and Other (See Comments)    Heads and joint pain     DIAGNOSTIC DATA (LABS, IMAGING, TESTING) - I reviewed patient records, labs, notes, testing and imaging myself where available.  Lab Results  Component Value Date   WBC 7.5 11/05/2022   HGB 13.8 11/05/2022   HCT 40.3 11/05/2022    MCV 84.1 11/05/2022   PLT 188 11/05/2022      Component Value Date/Time   NA 140 11/05/2022 1211   NA 133 (L) 09/24/2013 1616   K 3.7 11/05/2022 1211   K 3.5 09/24/2013 1616   CL 99 11/05/2022 1211   CL 106 09/24/2013 1616   CO2 27 11/05/2022 1211   CO2 26 09/24/2013 1616   GLUCOSE 102 (H) 11/05/2022 1211   GLUCOSE 111 (H) 09/24/2013 1616   BUN 18 11/05/2022 1211   BUN 27 (H) 09/24/2013 1616   CREATININE 0.73 11/05/2022 1211   CREATININE 0.76 06/28/2016 1204   CALCIUM 9.1 11/05/2022 1211   CALCIUM 8.9 09/24/2013 1616   PROT 6.8 11/05/2022 1211   PROT 8.3 (H) 09/24/2013 1616   ALBUMIN 4.0 11/05/2022 1211   ALBUMIN 4.4 09/24/2013 1616   AST 21 11/05/2022 1211   AST 34 09/24/2013 1616   ALT 18 11/05/2022 1211   ALT 40 09/24/2013 1616   ALKPHOS 78 11/05/2022 1211   ALKPHOS 116 09/24/2013 1616   BILITOT 0.8 11/05/2022 1211   BILITOT 0.8 09/24/2013 1616   GFRNONAA >60 11/05/2022 1211   GFRNONAA >  89 06/28/2016 1204   GFRAA >89 06/28/2016 1204   Lab Results  Component Value Date   CHOL 148 09/12/2022   HDL 30.90 (L) 09/12/2022   LDLCALC 82 09/12/2022   LDLDIRECT 94.0 10/26/2019   TRIG 176.0 (H) 09/12/2022   CHOLHDL 5 09/12/2022   No results found for: "HGBA1C" Lab Results  Component Value Date   VITAMINB12 591 08/01/2020   Lab Results  Component Value Date   TSH 2.02 09/12/2022    PHYSICAL EXAM:  Today's Vitals   01/20/23 1303 01/20/23 1309  BP: (!) 160/91 (!) 147/93  Pulse: 68 62  Weight: 245 lb (111.1 kg)   Height: 6\' 2"  (1.88 m)    Body mass index is 31.46 kg/m.   Wt Readings from Last 3 Encounters:  01/20/23 245 lb (111.1 kg)  11/12/22 253 lb (114.8 kg)  11/05/22 248 lb 14.4 oz (112.9 kg)     Ht Readings from Last 3 Encounters:  01/20/23 6\' 2"  (1.88 m)  11/12/22 6\' 1"  (1.854 m)  11/05/22 6\' 1"  (1.854 m)      General: The patient is awake, alert and appears not in acute distress. The patient is well groomed. Head: Normocephalic, atraumatic.  Neck is supple. Mallampati 3 plus,  neck circumference:19 inches . Small oral opening,. Nasal airflow is patent.  Retrognathia is not seen.  Dental status: biological, facial hair.  Cardiovascular:  Regular rate and cardiac rhythm by pulse,  without distended neck veins. Respiratory: Lungs are clear to auscultation.  Skin:  Without evidence of ankle edema, or rash. Trunk: The patient's posture is erect.   NEUROLOGIC EXAM: The patient is awake and alert, oriented to place and time.   Memory subjective described as intact.  Attention span & concentration ability appears normal.  Speech is fluent,  without  dysarthria, dysphonia or aphasia.  Mood and affect are appropriate.   Cranial nerves: no loss of smell or taste reported  Pupils are equal and briskly reactive to light. Funduscopic exam deferred.  Extraocular movements in vertical and horizontal planes were intact and without nystagmus. No Diplopia. Visual fields by finger perimetry are intact. Hearing was intact to soft voice and finger rubbing.    Facial sensation intact to fine touch.  Facial motor strength is symmetric and tongue and uvula move midline.  Neck ROM : rotation, tilt and flexion extension were normal for age. The shoulder shrug was asymmetrical.  Left shoulder is higher.    Motor exam:  Symmetric bulk, tone and ROM.   Normal tone without cog wheeling, symmetric grip strength .   Sensory:  Fine touch, pinprick and vibration were tested  and  normal.  Proprioception tested in the upper extremities was normal.   Coordination: Rapid alternating movements in the fingers/hands were of normal speed.  The Finger-to-nose maneuver was intact without evidence of ataxia, dysmetria or tremor.   Gait and station: Patient could rise unassisted from a seated position, walked without assistive device.  Stance is of normal width/ base and the patient turned with 3 steps.  Toe and heel walk were deferred.  Deep tendon reflexes: in  the  upper and lower extremities are symmetric and brisk at lower extremities.  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  I like to evaluate for spinal stenosis, neuropathy and iron deficiency.   2) OSA was last tested in Spring 2020, and I have no access to compliance data or machine settings at this point. Will repeat a sleep study to evaluate his candidacy for [facility] ( I am not a fan ). I will order a HST for you .   PS : please bring your machine to the next visit with me, following the sleep test.   I plan to follow up either personally or through our NP within 4-6 months.   I would like to thank Joaquim Nam, MD  for allowing me to meet with and to take care of this pleasant patient.   CC: I will share my notes with PCP.  After spending a total time of  45  minutes face to face and additional time for physical and neurologic examination, review of laboratory studies,  personal review of imaging studies, reports and results of other testing and review of referral information / records as far as provided in visit,   Electronically signed by: Melvyn Novas, MD 01/20/2023 1:16 PM  [facility] Sleep Board certified by The [employer] of Sleep Medicine and Diplomate of the [employer] of Sleep Medicine. Board certified In Neurology through the ABPN, Fellow of the [employer] of Neurology. Medical Director of [employer].

## 2023-01-20 NOTE — Patient Instructions (Addendum)
Primary concern is RLS, not OSA    1) RLS/ now on 360 tab ( 90 days ) of Ropinirole, 1 mg tab- one taken at lunchtime , takes 1 or 2 tab at 6 Pm and 2 at bedtime.  I like to evaluate for spinal stenosis, neuropathy and iron deficiency.    2) OSA was last tested in Spring 2020, and I have no access to compliance data or machine settings at this point. Will repeat a sleep study to evaluate his candidacy for [facility] ( I am not a fan ). I will order a HST for you .    PS : please bring your machine to the next visit with me, following the sleep test.    I plan to follow up either personally or through our NP within 4-6 months.     Restless Legs Syndrome Restless legs syndrome is a condition that causes uncomfortable feelings or sensations in the legs, especially while sitting or lying down. The sensations usually cause an overwhelming urge to move the legs. The arms can also sometimes be affected. The condition can range from mild to severe. The symptoms often interfere with a person's ability to sleep. What are the causes? The cause of this condition is not known. What increases the risk? The following factors may make you more likely to develop this condition: Being older than 50. Pregnancy. Being a woman. In general, the condition is more common in women than in men. A family history of the condition. Having iron deficiency. Overuse of caffeine, nicotine, or alcohol. Certain medical conditions, such as kidney disease, Parkinson's disease, or nerve damage. Certain medicines, such as those for high blood pressure, nausea, colds, allergies, depression, and some heart conditions. What are the signs or symptoms? The main symptom of this condition is uncomfortable sensations in the legs, such as: Pulling. Tingling. Prickling. Throbbing. Crawling. Burning. Usually, the sensations: Affect both sides of the body. Are worse when you sit or lie down. Are worse at night. These may make it  difficult to fall asleep. Make you have a strong urge to move your legs. Are temporarily relieved by moving your legs or standing. The arms can also be affected, but this is rare. People who have this condition often have tiredness during the day because of their lack of sleep at night. How is this diagnosed? This condition may be diagnosed based on: Your symptoms. Blood tests. In some cases, you may be monitored in a sleep lab by a specialist (a sleep study). This can detect any disruptions in your sleep. How is this treated? This condition is treated by managing the symptoms. This may include: Lifestyle changes, such as exercising, using relaxation techniques, and avoiding caffeine, alcohol, or tobacco. Iron supplements. Medicines. Parkinson's medications may be tried first. Anti-seizure medications can also be helpful. Follow these instructions at home: General instructions Take over-the-counter and prescription medicines only as told by your health care provider. Use methods to help relieve the uncomfortable sensations, such as: Massaging your legs. Walking or stretching. Taking a cold or hot bath. Keep all follow-up visits. This is important. Lifestyle     Practice good sleep habits. For example, go to bed and get up at the same time every day. Most adults should get 7-9 hours of sleep each night. Exercise regularly. Try to get at least 30 minutes of exercise most days of the week. Practice ways of relaxing, such as yoga or meditation. Avoid caffeine and alcohol. Do not use any products  that contain nicotine or tobacco. These products include cigarettes, chewing tobacco, and vaping devices, such as e-cigarettes. If you need help quitting, ask your health care provider. Where to find more information [employer] of Neurological Disorders and Stroke: [employer] Contact a health care provider if: Your symptoms get worse or they do not improve with  treatment. Summary Restless legs syndrome is a condition that causes uncomfortable feelings or sensations in the legs, especially while sitting or lying down. The symptoms often interfere with your ability to sleep. This condition is treated by managing the symptoms. You may need to make lifestyle changes or take medicines. This information is not intended to replace advice given to you by your health care provider. Make sure you discuss any questions you have with your health care provider. Document Revised: 03/11/2021 Document Reviewed: 03/11/2021 Elsevier Patient Education  2024 [employer].

## 2023-01-20 NOTE — Addendum Note (Signed)
Addended byLenn Cal on: 01/20/2023 02:20 PM   Modules accepted: Orders

## 2023-01-21 ENCOUNTER — Telehealth: Payer: Self-pay | Admitting: Neurology

## 2023-01-21 DIAGNOSIS — M25512 Pain in left shoulder: Secondary | ICD-10-CM | POA: Diagnosis not present

## 2023-01-21 DIAGNOSIS — M75122 Complete rotator cuff tear or rupture of left shoulder, not specified as traumatic: Secondary | ICD-10-CM | POA: Diagnosis not present

## 2023-01-21 LAB — COMPREHENSIVE METABOLIC PANEL
ALT: 30 IU/L (ref 0–44)
AST: 29 IU/L (ref 0–40)
Albumin/Globulin Ratio: 1.8
Albumin: 4.7 g/dL (ref 3.8–4.9)
Alkaline Phosphatase: 122 IU/L — ABNORMAL HIGH (ref 44–121)
BUN/Creatinine Ratio: 16 (ref 9–20)
BUN: 14 mg/dL (ref 6–24)
Bilirubin Total: 0.4 mg/dL (ref 0.0–1.2)
CO2: 21 mmol/L (ref 20–29)
Calcium: 9.4 mg/dL (ref 8.7–10.2)
Chloride: 103 mmol/L (ref 96–106)
Creatinine, Ser: 0.9 mg/dL (ref 0.76–1.27)
Globulin, Total: 2.6 g/dL (ref 1.5–4.5)
Glucose: 95 mg/dL (ref 70–99)
Potassium: 4.3 mmol/L (ref 3.5–5.2)
Sodium: 142 mmol/L (ref 134–144)
Total Protein: 7.3 g/dL (ref 6.0–8.5)
eGFR: 98 mL/min/{1.73_m2} (ref >59)

## 2023-01-21 LAB — IRON,TIBC AND FERRITIN PANEL
Ferritin: 80 ng/mL (ref 30–400)
Iron Saturation: 20 % (ref 15–55)
Iron: 73 ug/dL (ref 38–169)
Total Iron Binding Capacity: 364 ug/dL (ref 250–450)
UIBC: 291 ug/dL (ref 111–343)

## 2023-01-21 NOTE — Telephone Encounter (Signed)
01/21/23 [facility] [place]  01/20/23 [facility] no auth req EE

## 2023-01-22 ENCOUNTER — Other Ambulatory Visit: Payer: Self-pay | Admitting: Family Medicine

## 2023-01-22 ENCOUNTER — Telehealth: Payer: Self-pay | Admitting: Neurology

## 2023-01-22 ENCOUNTER — Telehealth: Payer: Self-pay | Admitting: Anesthesiology

## 2023-01-22 DIAGNOSIS — R748 Abnormal levels of other serum enzymes: Secondary | ICD-10-CM

## 2023-01-22 NOTE — Telephone Encounter (Signed)
[facility] [employer] NPR sent to GI [phone] 

## 2023-01-22 NOTE — Telephone Encounter (Signed)
Pt was called and informed of lab results and Dr Dohmeier's recommendation. That his Alkaline phosphatase is elevated and to follow up with PCP for bone density in near future. Pt verbalized understanding. Pt had no questions at this time but was encouraged to call back if questions arise.

## 2023-01-22 NOTE — Telephone Encounter (Signed)
-----   Message from Melvyn Novas, MD sent at 01/22/2023  8:01 AM EDT ----- Alkaline phosphatase elevated , please check with PCP for bone density in the near future. Safe to have MRI or CT with contrast agents.

## 2023-01-24 ENCOUNTER — Ambulatory Visit
Admission: RE | Admit: 2023-01-24 | Discharge: 2023-01-24 | Disposition: A | Discharge status: Home / Self Care | Payer: 59 | Source: Ambulatory Visit | Attending: Neurology | Admitting: Neurology

## 2023-01-24 DIAGNOSIS — G2581 Restless legs syndrome: Secondary | ICD-10-CM

## 2023-01-24 DIAGNOSIS — G4733 Obstructive sleep apnea (adult) (pediatric): Secondary | ICD-10-CM

## 2023-01-24 DIAGNOSIS — R292 Abnormal reflex: Secondary | ICD-10-CM | POA: Diagnosis not present

## 2023-01-24 DIAGNOSIS — M75122 Complete rotator cuff tear or rupture of left shoulder, not specified as traumatic: Secondary | ICD-10-CM | POA: Diagnosis not present

## 2023-01-24 DIAGNOSIS — M48061 Spinal stenosis, lumbar region without neurogenic claudication: Secondary | ICD-10-CM | POA: Diagnosis not present

## 2023-01-24 DIAGNOSIS — M25512 Pain in left shoulder: Secondary | ICD-10-CM | POA: Diagnosis not present

## 2023-01-27 ENCOUNTER — Other Ambulatory Visit (INDEPENDENT_AMBULATORY_CARE_PROVIDER_SITE_OTHER): Payer: 59

## 2023-01-27 DIAGNOSIS — R748 Abnormal levels of other serum enzymes: Secondary | ICD-10-CM

## 2023-01-28 DIAGNOSIS — H5212 Myopia, left eye: Secondary | ICD-10-CM | POA: Diagnosis not present

## 2023-01-28 DIAGNOSIS — K5289 Other specified noninfective gastroenteritis and colitis: Secondary | ICD-10-CM | POA: Diagnosis not present

## 2023-01-28 DIAGNOSIS — K6389 Other specified diseases of intestine: Secondary | ICD-10-CM | POA: Diagnosis not present

## 2023-01-28 DIAGNOSIS — K635 Polyp of colon: Secondary | ICD-10-CM | POA: Diagnosis not present

## 2023-01-28 DIAGNOSIS — M75122 Complete rotator cuff tear or rupture of left shoulder, not specified as traumatic: Secondary | ICD-10-CM | POA: Diagnosis not present

## 2023-01-28 DIAGNOSIS — K5730 Diverticulosis of large intestine without perforation or abscess without bleeding: Secondary | ICD-10-CM | POA: Diagnosis not present

## 2023-01-28 DIAGNOSIS — K5100 Ulcerative (chronic) pancolitis without complications: Secondary | ICD-10-CM | POA: Diagnosis not present

## 2023-01-28 DIAGNOSIS — M25512 Pain in left shoulder: Secondary | ICD-10-CM | POA: Diagnosis not present

## 2023-01-28 LAB — HM COLONOSCOPY

## 2023-01-31 DIAGNOSIS — M25512 Pain in left shoulder: Secondary | ICD-10-CM | POA: Diagnosis not present

## 2023-01-31 DIAGNOSIS — M75122 Complete rotator cuff tear or rupture of left shoulder, not specified as traumatic: Secondary | ICD-10-CM | POA: Diagnosis not present

## 2023-01-31 LAB — ALKALINE PHOSPHATASE ISOENZYMES
Alkaline phosphatase (APISO): 93 U/L (ref 35–144)
Bone Isoenzymes: 32 % (ref 28–66)
Intestinal Isoenzymes: 3 % (ref 1–24)
Liver Isoenzymes: 65 % (ref 25–69)
Macrohepatic isoenzymes: 0 % (ref <=0)
Placental isoenzymes: 0 % (ref <=0)

## 2023-02-03 DIAGNOSIS — M75122 Complete rotator cuff tear or rupture of left shoulder, not specified as traumatic: Secondary | ICD-10-CM | POA: Diagnosis not present

## 2023-02-03 DIAGNOSIS — M25512 Pain in left shoulder: Secondary | ICD-10-CM | POA: Diagnosis not present

## 2023-02-06 DIAGNOSIS — M25512 Pain in left shoulder: Secondary | ICD-10-CM | POA: Diagnosis not present

## 2023-02-06 DIAGNOSIS — M75122 Complete rotator cuff tear or rupture of left shoulder, not specified as traumatic: Secondary | ICD-10-CM | POA: Diagnosis not present

## 2023-02-10 ENCOUNTER — Ambulatory Visit
Admission: RE | Admit: 2023-02-10 | Discharge: 2023-02-10 | Disposition: A | Discharge status: Home / Self Care | Payer: 59 | Source: Ambulatory Visit | Attending: Neurology | Admitting: Neurology

## 2023-02-10 DIAGNOSIS — M75122 Complete rotator cuff tear or rupture of left shoulder, not specified as traumatic: Secondary | ICD-10-CM | POA: Diagnosis not present

## 2023-02-10 DIAGNOSIS — M25512 Pain in left shoulder: Secondary | ICD-10-CM | POA: Diagnosis not present

## 2023-02-10 DIAGNOSIS — G4733 Obstructive sleep apnea (adult) (pediatric): Secondary | ICD-10-CM | POA: Diagnosis not present

## 2023-02-10 DIAGNOSIS — G2581 Restless legs syndrome: Secondary | ICD-10-CM

## 2023-02-10 DIAGNOSIS — G4734 Idiopathic sleep related nonobstructive alveolar hypoventilation: Secondary | ICD-10-CM | POA: Diagnosis not present

## 2023-02-10 MED ORDER — GADOPICLENOL 0.5 MMOL/ML IV SOLN
10.0000 mL | Freq: Once | INTRAVENOUS | Status: AC | PRN
  Administered 2023-02-10: 10 mL via INTRAVENOUS

## 2023-02-11 ENCOUNTER — Ambulatory Visit: Payer: 59 | Admitting: Neurology

## 2023-02-11 ENCOUNTER — Encounter: Payer: Self-pay | Admitting: Anesthesiology

## 2023-02-11 ENCOUNTER — Other Ambulatory Visit: Payer: Self-pay | Admitting: Neurology

## 2023-02-11 DIAGNOSIS — G2581 Restless legs syndrome: Secondary | ICD-10-CM

## 2023-02-11 DIAGNOSIS — R202 Paresthesia of skin: Secondary | ICD-10-CM

## 2023-02-11 DIAGNOSIS — Z9989 Dependence on other enabling machines and devices: Secondary | ICD-10-CM

## 2023-02-11 DIAGNOSIS — G4731 Primary central sleep apnea: Secondary | ICD-10-CM

## 2023-02-11 DIAGNOSIS — G4733 Obstructive sleep apnea (adult) (pediatric): Secondary | ICD-10-CM | POA: Diagnosis not present

## 2023-02-11 NOTE — Progress Notes (Signed)
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 There is not enough canal stenosis to affect lower extremities. EMG correlation is recommended.

## 2023-02-12 ENCOUNTER — Encounter: Payer: Self-pay | Admitting: Neurology

## 2023-02-12 NOTE — Addendum Note (Signed)
Addended by: Melvyn Novas on: 02/12/2023 01:03 PM   Modules accepted: Orders

## 2023-02-12 NOTE — Progress Notes (Signed)
[facility]   HOME SLEEP TEST REPORT ( by Watch PAT)   STUDY DATE:  02-12-2023 Patrick Oconnor 57 year old male 04/29/1966   ORDERING CLINICIAN: Melvyn Novas, MD  REFERRING CLINICIAN: Crawford Givens, MD   CLINICAL INFORMATION/HISTORY:  Patrick Oconnor is a 57 y.o. 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 He is already on CPAP and has RLS as well / treated with oral iron. Dx of OSA by Dr Nicholos Johns. The study was performed  10-15-2018 by [facility]-the result was a total recording time of 592 minutes, dominated by Obstructive apneas and hypopneas and some central events as well as some mixed apneas= altogether at an AHI of 38.1/h.    Epworth sleepiness score: 4/24.  FSS at  42/63 points    BMI: 31.4 kg/m   Neck Circumference: 19"   FINDINGS:   Sleep Summary:   Total Recording Time (hours, min):   8 hours 44 minutes  Total Sleep Time (hours, min):      7 hours 52 minutes           Percent REM (%):     12.2%                                   Respiratory Indices:   Calculated pAHI (per hour):    By AASM criteria the baseline AHI is 74.7/h which is a very severe degree of apnea.   Only 15.3% for considered central sleep apnea which I think is an understatement.  Nearly all apneas were associated with a desaturation of 4% or more, 520 total apneas were falling into this category.                      REM pAHI:   61.6/h                                              NREM pAHI:    76.3/h.  The expected and obstructive sleep apnea to be more REM dependent apnea this definitely is a more complex form of apnea by distribution between REM and non-REM REM sleep stages.                          Positional AHI: Supine sleep was the dominant sleep position with an AHI of 75/h there were only 7.5 minutes of right lateral sleep noted.                                                 Oxygen Saturation Statistics:     O2 Saturation Range (%): Between a nadir at 81% and a maximum saturation of 100% with a mean oxygen saturation at 94%.                                      O2 Saturation (minutes) <89%:   13.4 minutes,  total time in hypoxia < 90% saturation:  21.8 minutes        Pulse Rate Statistics:   Pulse Mean (bpm):    54 bpm             Pulse Range:    Between 44 and 94 bpm             IMPRESSION:  This HST confirms the presence of severe and complex sleep apnea.  This home sleep test can only confirm the severity of apnea, and calculated 15.3% of apneas to be central.  Mixed apneas cannot be detected by this methodology.  There is significant hypoxia present which makes an inspire device not the best treatment of choice.  If the patient would accept a partial treatment of apnea to be sufficient he could go ahead with a dental device or an inspire device.  I do think that this step would be at considerable risk for his health.   RECOMMENDATION: I strongly recommend to continue positive airway pressure therapy and if there is a question of comfort, we could consider BiPAP therapy. The previous settings of the patient's CPAP machine were not known to me I we will prescribe an outer titration ResMed device with a setting between 6 and 20 cmH2O pressure was 2 cm EPR, heated humidification and interface of patient's comfort and choice. Revisit in our sleep clinic between a minimum of 30 days of therapy and a maximum of 90 days.      INTERPRETING PHYSICIAN:   Melvyn Novas, MD

## 2023-02-14 DIAGNOSIS — M25512 Pain in left shoulder: Secondary | ICD-10-CM | POA: Diagnosis not present

## 2023-02-14 DIAGNOSIS — M75122 Complete rotator cuff tear or rupture of left shoulder, not specified as traumatic: Secondary | ICD-10-CM | POA: Diagnosis not present

## 2023-02-16 NOTE — Procedures (Signed)
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 He is already on CPAP and has RLS as well / treated with oral iron. Dx of OSA by Dr Nicholos Johns. The study was performed  10-15-2018 by [facility]-the result was a total recording time of 592 minutes, dominated by Obstructive apneas and hypopneas and some central events as well as some mixed apneas= altogether at an AHI of 38.1/h.    Epworth sleepiness score: 4/24.  FSS at  42/63 points    BMI: 31.4 kg/m   Neck Circumference: 19"   FINDINGS:   Sleep Summary:   Total Recording Time (hours, min):   8 hours 44 minutes  Total Sleep Time (hours, min):      7 hours 52 minutes           Percent REM (%):     12.2%                                   Respiratory Indices:   Calculated pAHI (per hour):    By AASM criteria the baseline AHI is 74.7/h which is a very severe degree of apnea.   Only 15.3% for considered central sleep apnea which I think is an understatement.  Nearly all apneas were associated with a desaturation of 4% or more, 520 total apneas were falling into this category.                      REM pAHI:   61.6/h                                              NREM pAHI:    76.3/h.  The expected and obstructive sleep apnea to be more REM dependent apnea this definitely is a more complex form of apnea by distribution between REM and non-REM REM sleep stages.                          Positional AHI: Supine sleep was the dominant sleep position with an AHI of 75/h there were only 7.5 minutes of right lateral sleep noted.                                                 Oxygen Saturation Statistics:    O2  Saturation Range (%): Between a nadir at 81% and a maximum saturation of 100% with a mean oxygen saturation at 94%.                                      O2 Saturation (minutes) <89%:   13.4 minutes,  total time in hypoxia < 90% saturation:  21.8 minutes        Pulse Rate Statistics:   Pulse Mean (bpm):    54 bpm             Pulse Range:    Between 44 and 94 bpm             IMPRESSION:  This HST confirms the presence of severe and complex sleep apnea.  This home sleep test can only confirm the severity of apnea, and calculated 15.3% of apneas to be central.  Mixed apneas cannot be detected by this methodology.  There is significant hypoxia present which makes an inspire device not the best treatment of choice.  If the patient would accept a partial treatment of apnea to be sufficient he could go ahead with a dental device or an inspire device.  I do think that this step would be at considerable risk for his health.   RECOMMENDATION: I strongly recommend to continue positive airway pressure therapy and if there is a question of comfort, we could consider BiPAP therapy. The previous settings of the patient's CPAP machine were not known to me I we will prescribe an outer titration ResMed device with a setting between 6 and 20 cmH2O pressure was 2 cm EPR, heated humidification and interface of patient's comfort and choice. Revisit in our sleep clinic between a minimum of 30 days of therapy and a maximum of 90 days.      INTERPRETING PHYSICIAN:   Melvyn Novas, MD

## 2023-02-16 NOTE — Addendum Note (Signed)
Addended by: Melvyn Novas on: 02/16/2023 01:12 PM   Modules accepted: Orders

## 2023-02-16 NOTE — Progress Notes (Signed)
By AASM criteria the baseline AHI is 74.7/h which is a very severe degree of apnea.   Only 15.3% for considered central sleep apnea which I think is an understatement.  Nearly all apneas were associated with a desaturation of 4% or more, 520 total apneas were falling into this category. Plus:  total time in hypoxia < 90% saturation:  21.8 minutes.There is significant hypoxia present which makes an inspire device not the best treatment of choice.  If the patient would accept a partial treatment of apnea to be sufficient he could go ahead with a dental device or an inspire device.  I do think that this step would be at considerable risk for his health.                 I will order a new CPAP .

## 2023-02-17 DIAGNOSIS — Z4789 Encounter for other orthopedic aftercare: Secondary | ICD-10-CM | POA: Diagnosis not present

## 2023-02-17 NOTE — Telephone Encounter (Signed)
Patrick Oconnor NPR sent to [facility] for [facility] [phone]

## 2023-02-18 ENCOUNTER — Telehealth: Payer: Self-pay

## 2023-02-18 DIAGNOSIS — M75122 Complete rotator cuff tear or rupture of left shoulder, not specified as traumatic: Secondary | ICD-10-CM | POA: Diagnosis not present

## 2023-02-18 DIAGNOSIS — M25512 Pain in left shoulder: Secondary | ICD-10-CM | POA: Diagnosis not present

## 2023-02-18 NOTE — Telephone Encounter (Signed)
-----   Message from Melvyn Novas, MD sent at 02/16/2023  1:12 PM EDT -----   By AASM criteria the baseline AHI is 74.7/h which is a very severe degree of apnea.   Only 15.3% for considered central sleep apnea which I think is an understatement.  Nearly all apneas were associated with a desaturation of 4% or more, 520 total apneas were falling into this category. Plus:  total time in hypoxia < 90% saturation:  21.8 minutes.There is significant hypoxia present which makes an inspire device not the best treatment of choice.  If the patient would accept a partial treatment of apnea to be sufficient he could go ahead with a dental device or an inspire device.  I do think that this step would be at considerable risk for his health.                 I will order a new CPAP .

## 2023-02-18 NOTE — Telephone Encounter (Signed)
I called patient to discuss. No answer, left a message asking him to call us back. Please route to POD 3. 

## 2023-02-20 DIAGNOSIS — M25512 Pain in left shoulder: Secondary | ICD-10-CM | POA: Diagnosis not present

## 2023-02-20 DIAGNOSIS — M75122 Complete rotator cuff tear or rupture of left shoulder, not specified as traumatic: Secondary | ICD-10-CM | POA: Diagnosis not present

## 2023-02-24 ENCOUNTER — Encounter: Payer: Self-pay | Admitting: Neurology

## 2023-02-25 ENCOUNTER — Encounter: Payer: Self-pay | Admitting: Neurology

## 2023-02-25 ENCOUNTER — Telehealth: Payer: Self-pay

## 2023-02-25 DIAGNOSIS — M25512 Pain in left shoulder: Secondary | ICD-10-CM | POA: Diagnosis not present

## 2023-02-25 DIAGNOSIS — M75122 Complete rotator cuff tear or rupture of left shoulder, not specified as traumatic: Secondary | ICD-10-CM | POA: Diagnosis not present

## 2023-02-25 NOTE — Telephone Encounter (Signed)
Called patient to inform him about his severe sleep apnea. Patient stated he understood and has been using his CPAP machine. Patient mention he has spoken to Dr Vickey Huger and wanted to get the [facility] for sleep, so he could travel better when his retirement comes up.. Patient is wanting to know if he is a candidate for inspire. Advised the patient I would reach out to Dr Vickey Huger and see what her thoughts and recommendations are on inspire. We will notify pt after discussing. Pt also wanting to discuss ropinirole medication. He states his RLS is still bothering him and the 4 tablets a day is helping. He needs a refill but wanted to know if willing to allow up to 4 tablet daily.

## 2023-02-27 DIAGNOSIS — M25512 Pain in left shoulder: Secondary | ICD-10-CM | POA: Diagnosis not present

## 2023-02-27 DIAGNOSIS — M75122 Complete rotator cuff tear or rupture of left shoulder, not specified as traumatic: Secondary | ICD-10-CM | POA: Diagnosis not present

## 2023-03-04 DIAGNOSIS — M25512 Pain in left shoulder: Secondary | ICD-10-CM | POA: Diagnosis not present

## 2023-03-04 DIAGNOSIS — M75122 Complete rotator cuff tear or rupture of left shoulder, not specified as traumatic: Secondary | ICD-10-CM | POA: Diagnosis not present

## 2023-03-11 ENCOUNTER — Other Ambulatory Visit: Payer: Self-pay | Admitting: Oncology

## 2023-03-11 ENCOUNTER — Inpatient Hospital Stay: Admission: RE | Admit: 2023-03-11 | Payer: 59 | Source: Ambulatory Visit

## 2023-03-11 DIAGNOSIS — R202 Paresthesia of skin: Secondary | ICD-10-CM

## 2023-03-11 DIAGNOSIS — G2581 Restless legs syndrome: Secondary | ICD-10-CM

## 2023-03-11 DIAGNOSIS — Z006 Encounter for examination for normal comparison and control in clinical research program: Secondary | ICD-10-CM

## 2023-03-11 DIAGNOSIS — M79609 Pain in unspecified limb: Secondary | ICD-10-CM

## 2023-03-11 DIAGNOSIS — G4733 Obstructive sleep apnea (adult) (pediatric): Secondary | ICD-10-CM | POA: Diagnosis not present

## 2023-03-11 MED ORDER — GADOPICLENOL 0.5 MMOL/ML IV SOLN
10.0000 mL | Freq: Once | INTRAVENOUS | Status: AC | PRN
  Administered 2023-03-11: 10 mL via INTRAVENOUS

## 2023-03-12 ENCOUNTER — Other Ambulatory Visit (HOSPITAL_COMMUNITY): Payer: Self-pay

## 2023-03-12 MED ORDER — MESALAMINE 1.2 G PO TBEC
2.4000 g | DELAYED_RELEASE_TABLET | Freq: Every day | ORAL | 0 refills | Status: DC
  Filled 2023-03-12 (×2): qty 180, 90d supply, fill #0

## 2023-04-02 ENCOUNTER — Ambulatory Visit: Payer: 59 | Admitting: Neurology

## 2023-04-15 ENCOUNTER — Other Ambulatory Visit: Payer: Self-pay

## 2023-04-15 ENCOUNTER — Other Ambulatory Visit (HOSPITAL_COMMUNITY): Payer: Self-pay

## 2023-04-28 ENCOUNTER — Encounter: Payer: 59 | Admitting: Neurology

## 2023-05-05 ENCOUNTER — Ambulatory Visit: Payer: 59 | Admitting: Neurology

## 2023-05-05 ENCOUNTER — Ambulatory Visit (INDEPENDENT_AMBULATORY_CARE_PROVIDER_SITE_OTHER): Payer: Self-pay | Admitting: Neurology

## 2023-05-05 DIAGNOSIS — R202 Paresthesia of skin: Secondary | ICD-10-CM

## 2023-05-05 DIAGNOSIS — G2581 Restless legs syndrome: Secondary | ICD-10-CM

## 2023-05-05 DIAGNOSIS — Z0289 Encounter for other administrative examinations: Secondary | ICD-10-CM

## 2023-05-05 NOTE — Progress Notes (Signed)
Full Name: Patrick Oconnor Gender: Male MRN #: [id number] Date of Birth: May 29, 1966    Visit Date: 05/05/2023 13:31 Age: 57 Years Examining Physician: Dr. Naomie Dean Referring Physician: Dr. Porfirio Mylar Dohmeier Height: 6 feet 2 inch  History: Restless legs on ropinirole, evaluate for neuropathy with EMG nerve conduction study as etiology. Patient states symptoms are in both legs, in the arms, whole body. LE strength exam normal. an walk on heels, no weakness in dorsiflexion, foot eversion and inversion bilaterally.  Summary: Nerve conduction studies were performed on the bilateral lower extremities.  The left superficial peroneal sensory nerve showed delayed distal peak latency (4.8 ms, normal less than 4.4) and the peroneal motor nerve showed a drop in amplitude of 1.5 mV (normal less than 1.1) across the left knee.All remaining nerves (as indicated in the following tables) were within normal limits.  EMG was performed on the left lower extremity. All muscles (as indicated in the following tables) were within normal limits.    Conclusion:  There is no evidence for large-fiber peripheral polyneuropathy.  Left peroneal conduction abnormality appears asymptomatic and of no clinical significance to his current symptoms which are bilateral.    ------------------------------- Naomie Dean, M.D.  [facility] [address], [address] [place], [place] [address] Tel: [phone] Fax: [phone]  Verbal informed consent was obtained from the patient, patient was informed of potential risk of procedure, including bruising, bleeding, hematoma formation, infection, muscle weakness, muscle pain, numbness, among others.        MNC    Nerve / Sites Muscle Latency Ref. Amplitude Ref. Rel Amp Segments Distance Velocity Ref. Area    ms ms mV mV %  cm m/s m/s mVms  L Peroneal - EDB     Ankle EDB 5.9 <=6.5 2.9 >=2.0 100 Ankle - EDB 9   9.9     Fib head EDB 12.7  4.2  143 Fib  head - Ankle 30.6 45 >=44 15.2     Pop fossa EDB 15.8  2.7  63.5 Pop fossa - Fib head 14 45 >=44 9.5         Pop fossa - Ankle      R Peroneal - EDB     Ankle EDB 5.4 <=6.5 2.7 >=2.0 100 Ankle - EDB 9   10.3     Fib head EDB 11.9  2.2  81.7 Fib head - Ankle 29 45 >=44 8.4     Pop fossa EDB 14.9  3.5  155 Pop fossa - Fib head 14.6 48 >=44 14.8         Pop fossa - Ankle      L Tibial - AH     Ankle AH 4.6 <=5.8 9.7 >=4.0 100 Ankle - AH 9   25.7     Pop fossa AH 15.1  6.5  67.2 Pop fossa - Ankle 44 42 >=41 22.9  R Tibial - AH     Ankle AH 4.4 <=5.8 10.7 >=4.0 100 Ankle - AH 9   30.0     Pop fossa AH 14.3  8.5  79.6 Pop fossa - Ankle 43 43 >=41 24.7             SNC    Nerve / Sites Rec. Site Peak Lat Ref.  Amp Ref. Segments Distance    ms ms V V  cm  L Sural - Ankle (Calf)     Calf Ankle 3.9 <=4.4 10 >=6 Calf - Ankle  14  R Sural - Ankle (Calf)     Calf Ankle 4.4 <=4.4 9 >=6 Calf - Ankle 14  L Superficial peroneal - Ankle     Lat leg Ankle 4.8 <=4.4 10 >=6 Lat leg - Ankle 14  R Superficial peroneal - Ankle     Lat leg Ankle 4.3 <=4.4 6 >=6 Lat leg - Ankle 14             F  Wave    Nerve F Lat Ref.   ms ms  L Tibial - AH 55.4 <=56.0  R Tibial - AH 51.1 <=56.0         H Reflex    Nerve H Lat Lat Hmax   ms ms   Left Right Ref. Left Right Ref.  Tibial - Soleus 34.4 21.8 <=35.0 34.7 21.7 <=35.0         EMG Summary Table    Spontaneous MUAP Recruitment  Muscle IA Fib PSW Fasc Other Amp Dur. Poly Pattern  L. Vastus medialis Normal None None None _______ Normal Normal Normal Normal  L. Tibialis anterior Normal None None None _______ Normal Normal Normal Normal  L. Gastrocnemius (Medial head) Normal None None None _______ Normal Normal Normal Normal  L. Extensor hallucis longus Normal None None None _______ Normal Normal Normal Normal  L. Abductor hallucis Normal None None None _______ Normal Normal Normal Normal  L. Biceps femoris (long head) Normal None None None _______  Normal Normal Normal Normal  L. Biceps femoris (short head) Normal None None None _______ Normal Normal Normal Normal

## 2023-05-07 ENCOUNTER — Other Ambulatory Visit (HOSPITAL_COMMUNITY): Payer: Self-pay

## 2023-05-07 ENCOUNTER — Other Ambulatory Visit: Payer: Self-pay

## 2023-05-07 ENCOUNTER — Ambulatory Visit (INDEPENDENT_AMBULATORY_CARE_PROVIDER_SITE_OTHER): Payer: 59 | Admitting: Neurology

## 2023-05-07 VITALS — BP 142/90 | HR 68 | Ht 74.0 in | Wt 247.2 lb

## 2023-05-07 DIAGNOSIS — G2581 Restless legs syndrome: Secondary | ICD-10-CM | POA: Diagnosis not present

## 2023-05-07 DIAGNOSIS — R42 Dizziness and giddiness: Secondary | ICD-10-CM

## 2023-05-07 DIAGNOSIS — G4734 Idiopathic sleep related nonobstructive alveolar hypoventilation: Secondary | ICD-10-CM | POA: Diagnosis not present

## 2023-05-07 DIAGNOSIS — Z9989 Dependence on other enabling machines and devices: Secondary | ICD-10-CM | POA: Diagnosis not present

## 2023-05-07 DIAGNOSIS — G4733 Obstructive sleep apnea (adult) (pediatric): Secondary | ICD-10-CM | POA: Diagnosis not present

## 2023-05-07 DIAGNOSIS — R202 Paresthesia of skin: Secondary | ICD-10-CM | POA: Diagnosis not present

## 2023-05-07 MED ORDER — ROPINIROLE HCL ER 4 MG PO TB24
4.0000 mg | ORAL_TABLET | Freq: Every day | ORAL | 3 refills | Status: DC
  Filled 2023-05-07: qty 90, 90d supply, fill #0
  Filled 2023-08-14: qty 30, 30d supply, fill #1
  Filled 2023-09-19: qty 30, 30d supply, fill #2

## 2023-05-07 MED ORDER — ROPINIROLE HCL 1 MG PO TABS
2.0000 mg | ORAL_TABLET | Freq: Every day | ORAL | 1 refills | Status: DC
Start: 2023-05-07 — End: 2023-11-25
  Filled 2023-05-07 – 2023-08-14 (×2): qty 270, 90d supply, fill #0

## 2023-05-07 MED ORDER — ROPINIROLE HCL 1 MG PO TABS
3.0000 mg | ORAL_TABLET | Freq: Every day | ORAL | Status: DC | PRN
Start: 2023-05-07 — End: 2023-09-18

## 2023-05-07 MED ORDER — BUPROPION HCL ER (XL) 150 MG PO TB24
150.0000 mg | ORAL_TABLET | Freq: Every morning | ORAL | 5 refills | Status: DC
  Filled 2023-05-07: qty 30, 30d supply, fill #0
  Filled 2023-08-14: qty 30, 30d supply, fill #1
  Filled 2023-09-22: qty 30, 30d supply, fill #2
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
## 2023-05-08 ENCOUNTER — Other Ambulatory Visit (HOSPITAL_COMMUNITY): Payer: Self-pay

## 2023-05-12 ENCOUNTER — Other Ambulatory Visit (HOSPITAL_COMMUNITY): Payer: Self-pay

## 2023-05-12 MED ORDER — COVID-19 MRNA VAC-TRIS(PFIZER) 30 MCG/0.3ML IM SUSY
0.3000 mL | PREFILLED_SYRINGE | Freq: Once | INTRAMUSCULAR | 0 refills | Status: AC
  Filled 2023-05-12: qty 0.3, 1d supply, fill #0

## 2023-05-14 ENCOUNTER — Telehealth: Payer: Self-pay

## 2023-05-15 ENCOUNTER — Encounter: Payer: Self-pay | Admitting: Neurology

## 2023-05-15 ENCOUNTER — Other Ambulatory Visit (HOSPITAL_COMMUNITY): Payer: Self-pay

## 2023-05-19 ENCOUNTER — Encounter: Payer: Self-pay | Admitting: Family Medicine

## 2023-05-19 ENCOUNTER — Ambulatory Visit (INDEPENDENT_AMBULATORY_CARE_PROVIDER_SITE_OTHER): Payer: 59 | Admitting: Family Medicine

## 2023-05-19 ENCOUNTER — Other Ambulatory Visit: Payer: Self-pay

## 2023-05-19 VITALS — BP 142/96 | Ht 73.5 in | Wt 235.0 lb

## 2023-05-19 DIAGNOSIS — M79672 Pain in left foot: Secondary | ICD-10-CM | POA: Diagnosis not present

## 2023-05-19 DIAGNOSIS — I10 Essential (primary) hypertension: Secondary | ICD-10-CM

## 2023-05-19 DIAGNOSIS — M2141 Flat foot [pes planus] (acquired), right foot: Secondary | ICD-10-CM | POA: Diagnosis not present

## 2023-05-19 DIAGNOSIS — M2142 Flat foot [pes planus] (acquired), left foot: Secondary | ICD-10-CM | POA: Diagnosis not present

## 2023-05-19 NOTE — Progress Notes (Signed)
DATE OF VISIT: 05/19/2023        Patrick Oconnor DOB: [birth year] MRN: [id number]  CC:  LT foot pain  History- Patrick Oconnor is a 57 y.o. male for evaluation and treatment of Lt foot pain.   Lt lateral foot pain x several weeks Pain along Cuboid & base of 5th MT Pain with standing Improves with walking No injury/trauma No changes to footwear - was wearing a shoe he doesn't wear much when it started Denies numbness/tingling - EMG/NCV 05/05/23 with Dr Naomie Dean for lower ext paresthesias - EMG showing: History: Restless legs on ropinirole, evaluate for neuropathy with EMG nerve conduction study as etiology. Patient states symptoms are in both legs, in the arms, whole body. LE strength exam normal. an walk on heels, no weakness in dorsiflexion, foot eversion and inversion bilaterally.   Summary: Nerve conduction studies were performed on the bilateral lower extremities.  The left superficial peroneal sensory nerve showed delayed distal peak latency (4.8 ms, normal less than 4.4) and the peroneal motor nerve showed a drop in amplitude of 1.5 mV (normal less than 1.1) across the left knee.All remaining nerves (as indicated in the following tables) were within normal limits.  EMG was performed on the left lower extremity. All muscles (as indicated in the following tables) were within normal limits.     Conclusion:  There is no evidence for large-fiber peripheral polyneuropathy.  Left peroneal conduction abnormality appears asymptomatic and of no clinical significance to his current symptoms which are bilateral.   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 Traveling to [place] at the end of the week  Past  Medical History Past Medical History:  Diagnosis Date   Anemia    Asthma, exercise induced    Degenerative disc disease, lumbar    Depression    Environmental allergies    GERD (gastroesophageal reflux disease)    History of bronchitis    Hypertension    IBS (irritable bowel syndrome)    Pneumonia    hx of   PONV (postoperative nausea and vomiting)    Sleep apnea 04/2007   Started CPAP; wears CPAP nightly   Tinnitus aurium, left    Ulcerative colitis [facility])     Past Surgical History Past Surgical History:  Procedure Laterality Date   ACROMIO-CLAVICULAR JOINT REPAIR Right 04/15/2013   Procedure: RIGHT SHOULDER ACROMIO-CLAVICULAR RECONSTRUCTION WITH ALLOGRAFT   ;  Surgeon: Senaida Lange, MD;  [facility];  Service: Orthopedics;  Laterality: Right;   ACROMIO-CLAVICULAR JOINT REPAIR Right 06/03/2013   Procedure: ACROMIO-CLAVICULAR JOINT REPAIR;  Surgeon: Senaida Lange, MD;  [facility];  Service: Orthopedics;  Laterality: Right;   ACROMIO-CLAVICULAR JOINT REPAIR Right 10/19/2015   Procedure: OPEN RIGHT SHOULDER ACROMIO-CLAVICULAR JOINT RECONSTRUCTION ;  Surgeon: Francena Hanly, MD;  [facility];  Service: Orthopedics;  Laterality: Right;   COLONOSCOPY  08/12/2005   "multiple"   CYST EXCISION  08/03/2022   Left perineal area   ESOPHAGOGASTRODUODENOSCOPY  10/11/2007   normal; multiple   ESOPHAGOGASTRODUODENOSCOPY (EGD) WITH PROPOFOL N/A 01/22/2016   Procedure: ESOPHAGOGASTRODUODENOSCOPY (EGD) WITH PROPOFOL;  Surgeon: Carman Ching, MD;  [facility];  Service: Endoscopy;  Laterality: N/A;   INCISION AND DRAINAGE Right 06/03/2013   Procedure: INCISION AND DRAINAGE RIGHT SHOULDER;  Surgeon: Senaida Lange, MD;  [facility];  Service: Orthopedics;  Laterality: Right;   INCISION AND DRAINAGE ABSCESS N/A 07/25/2022   Procedure: INCISION AND DRAINAGE OF SCROTAL ABSCESS;  Surgeon: Riki Altes, MD;  Location: [facility] ORS;  Service: Urology;  Laterality: N/A;    pilonidal cystectomy  42003   x 2   PYLOROPLASTY  infant   pyloric stenosis   SEPTOPLASTY     SHOULDER ARTHROSCOPY WITH ROTATOR CUFF REPAIR Left 10/31/2022   Procedure: Left shoulder arthroscopy, debridement, subacromial decompression, distal clavicle resection, rotator cuff repair;  Surgeon: Francena Hanly, MD;  Location: [facility];  Service: Orthopedics;  Laterality: Left;    SHOULDER OPEN ROTATOR CUFF REPAIR Right 10/19/2015   Procedure: OPEN RIGHT SHOULDER ROTATOR CUFF REPAIR ;  Surgeon: Francena Hanly, MD;  [facility];  Service: Orthopedics;  Laterality: Right;   thrombosed external hemorrhoid  04/13/2007   incised   TONSILLECTOMY AND ADENOIDECTOMY      Medications Current Outpatient Medications  Medication Sig Dispense Refill   acetaminophen (TYLENOL) 500 MG tablet Take 1,000 mg by mouth every 6 (six) hours as needed for moderate pain.     albuterol (VENTOLIN HFA) 108 (90 Base) MCG/ACT inhaler Inhale 1-2 puffs into the lungs every 6 (six) hours as needed for wheezing (prior to exercise.). 8.5 g 2   bisacodyl (DULCOLAX) 5 MG EC tablet 5 mg daily as needed for moderate constipation.     buPROPion (WELLBUTRIN XL) 150 MG 24 hr tablet Take 1 tablet (150 mg total) by mouth every morning. 30 tablet 5   Cholecalciferol (VITAMIN D) 125 MCG (5000 UT) CAPS Take 5,000 Units by mouth daily.     citalopram (CELEXA) 20 MG tablet Take 1 tablet (20 mg total) by mouth daily. 90 tablet 3   colchicine 0.6 MG tablet Take 1 tablet (0.6 mg total) by mouth daily as needed. Okay to fill with mitigare, colchicine, colcrys.  Take with food. 30 tablet 0   cyclobenzaprine (FLEXERIL) 10 MG tablet Take 1 tablet (10 mg total) by mouth 3 (three) times daily as needed for muscle spasms. 30 tablet 1   ferrous sulfate 325 (65 FE) MG tablet Take 1 tablet (325 mg total) by mouth daily.     fluticasone (FLONASE) 50 MCG/ACT nasal spray Place 1 spray into both nostrils daily. 16 g 0   glucosamine-chondroitin 500-400 MG  tablet Take 2 tablets by mouth in the morning.     ibuprofen (ADVIL) 800 MG tablet Take 1 tablet (800 mg total) by mouth 3 (three) times daily. 90 tablet 1   loratadine (CLARITIN) 10 MG tablet Take 10 mg by mouth daily.     mesalamine (LIALDA) 1.2 g EC tablet Take 2.4 g by mouth 3 (three) times daily.     ondansetron (ZOFRAN) 4 MG tablet Take 1 tablet (4 mg total) by mouth every 8 (eight) hours as needed for nausea or vomiting. 10 tablet 0   pantoprazole (PROTONIX) 20 MG tablet Take 1 tablet (20 mg total) by mouth daily. 90 tablet 3   polyethylene glycol powder (GLYCOLAX/MIRALAX) powder Take 17 g by mouth 2 (two) times daily as needed (For constipation.).     Probiotic Product (PROBIOTIC DAILY PO) Take 1 capsule by mouth daily as needed (For digestive health.).      rOPINIRole (REQUIP XL) 4 MG 24 hr tablet Take 1 tablet (4  mg total) by mouth at bedtime. 90 tablet 3   rOPINIRole (REQUIP) 1 MG tablet Take 1-2 tablets (1-2 mg total) by mouth daily as needed (for RLS). 180 tablet 3   rOPINIRole (REQUIP) 1 MG tablet Take 2-3 tablets (2-3 mg total) by mouth at bedtime. 270 tablet 1   rOPINIRole (REQUIP) 1 MG tablet Take 3 tablets (3 mg total) by mouth daily as needed (for RLS).     sennosides-docusate sodium (SENOKOT-S) 8.6-50 MG tablet Take 1-2 tablets by mouth daily as needed for constipation.     Tetrahydroz-Dextran-PEG-Povid (VISINE ADVANCED RELIEF OP) Apply 1-2 drops to eye as needed (For eye irritation.).      No current facility-administered medications for this visit.    Allergies is allergic to septra [sulfamethoxazole-trimethoprim].  Family History - reviewed per EMR and intake form  Social History   reports current alcohol use of about 0.3 standard drinks of alcohol per week.  reports that he has never smoked. He has never been exposed to tobacco smoke. He has never used smokeless tobacco.  reports no history of drug use. OCCUPATION: Works as PT at [employer] - ICU/ER/Neuro  services   EXAM: Vitals: BP (!) 142/96   Ht 6' 1.5" (1.867 m)   Wt 235 lb (106.6 kg)   BMI 30.58 kg/m  General: AOx3, NAD, pleasant SKIN: no rashes or lesions, skin clean, dry, intact MSK: Foot/ankle: Bilateral pes planus.  No significant transverse arch collapse. Left foot and ankle with no significant swelling or bruising.  He has tenderness to palpation at the base of the fifth metatarsal and on the plantar aspect of the base of the fifth metatarsal.  No palpable step-offs or deformity.  Mild tenderness to palpation along the distal peroneus brevis near the insertion of the fifth metatarsal.  No other bony tenderness throughout the midfoot, forefoot, hindfoot.  Negative metatarsal squeeze.  Foot and ankle strength 5/5 without pain. Right foot and ankle without any swelling or bruising.  No tenderness along the base of the fifth metatarsal.  Negative metatarsal squeeze.  Normal foot and ankle strength 5/5 throughout without pain.  Gait: Has overpronation bilaterally with walking  NEURO: sensation intact to light touch lower extremity bilaterally VASC: pulses 2+ and symmetric DP/PT bilaterally, no edema  IMAGING: ULTRASOUND: Limited MSK ultrasound of the left lateral foot was completed in the office today by me Indication: Lt foot pain  Findings:  Normal-appearing fifth metatarsal.  No cortical irregularities, step-offs.  No increased Doppler flow or hyperemia. Insertion of peroneus brevis with hypoechoic change, no increased Doppler flow, no signs of tearing.  Visualized in short axis and long axis views. Normal TMT joints along the lateral foot No other bony abnormalities Normal peroneal tendons at the level of the lateral malleolus  Impression: Mild peroneal tendinopathy of peroneus brevis and insertion of base of the fifth metatarsal, otherwise no other abnormalities. Images saved  Assessment & Plan Left foot pain Left lateral foot pain at base of the fifth metatarsal and  insertion of the peroneus brevis consistent with likely peroneal tendinitis.  No bony abnormalities noted on MSK ultrasound today  Plan: 1.  Limited MSK ultrasound completed as noted above 2.  Patient is limited with NSAID usage due to history of ulcerative colitis and GERD.  Will avoid p.o. NSAIDs.  Can use Voltaren gel applied every 6-8 hours as needed 3.  Discussed using supportive footwear.  He already ordered inserts for his shoes.  He will start using those when  he gets them in the mail. 4.  Recommended ice baths at the end of the day for 15 to 20 minutes.  Can also ice as needed throughout the day as needed 5.  Follow-up after his trip to [place] if worsening or no improvement.  Could consider additional imaging with x-ray at that time or advanced imaging.  Could also consider trial of physical therapy. 6.  Patient expressed understanding and agreement. Pes planus of both feet Bilateral pes planus, may be contributing to his left lateral foot pain  PLAN: Discussed using supportive footwear.  He already ordered inserts for his shoes.  He will start using those when he gets them in the mail. Follow-up after his trip to [place] if worsening or no improvement from OTC inserts he purchased Patient expressed understanding and agreement. HYPERTENSION, MILD Elevated BP noted today  PLAN: Should f/u with PCP for recheck   Encounter Diagnoses  Name Primary?   Left foot pain Yes   Pes planus of both feet    HYPERTENSION, MILD     Orders Placed This Encounter  Procedures   [place] LIMITED JOINT SPACE STRUCTURES LOW LEFT    Orders Placed This Encounter  Procedures   [place] LIMITED JOINT SPACE STRUCTURES LOW LEFT

## 2023-05-19 NOTE — Assessment & Plan Note (Addendum)
Bilateral pes planus, may be contributing to his left lateral foot pain  PLAN: Discussed using supportive footwear.  He already ordered inserts for his shoes.  He will start using those when he gets them in the mail. Follow-up after his trip to [place] if worsening or no improvement from OTC inserts he purchased Patient expressed understanding and agreement.

## 2023-05-19 NOTE — Assessment & Plan Note (Addendum)
Elevated BP noted today  PLAN: Should f/u with PCP for recheck

## 2023-06-20 ENCOUNTER — Other Ambulatory Visit (HOSPITAL_COMMUNITY): Payer: Self-pay

## 2023-06-20 ENCOUNTER — Encounter (HOSPITAL_COMMUNITY): Payer: Self-pay

## 2023-06-23 ENCOUNTER — Other Ambulatory Visit (HOSPITAL_COMMUNITY)
Admission: RE | Admit: 2023-06-23 | Discharge: 2023-06-23 | Disposition: A | Discharge status: Home / Self Care | Payer: 59 | Source: Ambulatory Visit | Attending: Oncology | Admitting: Oncology

## 2023-06-23 DIAGNOSIS — Z006 Encounter for examination for normal comparison and control in clinical research program: Secondary | ICD-10-CM | POA: Insufficient documentation

## 2023-07-01 LAB — GENECONNECT MOLECULAR SCREEN

## 2023-07-01 LAB — HELIX MOLECULAR SCREEN: Genetic Analysis Overall Interpretation: NEGATIVE

## 2023-07-02 ENCOUNTER — Other Ambulatory Visit (HOSPITAL_COMMUNITY): Payer: Self-pay

## 2023-07-02 ENCOUNTER — Telehealth: Payer: Self-pay | Admitting: Family Medicine

## 2023-07-02 MED ORDER — COLCHICINE 0.6 MG PO TABS: 0.6000 mg | ORAL_TABLET | Freq: Every day | ORAL | 1 refills | Status: DC | PRN

## 2023-07-02 MED ORDER — COLCHICINE 0.6 MG PO TABS
0.6000 mg | ORAL_TABLET | Freq: Every day | ORAL | 1 refills | Status: DC | PRN
  Filled 2023-07-02: qty 30, 30d supply, fill #0
  Filled 2023-08-14: qty 30, 30d supply, fill #1

## 2023-07-02 NOTE — Telephone Encounter (Signed)
I accidentally printed rx.  Please shred.  I resent Erx copy correctly.  Thanks.

## 2023-07-02 NOTE — Telephone Encounter (Signed)
Prescription Request  07/02/2023  LOV: 11/12/2022  What is the name of the medication or equipment?  colchicine 0.6 MG tablet  Have you contacted your pharmacy to request a refill? Yes   Which pharmacy would you like this sent to?  [facility] [facility] [address] [place] [address] Phone: [phone] Fax: [phone]    Patient notified that their request is being sent to the clinical staff for review and that they should receive a response within 2 business days.   Please advise at Mobile [phone] (mobile)

## 2023-07-02 NOTE — Telephone Encounter (Signed)
Last office visit: 11/12/22 Next office visit: nothing scheduled Last refill:  colchicine 0.6 MG tablet 02/07/22 30 tablets 0 refills

## 2023-07-23 ENCOUNTER — Ambulatory Visit (INDEPENDENT_AMBULATORY_CARE_PROVIDER_SITE_OTHER): Payer: 59 | Admitting: Family Medicine

## 2023-07-23 ENCOUNTER — Other Ambulatory Visit: Payer: Self-pay

## 2023-07-23 VITALS — BP 126/82 | Ht 73.0 in | Wt 240.0 lb

## 2023-07-23 DIAGNOSIS — M79674 Pain in right toe(s): Secondary | ICD-10-CM | POA: Diagnosis not present

## 2023-07-23 DIAGNOSIS — M79672 Pain in left foot: Secondary | ICD-10-CM | POA: Diagnosis not present

## 2023-07-23 MED ORDER — METHYLPREDNISOLONE ACETATE 40 MG/ML IJ SUSP
40.0000 mg | Freq: Once | INTRAMUSCULAR | Status: AC
  Administered 2023-07-23: 40 mg via INTRA_ARTICULAR

## 2023-07-23 NOTE — Patient Instructions (Signed)
You have arthritis at the base of your great toe. There's evidence of gout as well. You were given an injection today. Icing, ibuprofen as needed. Continue your colchicine. Wear sports insoles - I added lateral posting to the left one to unload the peroneal tendons - hopefully this will decrease your pain over here. Follow up with [place] in 1 month but call sooner if you're struggling.

## 2023-07-24 ENCOUNTER — Encounter: Payer: Self-pay | Admitting: Family Medicine

## 2023-07-24 NOTE — Progress Notes (Signed)
PCP: Joaquim Nam, MD  Subjective:   HPI: Patient is a 57 y.o. male here for right foot pain.  Patient was recently seen for left lateral foot pain on 10/7 consistent with peroneal tendinitis. He still has this but over past couple days in particular has had severe pain base of right great toe. Worse overnight. No trauma or injury. Can get a sharp and fleeting pain in this area. ? Swelling.  Redness and medial pain especially.  Past Medical History:  Diagnosis Date   Anemia    Asthma, exercise induced    Degenerative disc disease, lumbar    Depression    Environmental allergies    GERD (gastroesophageal reflux disease)    History of bronchitis    Hypertension    IBS (irritable bowel syndrome)    Pneumonia    hx of   PONV (postoperative nausea and vomiting)    Sleep apnea 04/2007   Started CPAP; wears CPAP nightly   Tinnitus aurium, left    Ulcerative colitis (HCC)     Current Outpatient Medications on File Prior to Visit  Medication Sig Dispense Refill   acetaminophen (TYLENOL) 500 MG tablet Take 1,000 mg by mouth every 6 (six) hours as needed for moderate pain.     albuterol (VENTOLIN HFA) 108 (90 Base) MCG/ACT inhaler Inhale 1-2 puffs into the lungs every 6 (six) hours as needed for wheezing (prior to exercise.). 8.5 g 2   bisacodyl (DULCOLAX) 5 MG EC tablet 5 mg daily as needed for moderate constipation.     buPROPion (WELLBUTRIN XL) 150 MG 24 hr tablet Take 1 tablet (150 mg total) by mouth every morning. 30 tablet 5   Cholecalciferol (VITAMIN D) 125 MCG (5000 UT) CAPS Take 5,000 Units by mouth daily.     citalopram (CELEXA) 20 MG tablet Take 1 tablet (20 mg total) by mouth daily. 90 tablet 3   colchicine 0.6 MG tablet Take 1 tablet (0.6 mg total) by mouth daily as needed. Okay to fill with mitigare, colchicine, colcrys.  Take with food. 30 tablet 1   cyclobenzaprine (FLEXERIL) 10 MG tablet Take 1 tablet (10 mg total) by mouth 3 (three) times daily as needed for  muscle spasms. 30 tablet 1   ferrous sulfate 325 (65 FE) MG tablet Take 1 tablet (325 mg total) by mouth daily.     fluticasone (FLONASE) 50 MCG/ACT nasal spray Place 1 spray into both nostrils daily. 16 g 0   glucosamine-chondroitin 500-400 MG tablet Take 2 tablets by mouth in the morning.     ibuprofen (ADVIL) 800 MG tablet Take 1 tablet (800 mg total) by mouth 3 (three) times daily. 90 tablet 1   loratadine (CLARITIN) 10 MG tablet Take 10 mg by mouth daily.     mesalamine (LIALDA) 1.2 g EC tablet Take 2.4 g by mouth 3 (three) times daily.     ondansetron (ZOFRAN) 4 MG tablet Take 1 tablet (4 mg total) by mouth every 8 (eight) hours as needed for nausea or vomiting. 10 tablet 0   pantoprazole (PROTONIX) 20 MG tablet Take 1 tablet (20 mg total) by mouth daily. 90 tablet 3   polyethylene glycol powder (GLYCOLAX/MIRALAX) powder Take 17 g by mouth 2 (two) times daily as needed (For constipation.).     Probiotic Product (PROBIOTIC DAILY PO) Take 1 capsule by mouth daily as needed (For digestive health.).      rOPINIRole (REQUIP XL) 4 MG 24 hr tablet Take 1 tablet (4 mg total)  by mouth at bedtime. 90 tablet 3   rOPINIRole (REQUIP) 1 MG tablet Take 1-2 tablets (1-2 mg total) by mouth daily as needed (for RLS). 180 tablet 3   rOPINIRole (REQUIP) 1 MG tablet Take 2-3 tablets (2-3 mg total) by mouth at bedtime. 270 tablet 1   rOPINIRole (REQUIP) 1 MG tablet Take 3 tablets (3 mg total) by mouth daily as needed (for RLS).     sennosides-docusate sodium (SENOKOT-S) 8.6-50 MG tablet Take 1-2 tablets by mouth daily as needed for constipation.     Tetrahydroz-Dextran-PEG-Povid (VISINE ADVANCED RELIEF OP) Apply 1-2 drops to eye as needed (For eye irritation.).      No current facility-administered medications on file prior to visit.    Past Surgical History:  Procedure Laterality Date   ACROMIO-CLAVICULAR JOINT REPAIR Right 04/15/2013   Procedure: RIGHT SHOULDER ACROMIO-CLAVICULAR RECONSTRUCTION WITH  ALLOGRAFT   ;  Surgeon: Senaida Lange, MD;  [facility];  Service: Orthopedics;  Laterality: Right;   ACROMIO-CLAVICULAR JOINT REPAIR Right 06/03/2013   Procedure: ACROMIO-CLAVICULAR JOINT REPAIR;  Surgeon: Senaida Lange, MD;  [facility];  Service: Orthopedics;  Laterality: Right;   ACROMIO-CLAVICULAR JOINT REPAIR Right 10/19/2015   Procedure: OPEN RIGHT SHOULDER ACROMIO-CLAVICULAR JOINT RECONSTRUCTION ;  Surgeon: Francena Hanly, MD;  [facility];  Service: Orthopedics;  Laterality: Right;   COLONOSCOPY  08/12/2005   "multiple"   CYST EXCISION  08/03/2022   Left perineal area   ESOPHAGOGASTRODUODENOSCOPY  10/11/2007   normal; multiple   ESOPHAGOGASTRODUODENOSCOPY (EGD) WITH PROPOFOL N/A 01/22/2016   Procedure: ESOPHAGOGASTRODUODENOSCOPY (EGD) WITH PROPOFOL;  Surgeon: Carman Ching, MD;  [facility];  Service: Endoscopy;  Laterality: N/A;   INCISION AND DRAINAGE Right 06/03/2013   Procedure: INCISION AND DRAINAGE RIGHT SHOULDER;  Surgeon: Senaida Lange, MD;  [facility];  Service: Orthopedics;  Laterality: Right;   INCISION AND DRAINAGE ABSCESS N/A 07/25/2022   Procedure: INCISION AND DRAINAGE OF SCROTAL ABSCESS;  Surgeon: Riki Altes, MD;  Location: [facility];  Service: Urology;  Laterality: N/A;   pilonidal cystectomy  42003   x 2   PYLOROPLASTY  infant   pyloric stenosis   SEPTOPLASTY     SHOULDER ARTHROSCOPY WITH ROTATOR CUFF REPAIR Left 10/31/2022   Procedure: Left shoulder arthroscopy, debridement, subacromial decompression, distal clavicle resection, rotator cuff repair;  Surgeon: Francena Hanly, MD;  Location: [facility];  Service: Orthopedics;  Laterality: Left;    SHOULDER OPEN ROTATOR CUFF REPAIR Right 10/19/2015   Procedure: OPEN RIGHT SHOULDER ROTATOR CUFF REPAIR ;  Surgeon: Francena Hanly, MD;  [facility];  Service: Orthopedics;  Laterality: Right;   thrombosed external hemorrhoid  04/13/2007   incised   TONSILLECTOMY AND ADENOIDECTOMY       Allergies  Allergen Reactions   Septra [Sulfamethoxazole-Trimethoprim] Nausea Only and Other (See Comments)    Heads and joint pain    BP 126/82   Ht 6\' 1"  (1.854 m)   Wt 240 lb (108.9 kg)   BMI 31.66 kg/m       No data to display              No data to display              Objective:  Physical Exam:  Gen: NAD, comfortable in exam room  Right foot: Hallux rigidus.  Minimal redness medial at 1st MTP.  No other gross deformity.  No ecchymoses.   Able to flex and extend toes though mod limitation  1st MTP. Tenderness to palpation 1st MTP.  No other tenderness. NV intact distally.   Limited MSK u/s right foot: mod effusion 1st MTP.  Double contour sign 1st MTP.  Mild neovascularity synovium 1st MTP.  Assessment & Plan:  1. 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 If he's starting to get a flare of gout here as well.  Discussed options - injection given today.  Sports insoles provided - consider 1st ray post added to this.  Icing, tylenol, ibuprofen rarely with h/o ulcerative colitis.  Continue his colchicine.  After informed written consent timeout was performed, patient was seated on exam table. Area overlying right 1st MTP prepped with alcohol swabs then utilizing ultrasound guidance patient's right 1st MTP was injected with 0.5:0.57mL lidocaine: depomedrol 40mg . Patient tolerated the procedure well without immediate complications.  2. Left foot pain - 2/2 peroneal tendinopathy - sports insoles with lateral heel wedge and lateral posting added.  Continue treatments otherwise as discussed last visit.

## 2023-07-30 ENCOUNTER — Other Ambulatory Visit (HOSPITAL_COMMUNITY): Payer: Self-pay

## 2023-07-30 ENCOUNTER — Other Ambulatory Visit: Payer: Self-pay

## 2023-07-30 MED ORDER — MESALAMINE 1.2 G PO TBEC
2.4000 g | DELAYED_RELEASE_TABLET | Freq: Every day | ORAL | 0 refills | Status: DC
  Filled 2023-07-30: qty 180, 90d supply, fill #0

## 2023-08-14 ENCOUNTER — Other Ambulatory Visit: Payer: Self-pay

## 2023-08-14 ENCOUNTER — Other Ambulatory Visit (HOSPITAL_COMMUNITY): Payer: Self-pay

## 2023-08-15 ENCOUNTER — Other Ambulatory Visit (HOSPITAL_COMMUNITY): Payer: Self-pay

## 2023-08-21 DIAGNOSIS — K219 Gastro-esophageal reflux disease without esophagitis: Secondary | ICD-10-CM | POA: Diagnosis not present

## 2023-08-21 DIAGNOSIS — E559 Vitamin D deficiency, unspecified: Secondary | ICD-10-CM | POA: Diagnosis not present

## 2023-08-21 DIAGNOSIS — K5100 Ulcerative (chronic) pancolitis without complications: Secondary | ICD-10-CM | POA: Diagnosis not present

## 2023-08-25 ENCOUNTER — Ambulatory Visit: Payer: 59 | Admitting: Family Medicine

## 2023-09-17 ENCOUNTER — Ambulatory Visit: Payer: Self-pay | Admitting: Family Medicine

## 2023-09-17 NOTE — Telephone Encounter (Signed)
 Noted, happy to see.

## 2023-09-17 NOTE — Telephone Encounter (Signed)
   Chief Complaint: back pain Symptoms: pain Frequency: intensity comes and goes  Disposition: [] ED /[] Urgent Care (no appt availability in office) / [x] Appointment(In office/virtual)/ []  [facility]/ [] Home Care/ [] Refused Recommended Disposition /[] [facility]/ []  Follow-up with PCP Additional Notes: Pt complaining of back pain that started on Sunday or Monday. 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 Between the two, heavy lifting took place and pt is in pain. Pt described pain in the L4/5 region on right side that burns at times. Pt stated the pain shoots down his leg if standing. Pt gets some relief lying down.  Pain varies from a 3 to 7. Pt is going skiing soon would like to enjoy vacation. Per protocol, pt to be seen within 3 days. Pt has appt tomorrow with Dr Avelina at [phone]. RN gave care advice and pt verbalized understanding.           Copied from CRM [id number]. Topic: Clinical - Red Word Triage >> Sep 17, 2023 11:50 AM Victoria A wrote: [facility] that prompted transfer to Nurse Triage: Patient has been experiencing pain lower back 1-10;8 since Monday Reason for Disposition  [1] MODERATE back pain (e.g., interferes with normal activities) AND [2] present > 3 days  Answer Assessment - Initial Assessment Questions 1. ONSET: When did the pain begin?      Sunday/Monday of this week  2. LOCATION: Where does it hurt? (upper, mid or lower back)     Right-L 4/5 down into right side of leg  3. SEVERITY: How bad is the pain?  (e.g., Scale 1-10; mild, moderate, or severe)   - MILD (1-3): Doesn't interfere with normal activities.    - MODERATE (4-7): Interferes with normal activities or awakens from sleep.    - SEVERE (8-10): Excruciating pain, unable to do any normal activities.      Standing-7; lying 5/6; sitting 3 4. PATTERN: Is the pain constant? (e.g., yes, no; constant, intermittent)      Standing-constant;  5. RADIATION: Does  the pain shoot into your legs or somewhere else?     Shoots into right leg 6. CAUSE:  What do you think is causing the back pain?      Overuse working  7. BACK OVERUSE:  Any recent lifting of heavy objects, strenuous work or exercise?     Yes, working on car/physical therapist  8. MEDICINES: What have you taken so far for the pain? (e.g., nothing, acetaminophen , NSAIDS)     Ibuprofen ; lidocaine  patch 9. NEUROLOGIC SYMPTOMS: Do you have any weakness, numbness, or problems with bowel/bladder control?     Denies  10. OTHER SYMPTOMS: Do you have any other symptoms? (e.g., fever, abdomen pain, burning with urination, blood in urine)       Denies  Protocols used: Back Pain-A-AH

## 2023-09-18 ENCOUNTER — Ambulatory Visit: Payer: 59 | Admitting: Family Medicine

## 2023-09-18 ENCOUNTER — Encounter: Payer: Self-pay | Admitting: Family Medicine

## 2023-09-18 VITALS — BP 136/86 | HR 78 | Temp 98.9°F | Ht 73.0 in | Wt 250.6 lb

## 2023-09-18 DIAGNOSIS — M5416 Radiculopathy, lumbar region: Secondary | ICD-10-CM | POA: Diagnosis not present

## 2023-09-18 MED ORDER — PREDNISONE 20 MG PO TABS
ORAL_TABLET | ORAL | 0 refills | Status: DC
Start: 2023-09-18 — End: 2024-01-15

## 2023-09-18 NOTE — Progress Notes (Signed)
 Patient ID: Patrick Oconnor, male    DOB: 17-May-1966, 58 y.o.   MRN: [id number]  This visit was conducted in person.  BP 136/86 (BP Location: Left Arm, Patient Position: Sitting, Cuff Size: Large)   Pulse 78   Temp 98.9 F (37.2 C) (Temporal)   Ht 6' 1 (1.854 m)   Wt 250 lb 9.6 oz (113.7 kg)   SpO2 99%   BMI 33.06 kg/m    CC:  Chief Complaint  Patient presents with   Back Pain    Subjective:   HPI: Patrick Oconnor is a 58 y.o. male patient of Dr. Cleatus presenting on 09/18/2023 for Back Pain  He reports  new onset pain in last week.. moving burning pain  in low back, mainly in right low back and buttock.  Describes as burning pain. Pain worse with standing.  Pain started after working on car in unusual position. 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  Radiation of pain down right leg to knee.  No numbness, no weakness.  No fever, no incontinence.   He has been using ibuprofen  600-800 mg  every 4-10 hours.  Has leftover oxycodone  and has had to occ use it.  Using heat. Using massage.  Doing home PT.    He has history of low back pain intermittently CT lumbar spine from June 2024 showed multilevel severe degenerative change, foraminal stenosis greatest and likely severest on the right at L5-S1, canal stenosis at L3-L4 MRI lumbar spine 03/11/2023: At L1-L2, there is mild spinal stenosis but no nerve root compression. At L2-L3, there are degenerative changes causing moderate right lateral recess stenosis but no spinal stenosis or nerve root compression. At L3-L4, there is mild spinal stenosis due to multifactorial degeneration as detailed above.  There is also moderately severe left foraminal narrowing, severe left lateral recess stenosis and moderately severe right lateral recess stenosis with some potential for left L3 and bilateral L4 nerve root compression. At L4-L5, there is mild spinal stenosis due to multifactorial degenerative change as detailed above.  There is more  moderately severe bilateral foraminal narrowing and moderately severe right lateral recess stenosis with potential for bilateral L4 and right L5 nerve root compression. At L5-S1, there are degenerative changes causing moderately severe right foraminal narrowing with potential for right L5 nerve root compression. Degenerative changes have mildly progressed compared to the 2013 MRI at each level. Complex cystic focus in the right kidney that appears unchanged in size compared to the 2013 MRI.  If clinically indicated, consider ultrasound for further characterization.      Relevant past medical, surgical, family and social history reviewed and updated as indicated. Interim medical history since our last visit reviewed. Allergies and medications reviewed and updated. Outpatient Medications Prior to Visit  Medication Sig Dispense Refill   acetaminophen  (TYLENOL ) 500 MG tablet Take 1,000 mg by mouth every 6 (six) hours as needed for moderate pain.     albuterol  (VENTOLIN  HFA) 108 (90 Base) MCG/ACT inhaler Inhale 1-2 puffs into the lungs every 6 (six) hours as needed for wheezing (prior to exercise.). 8.5 g 2   bisacodyl (DULCOLAX) 5 MG EC tablet 5 mg daily as needed for moderate constipation.     buPROPion  (WELLBUTRIN  XL) 150 MG 24 hr tablet Take 1 tablet (150 mg total) by mouth every morning. 30 tablet 5   Cholecalciferol (VITAMIN D ) 125 MCG (5000 UT) CAPS Take 5,000 Units by mouth daily.     citalopram  (CELEXA ) 20 MG  tablet Take 1 tablet (20 mg total) by mouth daily. 90 tablet 3   colchicine  0.6 MG tablet Take 1 tablet (0.6 mg total) by mouth daily as needed. Okay to fill with mitigare , colchicine , colcrys .  Take with food. 30 tablet 1   cyclobenzaprine  (FLEXERIL ) 10 MG tablet Take 1 tablet (10 mg total) by mouth 3 (three) times daily as needed for muscle spasms. 30 tablet 1   ferrous sulfate  325 (65 FE) MG tablet Take 1 tablet (325 mg total) by mouth daily.     fluticasone  (FLONASE ) 50 MCG/ACT nasal  spray Place 1 spray into both nostrils daily. 16 g 0   glucosamine-chondroitin 500-400 MG tablet Take 2 tablets by mouth in the morning.     ibuprofen  (ADVIL ) 800 MG tablet Take 1 tablet (800 mg total) by mouth 3 (three) times daily. 90 tablet 1   loratadine  (CLARITIN ) 10 MG tablet Take 10 mg by mouth daily.     mesalamine  (LIALDA ) 1.2 g EC tablet Take 2.4 g by mouth 3 (three) times daily.     mesalamine  (LIALDA ) 1.2 g EC tablet Take 2 tablets (2.4 g total) by mouth daily with a meal. 180 tablet 0   ondansetron  (ZOFRAN ) 4 MG tablet Take 1 tablet (4 mg total) by mouth every 8 (eight) hours as needed for nausea or vomiting. 10 tablet 0   pantoprazole  (PROTONIX ) 20 MG tablet Take 1 tablet (20 mg total) by mouth daily. 90 tablet 3   polyethylene glycol powder (GLYCOLAX /MIRALAX ) powder Take 17 g by mouth 2 (two) times daily as needed (For constipation.).     Probiotic Product (PROBIOTIC DAILY PO) Take 1 capsule by mouth daily as needed (For digestive health.).      rOPINIRole  (REQUIP  XL) 4 MG 24 hr tablet Take 1 tablet (4 mg total) by mouth at bedtime. 90 tablet 3   rOPINIRole  (REQUIP ) 1 MG tablet Take 1-2 tablets (1-2 mg total) by mouth daily as needed (for RLS). 180 tablet 3   rOPINIRole  (REQUIP ) 1 MG tablet Take 2-3 tablets (2-3 mg total) by mouth at bedtime. 270 tablet 1   sennosides-docusate sodium  (SENOKOT-S) 8.6-50 MG tablet Take 1-2 tablets by mouth daily as needed for constipation.     Tetrahydroz-Dextran-PEG-Povid (VISINE ADVANCED RELIEF OP) Apply 1-2 drops to eye as needed (For eye irritation.).      rOPINIRole  (REQUIP ) 1 MG tablet Take 3 tablets (3 mg total) by mouth daily as needed (for RLS).     No facility-administered medications prior to visit.     Per HPI unless specifically indicated in ROS section below Review of Systems  Constitutional:  Negative for fatigue and fever.  HENT:  Negative for ear pain.   Eyes:  Negative for pain.  Respiratory:  Negative for cough and shortness  of breath.   Cardiovascular:  Negative for chest pain, palpitations and leg swelling.  Gastrointestinal:  Negative for abdominal pain.  Genitourinary:  Negative for dysuria.  Musculoskeletal:  Negative for arthralgias.  Neurological:  Negative for syncope, light-headedness and headaches.  Psychiatric/Behavioral:  Negative for dysphoric mood.    Objective:  BP 136/86 (BP Location: Left Arm, Patient Position: Sitting, Cuff Size: Large)   Pulse 78   Temp 98.9 F (37.2 C) (Temporal)   Ht 6' 1 (1.854 m)   Wt 250 lb 9.6 oz (113.7 kg)   SpO2 99%   BMI 33.06 kg/m   Wt Readings from Last 3 Encounters:  09/18/23 250 lb 9.6 oz (113.7 kg)  07/23/23 240  lb (108.9 kg)  05/19/23 235 lb (106.6 kg)      Physical Exam Constitutional:      Appearance: He is well-developed.  HENT:     Head: Normocephalic.     Right Ear: Hearing normal.     Left Ear: Hearing normal.     Nose: Nose normal.  Neck:     Thyroid : No thyroid  mass or thyromegaly.     Vascular: No carotid bruit.     Trachea: Trachea normal.  Cardiovascular:     Rate and Rhythm: Normal rate and regular rhythm.     Pulses: Normal pulses.     Heart sounds: Heart sounds not distant. No murmur heard.    No friction rub. No gallop.     Comments: No peripheral edema Pulmonary:     Effort: Pulmonary effort is normal. No respiratory distress.     Breath sounds: Normal breath sounds.  Musculoskeletal:     Cervical back: Normal.     Thoracic back: Normal.     Lumbar back: Tenderness present. No spasms or bony tenderness. Decreased range of motion. Positive right straight leg raise test. Negative left straight leg raise test.     Comments: Pain greatest with back extention  Skin:    General: Skin is warm and dry.     Findings: No rash.  Psychiatric:        Speech: Speech normal.        Behavior: Behavior normal.        Thought Content: Thought content normal.       Results for orders placed or performed during the hospital  encounter of 06/23/23  [facility] Molecular Screen- Blood ([facility])   Collection Time: 06/23/23  1:50 PM  Result Value Ref Range   Genetic Analysis Overall Interpretation Negative    Genetic Disease Assessed      [facility] Tier One Population Screen is a screening test that analyzes 11 genes related to hereditary breast and ovarian cancer (HBOC) syndrome, Lynch syndrome, and familial hypercholesterolemia. This test only reports clinically significant pathogenic and  likely pathogenic variants but does not report variants of uncertain significance (VUS). In addition, analysis of the PMS2 gene excludes exons 11-15, which overlap with a known pseudogene (PMS2CL).    Genetic Analysis Report      No pathogenic or likely pathogenic variants were detected in the genes analyzed by this test.Genetic test results should be interpreted in the context of an individual's personal medical and family history. Alteration to medical management is NOT  recommended based solely on this result. Clinical correlation is advised.Additional Considerations- This is a screening test; individuals may still carry pathogenic or likely pathogenic variant(s) in the tested genes that are not detected by this test.-  For individuals at risk for these or other related conditions based on factors including personal or family history, diagnostic testing is recommended.- The absence of pathogenic or likely pathogenic variant(s) in the analyzed genes, while reassuring,  does not eliminate the possibility of a hereditary condition; there are other variants and genes associated with heart disease and hereditary cancer that are not included in this test.    Genes Tested See Notes    Disclaimer See Notes    Sequencing Location See Notes    Interpretation Methods and Limitations See Notes     Assessment and Plan  Acute right lumbar radiculopathy Assessment & Plan: Acute flare of chronic low back pain.  Significant  degenerative changes in spine per MRI in  2024. Current pain most consistent with irritation of the nerves at the foramen. No red flags.  No evidence of focal vertebral pain.  No indication for imaging today. 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 If pain not improving at end of prednisone  taper he can consider a longer taper of prednisone , he will call to let me know. Encouraged overall back health, correct lifting techniques, weight management and core strengthening.  Return and ER precautions provided   Other orders -     predniSONE ; 3 tabs by mouth daily x 3 days, then 2 tabs by mouth daily x 2 days then 1 tab by mouth daily x 2 days  Dispense: 15 tablet; Refill: 0    No follow-ups on file.   Greig Ring, MD

## 2023-09-18 NOTE — Assessment & Plan Note (Signed)
 Acute flare of chronic low back pain.  Significant degenerative changes in spine per MRI in 2024. Current pain most consistent with irritation of the nerves at the foramen. No red flags.  No evidence of focal vertebral pain.  No indication for imaging today. 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 If pain not improving at end of prednisone  taper he can consider a longer taper of prednisone , he will call to let me know. Encouraged overall back health, correct lifting techniques, weight management and core strengthening.  Return and ER precautions provided

## 2023-09-19 ENCOUNTER — Other Ambulatory Visit (HOSPITAL_COMMUNITY): Payer: Self-pay

## 2023-09-19 ENCOUNTER — Other Ambulatory Visit: Payer: Self-pay | Admitting: Family Medicine

## 2023-09-19 NOTE — Telephone Encounter (Signed)
 Last Fill: 05/07/23  Last OV: 09/18/23 Next OV: None Scheduled  Routing to provider for review/authorization.

## 2023-09-19 NOTE — Telephone Encounter (Signed)
 Copied from CRM [id number]. Topic: Clinical - Medication Refill >> Sep 19, 2023  4:56 PM Viola F wrote: Most Recent Primary Care Visit:  Provider: [facility]  Department: [facility]  Visit Type: LAB  Date: 01/27/2023  Medication: rOPINIRole  (REQUIP  XL) 4 MG  Has the patient contacted their pharmacy? No, patient is out of town on vacation and needs this sent to another pharmacy - request 8 pills   (Agent: If no, request that the patient contact the pharmacy for the refill. If patient does not wish to contact the pharmacy document the reason why and proceed with request.) (Agent: If yes, when and what did the pharmacy advise?)  Is this the correct pharmacy for this prescription? Yes If no, delete pharmacy and type the correct one.  This is the patient's preferred pharmacy:    [facility] [place] - [address] [place] [address] Phone: [phone] Fax: [phone]   Has the prescription been filled recently? Yes  Is the patient out of the medication? Yes  Has the patient been seen for an appointment in the last year OR does the patient have an upcoming appointment? Yes  Can we respond through MyChart? Yes  Agent: Please be advised that Rx refills may take up to 3 business days. We ask that you follow-up with your pharmacy.

## 2023-09-22 ENCOUNTER — Encounter: Payer: Self-pay | Admitting: Family Medicine

## 2023-09-22 ENCOUNTER — Other Ambulatory Visit (HOSPITAL_COMMUNITY): Payer: Self-pay

## 2023-09-22 MED ORDER — ROPINIROLE HCL ER 4 MG PO TB24: 4.0000 mg | ORAL_TABLET | Freq: Every day | ORAL | 0 refills | Status: DC

## 2023-09-22 NOTE — Telephone Encounter (Signed)
 Sent. Thanks.

## 2023-09-23 ENCOUNTER — Other Ambulatory Visit: Payer: Self-pay | Admitting: Family Medicine

## 2023-09-23 ENCOUNTER — Other Ambulatory Visit (HOSPITAL_COMMUNITY): Payer: Self-pay

## 2023-09-30 ENCOUNTER — Other Ambulatory Visit: Payer: Self-pay | Admitting: Neurology

## 2023-09-30 ENCOUNTER — Other Ambulatory Visit (HOSPITAL_COMMUNITY): Payer: Self-pay

## 2023-09-30 ENCOUNTER — Other Ambulatory Visit: Payer: Self-pay | Admitting: Family Medicine

## 2023-09-30 ENCOUNTER — Other Ambulatory Visit (HOSPITAL_COMMUNITY): Payer: Self-pay | Admitting: Student

## 2023-09-30 DIAGNOSIS — Z6831 Body mass index (BMI) 31.0-31.9, adult: Secondary | ICD-10-CM | POA: Diagnosis not present

## 2023-09-30 DIAGNOSIS — M5416 Radiculopathy, lumbar region: Secondary | ICD-10-CM | POA: Diagnosis not present

## 2023-09-30 MED ORDER — METHYLPREDNISOLONE 4 MG PO TBPK
ORAL_TABLET | ORAL | 0 refills | Status: DC
  Filled 2023-09-30: qty 21, 6d supply, fill #0

## 2023-09-30 MED ORDER — OXYCODONE-ACETAMINOPHEN 5-325 MG PO TABS
1.0000 | ORAL_TABLET | Freq: Four times a day (QID) | ORAL | 0 refills | Status: DC | PRN
  Filled 2023-09-30: qty 40, 10d supply, fill #0

## 2023-09-30 NOTE — Telephone Encounter (Signed)
Copied from CRM [id number]. Topic: Clinical - Medication Refill >> Sep 30, 2023  3:40 PM Fredrich Romans wrote: Most Recent Primary Care Visit:  Provider: Kerby Nora E  Department: [facility]  Visit Type: ACUTE  Date: 09/18/2023  Medication:  rOPINIRole (REQUIP XL) 4 MG 24 hr tablet   Has the patient contacted their pharmacy? Yes (Agent: If no, request that the patient contact the pharmacy for the refill. If patient does not wish to contact the pharmacy document the reason why and proceed with request.) (Agent: If yes, when and what did the pharmacy advise?)  Is this the correct pharmacy for this prescription? Yes If no, delete pharmacy and type the correct one.  This is the patient's preferred pharmacy:  [facility] [address] [place] [address] Phone: [phone] Fax: [phone]   Has the prescription been filled recently? Yes  Is the patient out of the medication? Yes  Has the patient been seen for an appointment in the last year OR does the patient have an upcoming appointment? Yes  Can we respond through MyChart? Yes *Patient stated that he needs the regular refill sent into pharmacy for him of 30 pills  Agent: Please be advised that Rx refills may take up to 3 business days. We ask that you follow-up with your pharmacy.

## 2023-09-30 NOTE — Telephone Encounter (Signed)
LAST REFILL: citalopram (CELEXA) 20 MG tablet 09/19/22 90 tablets 0 refills * pantoprazole (PROTONIX) 20 MG tablet 09/19/22 90 tablets 0 refills mesalamine (LIALDA) 1.2 g EC tablet prescribed by GI * colchicine 0.6 MG tablet 07/02/23 30 TABLETS 1 REFILL rOPINIRole (REQUIP XL) 4 MG 24 hr tablet 09/22/23 8 TABLETS 0 REFILLS LAST OFFICE VISIT: 09/18/23 NEXT OFFICE VISIT: NOTHING SCHEDULED

## 2023-10-01 ENCOUNTER — Other Ambulatory Visit (HOSPITAL_COMMUNITY): Payer: Self-pay

## 2023-10-01 ENCOUNTER — Encounter (HOSPITAL_COMMUNITY): Payer: Self-pay

## 2023-10-01 MED ORDER — COLCHICINE 0.6 MG PO TABS
0.6000 mg | ORAL_TABLET | Freq: Every day | ORAL | 1 refills | Status: DC | PRN
  Filled 2023-10-01: qty 30, 30d supply, fill #0

## 2023-10-01 MED ORDER — CITALOPRAM HYDROBROMIDE 20 MG PO TABS
20.0000 mg | ORAL_TABLET | Freq: Every day | ORAL | 3 refills | Status: AC
  Filled 2023-10-01 – 2023-10-14 (×2): qty 90, 90d supply, fill #0
  Filled 2024-01-22: qty 90, 90d supply, fill #1
  Filled 2024-05-11 – 2024-05-21 (×2): qty 90, 90d supply, fill #2
  Filled 2024-08-15: qty 90, 90d supply, fill #3

## 2023-10-01 MED ORDER — ROPINIROLE HCL ER 4 MG PO TB24
4.0000 mg | ORAL_TABLET | Freq: Every day | ORAL | 0 refills | Status: DC
  Filled 2023-10-01: qty 30, 30d supply, fill #0

## 2023-10-01 MED ORDER — PANTOPRAZOLE SODIUM 20 MG PO TBEC
20.0000 mg | DELAYED_RELEASE_TABLET | Freq: Every day | ORAL | 3 refills | Status: AC
  Filled 2023-10-01 – 2023-10-14 (×2): qty 90, 90d supply, fill #0
  Filled 2024-01-22: qty 90, 90d supply, fill #1
  Filled 2024-05-11 – 2024-05-21 (×2): qty 90, 90d supply, fill #2
  Filled 2024-08-15: qty 90, 90d supply, fill #3

## 2023-10-01 MED ORDER — ROPINIROLE HCL ER 4 MG PO TB24
4.0000 mg | ORAL_TABLET | Freq: Every day | ORAL | 0 refills | Status: DC
  Filled 2023-10-01: qty 90, 90d supply, fill #0

## 2023-10-01 NOTE — Telephone Encounter (Signed)
I routed the mesalamine rx to Dr. Levora Angel, with appreciation.

## 2023-10-01 NOTE — Telephone Encounter (Signed)
Last seen on 05/07/23 Follow up scheduled on 11/25/23

## 2023-10-02 ENCOUNTER — Other Ambulatory Visit (HOSPITAL_COMMUNITY): Payer: Self-pay

## 2023-10-02 ENCOUNTER — Ambulatory Visit
Admission: RE | Admit: 2023-10-02 | Discharge: 2023-10-02 | Disposition: A | Discharge status: Home / Self Care | Payer: 59 | Source: Ambulatory Visit | Attending: Student | Admitting: Student

## 2023-10-02 DIAGNOSIS — M4807 Spinal stenosis, lumbosacral region: Secondary | ICD-10-CM | POA: Diagnosis not present

## 2023-10-02 DIAGNOSIS — M47816 Spondylosis without myelopathy or radiculopathy, lumbar region: Secondary | ICD-10-CM | POA: Diagnosis not present

## 2023-10-02 DIAGNOSIS — M48061 Spinal stenosis, lumbar region without neurogenic claudication: Secondary | ICD-10-CM | POA: Diagnosis not present

## 2023-10-02 DIAGNOSIS — M5416 Radiculopathy, lumbar region: Secondary | ICD-10-CM | POA: Insufficient documentation

## 2023-10-02 DIAGNOSIS — M5126 Other intervertebral disc displacement, lumbar region: Secondary | ICD-10-CM | POA: Diagnosis not present

## 2023-10-03 ENCOUNTER — Other Ambulatory Visit (HOSPITAL_COMMUNITY): Payer: Self-pay

## 2023-10-03 MED ORDER — MESALAMINE 1.2 G PO TBEC
2.4000 g | DELAYED_RELEASE_TABLET | Freq: Every day | ORAL | 0 refills | Status: DC
  Filled 2023-10-03 – 2023-10-10 (×2): qty 180, 90d supply, fill #0

## 2023-10-03 NOTE — Telephone Encounter (Signed)
 Approved.

## 2023-10-09 ENCOUNTER — Other Ambulatory Visit (HOSPITAL_COMMUNITY): Payer: Self-pay

## 2023-10-09 DIAGNOSIS — M5416 Radiculopathy, lumbar region: Secondary | ICD-10-CM | POA: Diagnosis not present

## 2023-10-09 DIAGNOSIS — M21371 Foot drop, right foot: Secondary | ICD-10-CM | POA: Diagnosis not present

## 2023-10-09 DIAGNOSIS — Z6831 Body mass index (BMI) 31.0-31.9, adult: Secondary | ICD-10-CM | POA: Diagnosis not present

## 2023-10-09 MED ORDER — GABAPENTIN 300 MG PO CAPS
300.0000 mg | ORAL_CAPSULE | Freq: Every evening | ORAL | 1 refills | Status: DC
  Filled 2023-10-09: qty 30, 30d supply, fill #0
  Filled 2023-11-14: qty 30, 30d supply, fill #1

## 2023-10-09 MED ORDER — OXYCODONE-ACETAMINOPHEN 5-325 MG PO TABS
1.0000 | ORAL_TABLET | ORAL | 0 refills | Status: DC | PRN
  Filled 2023-10-09: qty 40, 7d supply, fill #0

## 2023-10-10 ENCOUNTER — Other Ambulatory Visit (HOSPITAL_COMMUNITY): Payer: Self-pay

## 2023-10-14 ENCOUNTER — Other Ambulatory Visit (HOSPITAL_COMMUNITY): Payer: Self-pay

## 2023-10-22 ENCOUNTER — Other Ambulatory Visit (HOSPITAL_COMMUNITY): Payer: Self-pay

## 2023-10-22 DIAGNOSIS — M5416 Radiculopathy, lumbar region: Secondary | ICD-10-CM | POA: Diagnosis not present

## 2023-10-22 DIAGNOSIS — M48062 Spinal stenosis, lumbar region with neurogenic claudication: Secondary | ICD-10-CM | POA: Diagnosis not present

## 2023-10-22 MED ORDER — METHOCARBAMOL 500 MG PO TABS
500.0000 mg | ORAL_TABLET | Freq: Four times a day (QID) | ORAL | 0 refills | Status: DC
  Filled 2023-10-22: qty 45, 12d supply, fill #0

## 2023-10-22 MED ORDER — OXYCODONE-ACETAMINOPHEN 5-325 MG PO TABS
1.0000 | ORAL_TABLET | ORAL | 0 refills | Status: DC | PRN
  Filled 2023-10-22: qty 40, 7d supply, fill #0

## 2023-10-24 ENCOUNTER — Other Ambulatory Visit: Payer: Self-pay

## 2023-10-24 MED ORDER — METHYLPREDNISOLONE 4 MG PO TBPK
ORAL_TABLET | ORAL | 0 refills | Status: AC
  Filled 2023-10-24: qty 21, 6d supply, fill #0

## 2023-10-31 ENCOUNTER — Other Ambulatory Visit (HOSPITAL_COMMUNITY): Payer: Self-pay

## 2023-10-31 MED ORDER — TIZANIDINE HCL 4 MG PO TABS
4.0000 mg | ORAL_TABLET | Freq: Three times a day (TID) | ORAL | 1 refills | Status: DC | PRN
  Filled 2023-10-31: qty 60, 20d supply, fill #0

## 2023-10-31 MED ORDER — OXYCODONE-ACETAMINOPHEN 5-325 MG PO TABS
1.0000 | ORAL_TABLET | ORAL | 0 refills | Status: DC | PRN
  Filled 2023-10-31: qty 40, 7d supply, fill #0

## 2023-10-31 MED ORDER — METHYLPREDNISOLONE 4 MG PO TBPK
ORAL_TABLET | ORAL | 0 refills | Status: AC
  Filled 2023-10-31: qty 21, 6d supply, fill #0

## 2023-11-06 ENCOUNTER — Other Ambulatory Visit: Payer: Self-pay

## 2023-11-06 MED ORDER — OXYCODONE-ACETAMINOPHEN 5-325 MG PO TABS
1.0000 | ORAL_TABLET | ORAL | 0 refills | Status: DC | PRN
  Filled 2023-11-11: qty 40, 7d supply, fill #0

## 2023-11-11 ENCOUNTER — Other Ambulatory Visit: Payer: Self-pay

## 2023-11-14 ENCOUNTER — Other Ambulatory Visit (HOSPITAL_COMMUNITY): Payer: Self-pay

## 2023-11-24 NOTE — Patient Instructions (Signed)
 Below is our plan:  We will continue to monitor neuropathy symptoms. Try reducing alcohol intake.   Please continue using your CPAP regularly. While your insurance requires that you use CPAP at least 4 hours each night on 70% of the nights, I recommend, that you not skip any nights and use it throughout the night if you can. Getting used to CPAP and staying with the treatment long term does take time and patience and discipline. Untreated obstructive sleep apnea when it is moderate to severe can have an adverse impact on cardiovascular health and raise her risk for heart disease, arrhythmias, hypertension, congestive heart failure, stroke and diabetes. Untreated obstructive sleep apnea causes sleep disruption, nonrestorative sleep, and sleep deprivation. This can have an impact on your day to day functioning and cause daytime sleepiness and impairment of cognitive function, memory loss, mood disturbance, and problems focussing. Using CPAP regularly can improve these symptoms.  We will update supply orders, today.   Please make sure you are staying well hydrated. I recommend 50-60 ounces daily. Well balanced diet and regular exercise encouraged. Consistent sleep schedule with 6-8 hours recommended.   Please continue follow up with care team as directed.   Follow up with me in 1 year   You may receive a survey regarding today's visit. I encourage you to leave honest feed back as I do use this information to improve patient care. Thank you for seeing me today!

## 2023-11-24 NOTE — Progress Notes (Unsigned)
 PATIENT: Patrick Oconnor DOB: 06-07-1966  REASON FOR VISIT: follow up HISTORY FROM: patient  No chief complaint on file.    HISTORY OF PRESENT ILLNESS:  11/24/23 ALL:  Patrick Oconnor is a 58 y.o. male here today for follow up for OSA on CPAP and RLS.     Bupropion  Ropinirole   HISTORY: (copied from Patrick Oconnor's previous note)  Patrick Oconnor is a 58 y.o. 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 History: Restless legs on ropinirole, evaluate for neuropathy with EMG nerve conduction study as etiology. Patient states symptoms are in both legs, in the arms, whole body. LE strength exam normal. an walk on heels, no weakness in dorsiflexion, foot eversion and inversion bilaterally. His NCV showed only peroneal nerve impairment on the left lower extremity. Patrick Oconnor wrote.   There is no evidence for large-fiber peripheral polyneuropathy.  Left peroneal conduction abnormality appears asymptomatic and of no clinical significance to his current symptoms which are bilateral.    Chief concern according to patient :  "My father had a severe neuropathy and later developed cancer. I am just concerned about this affecting me. "   Foot lift weakness. Has stumbled over the left foot, over the big toe.    OSA- severe - confirmed by HST and not a good candidate for Inspire  By AASM criteria the baseline AHI is 74.7/h which is a very severe degree of apnea.   Only 15.3% for considered central sleep apnea which I think is an understatement.  Nearly all apneas were associated with a desaturation of 4% or more, 520 total apneas were falling into this category. Plus:  total time in hypoxia < 90% saturation:  21.8 minutes.There is significant hypoxia present which makes an inspire device not the best treatment of choice.   If the patient would accept a partial treatment of apnea to be sufficient he could  go ahead with a dental device or an inspire device.   I do think that this step would be at considerable risk for his health.                 I would order a new CPAP and forgo INSPIRE   REVIEW OF SYSTEMS: Out of a complete 14 system review of symptoms, the patient complains only of the following symptoms, and all other reviewed systems are negative.  ESS:  ALLERGIES: Allergies  Allergen Reactions   Sulfamethoxazole-Trimethoprim Nausea Only and Other (See Comments)    Heads and joint pain  Other Reaction(s): "feels like flu", arthralgia (joint pain)    HOME MEDICATIONS: Outpatient Medications Prior to Visit  Medication Sig Dispense Refill   acetaminophen (TYLENOL) 500 MG tablet Take 1,000 mg by mouth every 6 (six) hours as needed for moderate pain.     albuterol (VENTOLIN HFA) 108 (90 Base) MCG/ACT inhaler Inhale 1-2 puffs into the lungs every 6 (six) hours as needed for wheezing (prior to exercise.). 8.5 g 2   bisacodyl (DULCOLAX) 5 MG EC tablet 5 mg daily as needed for moderate constipation.     buPROPion (WELLBUTRIN XL) 150 MG 24 hr tablet Take 1 tablet (150 mg total) by mouth every morning. 30 tablet 5   Cholecalciferol (VITAMIN D) 125 MCG (5000 UT) CAPS Take 5,000 Units by mouth daily.     citalopram (CELEXA) 20 MG tablet Take 1 tablet (20  mg total) by mouth daily. 90 tablet 3   colchicine 0.6 MG tablet Take 1 tablet (0.6 mg total) by mouth daily as needed. Take with food. 30 tablet 1   cyclobenzaprine (FLEXERIL) 10 MG tablet Take 1 tablet (10 mg total) by mouth 3 (three) times daily as needed for muscle spasms. 30 tablet 1   ferrous sulfate 325 (65 FE) MG tablet Take 1 tablet (325 mg total) by mouth daily.     fluticasone (FLONASE) 50 MCG/ACT nasal spray Place 1 spray into both nostrils daily. 16 g 0   gabapentin (NEURONTIN) 300 MG capsule Take 1 capsule (300 mg total) by mouth each night. 30 capsule 1   glucosamine-chondroitin 500-400 MG tablet Take 2 tablets by mouth in the  morning.     ibuprofen (ADVIL) 800 MG tablet Take 1 tablet (800 mg total) by mouth 3 (three) times daily. 90 tablet 1   loratadine (CLARITIN) 10 MG tablet Take 10 mg by mouth daily.     mesalamine (LIALDA) 1.2 g EC tablet Take 2.4 g by mouth 3 (three) times daily.     mesalamine (LIALDA) 1.2 g EC tablet Take 2 tablets (2.4 g total) by mouth daily with a meal. 180 tablet 0   ondansetron (ZOFRAN) 4 MG tablet Take 1 tablet (4 mg total) by mouth every 8 (eight) hours as needed for nausea or vomiting. 10 tablet 0   oxyCODONE-acetaminophen (PERCOCET/ROXICET) 5-325 MG tablet Take 1 tablet by mouth every 4 (four) hours as needed. 40 tablet 0   pantoprazole (PROTONIX) 20 MG tablet Take 1 tablet (20 mg total) by mouth daily. 90 tablet 3   polyethylene glycol powder (GLYCOLAX/MIRALAX) powder Take 17 g by mouth 2 (two) times daily as needed (For constipation.).     predniSONE (DELTASONE) 20 MG tablet 3 tabs by mouth daily x 3 days, then 2 tabs by mouth daily x 2 days then 1 tab by mouth daily x 2 days 15 tablet 0   Probiotic Product (PROBIOTIC DAILY PO) Take 1 capsule by mouth daily as needed (For digestive health.).      rOPINIRole (REQUIP XL) 4 MG 24 hr tablet Take 1 tablet (4 mg total) by mouth at bedtime. 90 tablet 0   rOPINIRole (REQUIP XL) 4 MG 24 hr tablet Take 1 tablet (4 mg total) by mouth at bedtime. 30 tablet 0   rOPINIRole (REQUIP) 1 MG tablet Take 2-3 tablets (2-3 mg total) by mouth at bedtime. 270 tablet 1   sennosides-docusate sodium (SENOKOT-S) 8.6-50 MG tablet Take 1-2 tablets by mouth daily as needed for constipation.     Tetrahydroz-Dextran-PEG-Povid (VISINE ADVANCED RELIEF OP) Apply 1-2 drops to eye as needed (For eye irritation.).      tiZANidine (ZANAFLEX) 4 MG tablet Take 1 tablet (4 mg total) by mouth every 8 (eight) hours as needed. 60 tablet 1   No facility-administered medications prior to visit.    PAST MEDICAL HISTORY: Past Medical History:  Diagnosis Date   Anemia     Asthma, exercise induced    Degenerative disc disease, lumbar    Depression    Environmental allergies    GERD (gastroesophageal reflux disease)    History of bronchitis    Hypertension    IBS (irritable bowel syndrome)    Pneumonia    hx of   PONV (postoperative nausea and vomiting)    Sleep apnea 04/2007   Started CPAP; wears CPAP nightly   Tinnitus aurium, left    Ulcerative colitis (HCC)  PAST SURGICAL HISTORY: Past Surgical History:  Procedure Laterality Date   ACROMIO-CLAVICULAR JOINT REPAIR Right 04/15/2013   Procedure: RIGHT SHOULDER ACROMIO-CLAVICULAR RECONSTRUCTION WITH ALLOGRAFT   ;  Surgeon: Senaida Lange, MD;  Location: [facility];  Service: Orthopedics;  Laterality: Right;   ACROMIO-CLAVICULAR JOINT REPAIR Right 06/03/2013   Procedure: ACROMIO-CLAVICULAR JOINT REPAIR;  Surgeon: Senaida Lange, MD;  Location: [facility];  Service: Orthopedics;  Laterality: Right;   ACROMIO-CLAVICULAR JOINT REPAIR Right 10/19/2015   Procedure: OPEN RIGHT SHOULDER ACROMIO-CLAVICULAR JOINT RECONSTRUCTION ;  Surgeon: Francena Hanly, MD;  Location: [facility];  Service: Orthopedics;  Laterality: Right;   COLONOSCOPY  08/12/2005   "multiple"   CYST EXCISION  08/03/2022   Left perineal area   ESOPHAGOGASTRODUODENOSCOPY  10/11/2007   normal; multiple   ESOPHAGOGASTRODUODENOSCOPY (EGD) WITH PROPOFOL N/A 01/22/2016   Procedure: ESOPHAGOGASTRODUODENOSCOPY (EGD) WITH PROPOFOL;  Surgeon: Carman Ching, MD;  Location: [facility];  Service: Endoscopy;  Laterality: N/A;   INCISION AND DRAINAGE Right 06/03/2013   Procedure: INCISION AND DRAINAGE RIGHT SHOULDER;  Surgeon: Senaida Lange, MD;  Location: [facility];  Service: Orthopedics;  Laterality: Right;   INCISION AND DRAINAGE ABSCESS N/A 07/25/2022   Procedure: INCISION AND DRAINAGE OF SCROTAL ABSCESS;  Surgeon: Riki Altes, MD;  Location: [facility];  Service: Urology;  Laterality: N/A;   pilonidal cystectomy  42003   x 2   PYLOROPLASTY  infant   pyloric  stenosis   SEPTOPLASTY     SHOULDER ARTHROSCOPY WITH ROTATOR CUFF REPAIR Left 10/31/2022   Procedure: Left shoulder arthroscopy, debridement, subacromial decompression, distal clavicle resection, rotator cuff repair;  Surgeon: Francena Hanly, MD;  Location: [facility];  Service: Orthopedics;  Laterality: Left;    SHOULDER OPEN ROTATOR CUFF REPAIR Right 10/19/2015   Procedure: OPEN RIGHT SHOULDER ROTATOR CUFF REPAIR ;  Surgeon: Francena Hanly, MD;  Location: [facility];  Service: Orthopedics;  Laterality: Right;   thrombosed external hemorrhoid  04/13/2007   incised   TONSILLECTOMY AND ADENOIDECTOMY      FAMILY HISTORY: Family History  Problem Relation Age of Onset   Cancer Father 15       prostate Stage 4   Prostate cancer Father    Cancer Maternal Grandmother        breast   Cancer Mother    Cancer Paternal Grandmother        Colon   Colon cancer Paternal Grandmother     SOCIAL HISTORY: Social History   Socioeconomic History   Marital status: Single    Spouse name: Not on file   Number of children: Not on file   Years of education: 19   Highest education level: Not on file  Occupational History   Occupation: physical therapist    Employer: [employer]  Tobacco Use   Smoking status: Never    Passive exposure: Never   Smokeless tobacco: Never  Vaping Use   Vaping status: Never Used  Substance and Sexual Activity   Alcohol use: Yes    Alcohol/week: 0.3 standard drinks of alcohol    Comment: rarely   Drug use: No   Sexual activity: Not Currently  Other Topics Concern   Not on file  Social History Narrative   Native of [place], [place]   PT at [facility]   single   Social Drivers of Health   Financial Resource Strain: Not on file  Food Insecurity: Not on file  Transportation Needs: Not on file  Physical Activity: Not on file  Stress: Not on file  Social Connections: Not on file  Intimate Partner Violence: Not on file     PHYSICAL EXAM  There were no vitals filed  for this visit. There is no height or weight on file to calculate BMI.  Generalized: Well developed, in no acute distress  Cardiology: normal rate and rhythm, no murmur noted Respiratory: clear to auscultation bilaterally  Neurological examination  Mentation: Alert oriented to time, place, history taking. Follows all commands speech and language fluent Cranial nerve II-XII: Pupils were equal round reactive to light. Extraocular movements were full, visual field were full  Motor: The motor testing reveals 5 over 5 strength of all 4 extremities. Good symmetric motor tone is noted throughout.  Gait and station: Gait is normal.    DIAGNOSTIC DATA (LABS, IMAGING, TESTING) - I reviewed patient records, labs, notes, testing and imaging myself where available.      No data to display           Lab Results  Component Value Date   WBC 7.5 11/05/2022   HGB 13.8 11/05/2022   HCT 40.3 11/05/2022   MCV 84.1 11/05/2022   PLT 188 11/05/2022      Component Value Date/Time   NA 142 01/20/2023 1454   NA 133 (L) 09/24/2013 1616   K 4.3 01/20/2023 1454   K 3.5 09/24/2013 1616   CL 103 01/20/2023 1454   CL 106 09/24/2013 1616   CO2 21 01/20/2023 1454   CO2 26 09/24/2013 1616   GLUCOSE 95 01/20/2023 1454   GLUCOSE 102 (H) 11/05/2022 1211   GLUCOSE 111 (H) 09/24/2013 1616   BUN 14 01/20/2023 1454   BUN 27 (H) 09/24/2013 1616   CREATININE 0.90 01/20/2023 1454   CREATININE 0.76 06/28/2016 1204   CALCIUM 9.4 01/20/2023 1454   CALCIUM 8.9 09/24/2013 1616   PROT 7.3 01/20/2023 1454   PROT 8.3 (H) 09/24/2013 1616   ALBUMIN 4.7 01/20/2023 1454   ALBUMIN 4.4 09/24/2013 1616   AST 29 01/20/2023 1454   AST 34 09/24/2013 1616   ALT 30 01/20/2023 1454   ALT 40 09/24/2013 1616   ALKPHOS 122 (H) 01/20/2023 1454   ALKPHOS 116 09/24/2013 1616   BILITOT 0.4 01/20/2023 1454   BILITOT 0.8 09/24/2013 1616   GFRNONAA >60 11/05/2022 1211   GFRNONAA >89 06/28/2016 1204   GFRAA >89 06/28/2016 1204    Lab Results  Component Value Date   CHOL 148 09/12/2022   HDL 30.90 (L) 09/12/2022   LDLCALC 82 09/12/2022   LDLDIRECT 94.0 10/26/2019   TRIG 176.0 (H) 09/12/2022   CHOLHDL 5 09/12/2022   No results found for: "HGBA1C" Lab Results  Component Value Date   VITAMINB12 591 08/01/2020   Lab Results  Component Value Date   TSH 2.02 09/12/2022     ASSESSMENT AND PLAN 58 y.o. year old male  has a past medical history of Anemia, Asthma, exercise induced, Degenerative disc disease, lumbar, Depression, Environmental allergies, GERD (gastroesophageal reflux disease), History of bronchitis, Hypertension, IBS (irritable bowel syndrome), Pneumonia, PONV (postoperative nausea and vomiting), Sleep apnea (04/2007), Tinnitus aurium, left, and Ulcerative colitis (HCC). here with   No diagnosis found.    Patrick Oconnor is doing well on CPAP therapy. Compliance report reveals ***. *** was encouraged to continue using CPAP nightly and for greater than 4 hours each night. We will update supply orders as indicated. Risks of untreated sleep apnea review and education materials provided. Healthy lifestyle habits encouraged. *** will  follow up in ***, sooner if needed. *** verbalizes understanding and agreement with this plan.    No orders of the defined types were placed in this encounter.    No orders of the defined types were placed in this encounter.     Terrilyn Fick, FNP-C 11/24/2023, 1:55 PM [facility] [address], [address] [place], [place] [address] [phone]

## 2023-11-25 ENCOUNTER — Encounter: Payer: Self-pay | Admitting: Family Medicine

## 2023-11-25 ENCOUNTER — Other Ambulatory Visit (HOSPITAL_COMMUNITY): Payer: Self-pay

## 2023-11-25 ENCOUNTER — Ambulatory Visit (INDEPENDENT_AMBULATORY_CARE_PROVIDER_SITE_OTHER): Payer: 59 | Admitting: Family Medicine

## 2023-11-25 VITALS — BP 150/94 | HR 99 | Ht 73.5 in | Wt 251.5 lb

## 2023-11-25 DIAGNOSIS — G4733 Obstructive sleep apnea (adult) (pediatric): Secondary | ICD-10-CM | POA: Diagnosis not present

## 2023-11-25 DIAGNOSIS — Z9989 Dependence on other enabling machines and devices: Secondary | ICD-10-CM

## 2023-11-25 DIAGNOSIS — G2581 Restless legs syndrome: Secondary | ICD-10-CM

## 2023-11-25 MED ORDER — ROPINIROLE HCL 1 MG PO TABS
1.0000 mg | ORAL_TABLET | Freq: Every evening | ORAL | 1 refills | Status: DC | PRN
  Filled 2023-11-25: qty 140, 70d supply, fill #0
  Filled 2023-11-25: qty 40, 20d supply, fill #0
  Filled 2023-11-25: qty 140, 70d supply, fill #0

## 2023-11-25 MED ORDER — ROPINIROLE HCL ER 4 MG PO TB24
4.0000 mg | ORAL_TABLET | Freq: Every day | ORAL | 1 refills | Status: DC
  Filled 2023-11-25 – 2023-12-18 (×2): qty 90, 90d supply, fill #0
  Filled 2024-01-22 – 2024-03-11 (×2): qty 90, 90d supply, fill #1

## 2023-11-25 NOTE — Progress Notes (Signed)
 Community message sent to [employer] that CPAP supplies order placed.

## 2023-11-25 NOTE — Progress Notes (Signed)
 Marland Kitchen

## 2023-12-08 ENCOUNTER — Other Ambulatory Visit (HOSPITAL_COMMUNITY): Payer: Self-pay

## 2023-12-17 ENCOUNTER — Other Ambulatory Visit: Payer: Self-pay | Admitting: Family Medicine

## 2023-12-17 MED ORDER — SCOPOLAMINE 1 MG/3DAYS TD PT72
1.0000 | MEDICATED_PATCH | TRANSDERMAL | 0 refills | Status: DC
  Filled 2023-12-17: qty 3, 9d supply, fill #0

## 2023-12-17 NOTE — Telephone Encounter (Signed)
 Sent. Thanks.

## 2023-12-17 NOTE — Telephone Encounter (Signed)
 Copied from CRM [id number]. Topic: Clinical - Medication Question >> Dec 17, 2023  3:27 PM Rosamond Comes wrote: Reason for CRM: patient calling in asking for a refill on Scopolamine  patch patient is going on a trip on Monday 12/22/23 and for motion sickness   Pharmacy [facility] at [facility] [address] [place] [address] [phone]  Please call patient when sent in [phone] ok to leave detailed  message

## 2023-12-18 ENCOUNTER — Other Ambulatory Visit: Payer: Self-pay

## 2023-12-18 ENCOUNTER — Ambulatory Visit (INDEPENDENT_AMBULATORY_CARE_PROVIDER_SITE_OTHER): Admitting: Podiatry

## 2023-12-18 DIAGNOSIS — M7751 Other enthesopathy of right foot: Secondary | ICD-10-CM | POA: Diagnosis not present

## 2023-12-18 MED ORDER — METHYLPREDNISOLONE 4 MG PO TBPK
ORAL_TABLET | ORAL | 0 refills | Status: DC
  Filled 2023-12-18: qty 21, 6d supply, fill #0

## 2023-12-18 MED ORDER — MELOXICAM 15 MG PO TABS
15.0000 mg | ORAL_TABLET | Freq: Every day | ORAL | 0 refills | Status: AC
  Filled 2023-12-18: qty 30, 30d supply, fill #0

## 2023-12-18 NOTE — Progress Notes (Signed)
 Subjective:  Patient ID: Patrick Oconnor, male    DOB: Dec 20, 1965,  MRN: [id number]  Chief Complaint  Patient presents with   Toe Pain    Pain in the big toe joint     58 y.o. male presents with the above complaint.  Patient presents with right hallux limitus.  Patient states there is a lot of pain in the big toe joint.  Has been doing this for quite some time has not seen and was prior to seeing me.  Pain scale 7 out of 10 dull achy in nature hurts with ambulation is with pressure he would like to discuss treatment options for this.   Review of Systems: Negative except as noted in the HPI. Denies N/V/F/Ch.  Past Medical History:  Diagnosis Date   Anemia    Asthma, exercise induced    Degenerative disc disease, lumbar    Depression    Environmental allergies    GERD (gastroesophageal reflux disease)    History of bronchitis    Hypertension    IBS (irritable bowel syndrome)    Pneumonia    hx of   PONV (postoperative nausea and vomiting)    Sleep apnea 04/2007   Started CPAP; wears CPAP nightly   Tinnitus aurium, left    Ulcerative colitis (HCC)     Current Outpatient Medications:    meloxicam  (MOBIC ) 15 MG tablet, Take 1 tablet (15 mg total) by mouth daily., Disp: 30 tablet, Rfl: 0   methylPREDNISolone  (MEDROL  DOSEPAK) 4 MG TBPK tablet, Take as directed, Disp: 21 each, Rfl: 0   acetaminophen  (TYLENOL ) 500 MG tablet, Take 1,000 mg by mouth every 6 (six) hours as needed for moderate pain., Disp: , Rfl:    albuterol  (VENTOLIN  HFA) 108 (90 Base) MCG/ACT inhaler, Inhale 1-2 puffs into the lungs every 6 (six) hours as needed for wheezing (prior to exercise.)., Disp: 8.5 g, Rfl: 2   bisacodyl (DULCOLAX) 5 MG EC tablet, 5 mg daily as needed for moderate constipation., Disp: , Rfl:    Cholecalciferol (VITAMIN D ) 125 MCG (5000 UT) CAPS, Take 5,000 Units by mouth daily., Disp: , Rfl:    citalopram  (CELEXA ) 20 MG tablet, Take 1 tablet (20 mg total) by mouth daily., Disp: 90 tablet,  Rfl: 3   colchicine  0.6 MG tablet, Take 1 tablet (0.6 mg total) by mouth daily as needed. Take with food., Disp: 30 tablet, Rfl: 1   cyclobenzaprine  (FLEXERIL ) 10 MG tablet, Take 1 tablet (10 mg total) by mouth 3 (three) times daily as needed for muscle spasms., Disp: 30 tablet, Rfl: 1   ferrous sulfate  325 (65 FE) MG tablet, Take 1 tablet (325 mg total) by mouth daily., Disp: , Rfl:    fluticasone  (FLONASE ) 50 MCG/ACT nasal spray, Place 1 spray into both nostrils daily., Disp: 16 g, Rfl: 0   gabapentin  (NEURONTIN ) 300 MG capsule, Take 1 capsule (300 mg total) by mouth each night., Disp: 30 capsule, Rfl: 1   glucosamine-chondroitin 500-400 MG tablet, Take 2 tablets by mouth in the morning., Disp: , Rfl:    ibuprofen  (ADVIL ) 800 MG tablet, Take 1 tablet (800 mg total) by mouth 3 (three) times daily., Disp: 90 tablet, Rfl: 1   loratadine  (CLARITIN ) 10 MG tablet, Take 10 mg by mouth daily., Disp: , Rfl:    mesalamine  (LIALDA ) 1.2 g EC tablet, Take 2.4 g by mouth 3 (three) times daily. (Patient not taking: Reported on 11/25/2023), Disp: , Rfl:    mesalamine  (LIALDA ) 1.2 g EC tablet,  Take 2 tablets (2.4 g total) by mouth daily with a meal., Disp: 180 tablet, Rfl: 0   ondansetron  (ZOFRAN ) 4 MG tablet, Take 1 tablet (4 mg total) by mouth every 8 (eight) hours as needed for nausea or vomiting., Disp: 10 tablet, Rfl: 0   oxyCODONE -acetaminophen  (PERCOCET/ROXICET) 5-325 MG tablet, Take 1 tablet by mouth every 4 (four) hours as needed., Disp: 40 tablet, Rfl: 0   pantoprazole  (PROTONIX ) 20 MG tablet, Take 1 tablet (20 mg total) by mouth daily., Disp: 90 tablet, Rfl: 3   polyethylene glycol powder (GLYCOLAX /MIRALAX ) powder, Take 17 g by mouth 2 (two) times daily as needed (For constipation.)., Disp: , Rfl:    predniSONE  (DELTASONE ) 20 MG tablet, 3 tabs by mouth daily x 3 days, then 2 tabs by mouth daily x 2 days then 1 tab by mouth daily x 2 days, Disp: 15 tablet, Rfl: 0   Probiotic Product (PROBIOTIC DAILY PO),  Take 1 capsule by mouth daily as needed (For digestive health.). , Disp: , Rfl:    rOPINIRole  (REQUIP  XL) 4 MG 24 hr tablet, Take 1 tablet (4 mg total) by mouth at bedtime., Disp: 90 tablet, Rfl: 1   rOPINIRole  (REQUIP ) 1 MG tablet, Take 1-2 tablets (1-2 mg total) by mouth at bedtime as needed., Disp: 180 tablet, Rfl: 1   scopolamine  (TRANSDERM-SCOP) 1 MG/3DAYS, Place 1 patch (1.5 mg total) onto the skin every 3 (three) days., Disp: 3 patch, Rfl: 0   sennosides-docusate sodium  (SENOKOT-S) 8.6-50 MG tablet, Take 1-2 tablets by mouth daily as needed for constipation., Disp: , Rfl:    Tetrahydroz-Dextran-PEG-Povid (VISINE ADVANCED RELIEF OP), Apply 1-2 drops to eye as needed (For eye irritation.). , Disp: , Rfl:    tiZANidine  (ZANAFLEX ) 4 MG tablet, Take 1 tablet (4 mg total) by mouth every 8 (eight) hours as needed., Disp: 60 tablet, Rfl: 1  Social History   Tobacco Use  Smoking Status Never   Passive exposure: Never  Smokeless Tobacco Never    Allergies  Allergen Reactions   Sulfamethoxazole -Trimethoprim  Nausea Only and Other (See Comments)    Heads and joint pain  Other Reaction(s): "feels like flu", arthralgia (joint pain)   Objective:  There were no vitals filed for this visit. There is no height or weight on file to calculate BMI. Constitutional Well developed. Well nourished.  Vascular Dorsalis pedis pulses palpable bilaterally. Posterior tibial pulses palpable bilaterally. Capillary refill normal to all digits.  No cyanosis or clubbing noted. Pedal hair growth normal.  Neurologic Normal speech. Oriented to person, place, and time. Epicritic sensation to light touch grossly present bilaterally.  Dermatologic Nails well groomed and normal in appearance. No open wounds. No skin lesions.  Orthopedic: Pain to palpation of right first metatarsophalangeal joint pain with range of motion of the joint.  Limited range of motion noted hallux limitus clinically appreciated.  Some  intra-articular pain noted.   Radiographs: None Assessment:   1. Capsulitis of metatarsophalangeal (MTP) joint of right foot    Plan:  Patient was evaluated and treated and all questions answered.  Right first metatarsophalangeal joint capsulitis with underlying hallux limitus - All questions and concerns were discussed with the patient. - I discussed shoe gear modification extensive detail. - Given the amount of pain that he is having he will benefit from a steroid injection to help decrease acute inflammatory component associate with pain.  Patient agrees with plan to proceed with steroid injection -A steroid injection was performed at right first MTP using 1%  plain Lidocaine  and 10 mg of Kenalog. This was well tolerated.    No follow-ups on file.

## 2024-01-13 ENCOUNTER — Encounter: Admitting: Family Medicine

## 2024-01-13 ENCOUNTER — Ambulatory Visit: Admitting: Podiatry

## 2024-01-15 ENCOUNTER — Encounter: Payer: Self-pay | Admitting: Family Medicine

## 2024-01-15 ENCOUNTER — Ambulatory Visit (INDEPENDENT_AMBULATORY_CARE_PROVIDER_SITE_OTHER): Admitting: Family Medicine

## 2024-01-15 VITALS — BP 136/84 | HR 61 | Temp 99.0°F | Ht 73.23 in | Wt 248.8 lb

## 2024-01-15 DIAGNOSIS — K51919 Ulcerative colitis, unspecified with unspecified complications: Secondary | ICD-10-CM

## 2024-01-15 DIAGNOSIS — E559 Vitamin D deficiency, unspecified: Secondary | ICD-10-CM

## 2024-01-15 DIAGNOSIS — Z7189 Other specified counseling: Secondary | ICD-10-CM

## 2024-01-15 DIAGNOSIS — Z125 Encounter for screening for malignant neoplasm of prostate: Secondary | ICD-10-CM

## 2024-01-15 DIAGNOSIS — Z0000 Encounter for general adult medical examination without abnormal findings: Secondary | ICD-10-CM

## 2024-01-15 DIAGNOSIS — G2581 Restless legs syndrome: Secondary | ICD-10-CM

## 2024-01-15 DIAGNOSIS — G4730 Sleep apnea, unspecified: Secondary | ICD-10-CM

## 2024-01-15 DIAGNOSIS — Z8249 Family history of ischemic heart disease and other diseases of the circulatory system: Secondary | ICD-10-CM

## 2024-01-15 DIAGNOSIS — F32A Depression, unspecified: Secondary | ICD-10-CM

## 2024-01-15 DIAGNOSIS — M5416 Radiculopathy, lumbar region: Secondary | ICD-10-CM

## 2024-01-15 DIAGNOSIS — R5382 Chronic fatigue, unspecified: Secondary | ICD-10-CM

## 2024-01-15 MED ORDER — ROPINIROLE HCL 1 MG PO TABS: 1.0000 mg | ORAL_TABLET | Freq: Every evening | ORAL | Status: DC | PRN

## 2024-01-15 NOTE — Patient Instructions (Signed)
 Go to the lab on the way out.   If you have mychart we'll likely use that to update you.    Take care.  Glad to see you.

## 2024-01-15 NOTE — Progress Notes (Signed)
 CPE- See plan.  Routine anticipatory guidance given to patient.  See health maintenance.  The possibility exists that previously documented standard health maintenance information may have been brought forward from a previous encounter into this note.  If needed, that same information has been updated to reflect the current situation based on today's encounter.    covid vaccine previously done Tetanus 2013, can be done in the future.   Flu done at work PNA prev done. Shingles 2022 Colonoscopy 2024 PSA 2025 Diet and exercise d/w pt.   Advance directive d/w pt.  Bother and sister in law designated if patient were incapacitated.   He gave blood with [employer] cross since surgery.   Recheck CBC pending.  Followed by GI. Still on mesalamine .  Sx controlled.    CPAP per outside clinic.    H/o RLS noted.  Taking 4mg  requip  with extra 1-2 mg per day.  Sx improved with returning to work.    He is back to work part time after surgery.  He had surgery.  His leg pain is better.  He is R foot slapping with walking, meaning he still has relatively weak foot dorsiflexion.  That is not yet better.    He saw podiatry in the meantime. Mobic  vs ibuprofen  d/w pt.  Cautions d/w pt about not taking both. Injection helped with foot pain.    Still on citalopram  at baseline.  No ADE on med.  It helped his mood.  Compliant.    He has some fatigue, unclear if that is from RLS vs other.  He was taking caffeine daily.  This is a longstanding issue, ie with fatigue.    PMH and SH reviewed  Meds, vitals, and allergies reviewed.   ROS: Per HPI.  Unless specifically indicated otherwise in HPI, the patient denies:  General: fever. Eyes: acute vision changes ENT: sore throat Cardiovascular: chest pain Respiratory: SOB GI: vomiting GU: dysuria Musculoskeletal: acute back pain Derm: acute rash Neuro: acute motor dysfunction Psych: worsening mood Endocrine: polydipsia Heme: bleeding Allergy: hayfever  GEN: nad,  alert and oriented HEENT: ncat NECK: supple w/o LA CV: rrr. PULM: ctab, no inc wob ABD: soft, +bs EXT: no edema SKIN: no acute rash, healed midline lower back scar He has weaker R foot dorsiflexion now.

## 2024-01-16 LAB — COMPREHENSIVE METABOLIC PANEL WITH GFR
ALT: 29 U/L (ref 0–53)
AST: 28 U/L (ref 0–37)
Albumin: 4.7 g/dL (ref 3.5–5.2)
Alkaline Phosphatase: 113 U/L (ref 39–117)
BUN: 13 mg/dL (ref 6–23)
CO2: 29 meq/L (ref 19–32)
Calcium: 9.2 mg/dL (ref 8.4–10.5)
Chloride: 100 meq/L (ref 96–112)
Creatinine, Ser: 0.94 mg/dL (ref 0.40–1.50)
GFR: 88.31 mL/min (ref >60.00)
Glucose, Bld: 82 mg/dL (ref 70–99)
Potassium: 4 meq/L (ref 3.5–5.1)
Sodium: 140 meq/L (ref 135–145)
Total Bilirubin: 0.7 mg/dL (ref 0.2–1.2)
Total Protein: 7.1 g/dL (ref 6.0–8.3)

## 2024-01-16 LAB — CBC WITH DIFFERENTIAL/PLATELET
Basophils Absolute: 0 10*3/uL (ref 0.0–0.1)
Basophils Relative: 0.8 % (ref 0.0–3.0)
Eosinophils Absolute: 0.2 10*3/uL (ref 0.0–0.7)
Eosinophils Relative: 3.6 % (ref 0.0–5.0)
HCT: 44.9 % (ref 39.0–52.0)
Hemoglobin: 15.2 g/dL (ref 13.0–17.0)
Lymphocytes Relative: 19.4 % (ref 12.0–46.0)
Lymphs Abs: 1.2 10*3/uL (ref 0.7–4.0)
MCHC: 33.9 g/dL (ref 30.0–36.0)
MCV: 86.5 fl (ref 78.0–100.0)
Monocytes Absolute: 0.5 10*3/uL (ref 0.1–1.0)
Monocytes Relative: 7.5 % (ref 3.0–12.0)
Neutro Abs: 4.3 10*3/uL (ref 1.4–7.7)
Neutrophils Relative %: 68.7 % (ref 43.0–77.0)
Platelets: 237 10*3/uL (ref 150.0–400.0)
RBC: 5.19 Mil/uL (ref 4.22–5.81)
RDW: 13.5 % (ref 11.5–15.5)
WBC: 6.3 10*3/uL (ref 4.0–10.5)

## 2024-01-16 LAB — LIPID PANEL
Cholesterol: 175 mg/dL (ref 0–200)
HDL: 30.5 mg/dL — ABNORMAL LOW (ref >39.00)
LDL Cholesterol: 92 mg/dL (ref 0–99)
NonHDL: 144.73
Total CHOL/HDL Ratio: 6
Triglycerides: 263 mg/dL — ABNORMAL HIGH (ref 0.0–149.0)
VLDL: 52.6 mg/dL — ABNORMAL HIGH (ref 0.0–40.0)

## 2024-01-16 LAB — TSH: TSH: 1.58 u[IU]/mL (ref 0.35–5.50)

## 2024-01-16 LAB — VITAMIN D 25 HYDROXY (VIT D DEFICIENCY, FRACTURES): VITD: 46.63 ng/mL (ref 30.00–100.00)

## 2024-01-16 LAB — FERRITIN: Ferritin: 42.2 ng/mL (ref 22.0–322.0)

## 2024-01-16 LAB — PSA: PSA: 0.25 ng/mL (ref 0.10–4.00)

## 2024-01-18 ENCOUNTER — Ambulatory Visit: Payer: Self-pay | Admitting: Family Medicine

## 2024-01-18 NOTE — Assessment & Plan Note (Signed)
 His leg pain is better but he does not yet have full/normal dorsiflexion.  Cautions discussed with patient.

## 2024-01-18 NOTE — Assessment & Plan Note (Signed)
Advance directive d/w pt.  Bother and sister in law designated if patient were incapacitated.   

## 2024-01-18 NOTE — Assessment & Plan Note (Signed)
 Followed by GI. Still on mesalamine .  Sx controlled.   I will defer.  He agrees.

## 2024-01-18 NOTE — Assessment & Plan Note (Signed)
Continue citalopram as is.

## 2024-01-18 NOTE — Assessment & Plan Note (Signed)
 covid vaccine previously done Tetanus 2013, can be done in the future.   Flu done at work PNA prev done. Shingles 2022 Colonoscopy 2024 PSA 2025 Diet and exercise d/w pt.   Advance directive d/w pt.  Bother and sister in law designated if patient were incapacitated.

## 2024-01-18 NOTE — Assessment & Plan Note (Addendum)
 Per outside clinic.  Continue CPAP.  He was asking about getting a smaller device and I think it makes sense to check with the DME supplier to see what is available.

## 2024-01-18 NOTE — Assessment & Plan Note (Signed)
 Taking 4mg  requip  with extra 1-2 mg per day.  Sx improved with returning to work.   See notes on labs.  No change in Requip  at this point.

## 2024-01-18 NOTE — Assessment & Plan Note (Signed)
 We talked about checking his labs to look for reversible causes initially.  We could consider potentially checking testosterone  level later on.

## 2024-01-22 ENCOUNTER — Other Ambulatory Visit (HOSPITAL_COMMUNITY): Payer: Self-pay

## 2024-01-22 ENCOUNTER — Other Ambulatory Visit: Payer: Self-pay

## 2024-01-23 ENCOUNTER — Other Ambulatory Visit: Payer: Self-pay

## 2024-01-26 ENCOUNTER — Other Ambulatory Visit (HOSPITAL_COMMUNITY): Payer: Self-pay

## 2024-01-27 ENCOUNTER — Ambulatory Visit: Admitting: Podiatry

## 2024-01-28 ENCOUNTER — Other Ambulatory Visit (HOSPITAL_COMMUNITY): Payer: Self-pay

## 2024-01-28 MED ORDER — MESALAMINE 1.2 G PO TBEC
1.2000 g | DELAYED_RELEASE_TABLET | Freq: Every day | ORAL | 2 refills | Status: AC
  Filled 2024-01-28: qty 180, 90d supply, fill #0
  Filled 2024-05-11 – 2024-05-21 (×2): qty 180, 90d supply, fill #1
  Filled 2024-08-15: qty 180, 90d supply, fill #2

## 2024-01-29 ENCOUNTER — Other Ambulatory Visit (HOSPITAL_COMMUNITY): Payer: Self-pay

## 2024-02-10 ENCOUNTER — Ambulatory Visit (INDEPENDENT_AMBULATORY_CARE_PROVIDER_SITE_OTHER): Admitting: Podiatry

## 2024-02-10 DIAGNOSIS — M7751 Other enthesopathy of right foot: Secondary | ICD-10-CM

## 2024-02-10 DIAGNOSIS — M205X1 Other deformities of toe(s) (acquired), right foot: Secondary | ICD-10-CM | POA: Diagnosis not present

## 2024-02-10 DIAGNOSIS — M21379 Foot drop, unspecified foot: Secondary | ICD-10-CM | POA: Insufficient documentation

## 2024-02-10 NOTE — Progress Notes (Signed)
 Subjective:  Patient ID: Patrick Oconnor, male    DOB: 1966/02/14,  MRN: [id number]  Chief Complaint  Patient presents with   Foot Pain    Right foot capsulitis follow up  Pt stated that he is doing much better     58 y.o. male presents with the above complaint.  Patient presents for follow-up of right hallux limitus.  He states is doing better injection helped considerably.  He is here for another round.   Review of Systems: Negative except as noted in the HPI. Denies N/V/F/Ch.  Past Medical History:  Diagnosis Date   Anemia    Asthma, exercise induced    Degenerative disc disease, lumbar    Depression    Environmental allergies    GERD (gastroesophageal reflux disease)    History of bronchitis    Hypertension    IBS (irritable bowel syndrome)    Pneumonia    hx of   PONV (postoperative nausea and vomiting)    Sleep apnea 04/2007   Started CPAP; wears CPAP nightly   Tinnitus aurium, left    Ulcerative colitis (HCC)     Current Outpatient Medications:    acetaminophen  (TYLENOL ) 500 MG tablet, Take 1,000 mg by mouth every 6 (six) hours as needed for moderate pain. (Patient not taking: Reported on 01/15/2024), Disp: , Rfl:    albuterol  (VENTOLIN  HFA) 108 (90 Base) MCG/ACT inhaler, Inhale 1-2 puffs into the lungs every 6 (six) hours as needed for wheezing (prior to exercise.)., Disp: 8.5 g, Rfl: 2   bisacodyl (DULCOLAX) 5 MG EC tablet, 5 mg daily as needed for moderate constipation., Disp: , Rfl:    Cholecalciferol (VITAMIN D ) 125 MCG (5000 UT) CAPS, Take 5,000 Units by mouth daily., Disp: , Rfl:    citalopram  (CELEXA ) 20 MG tablet, Take 1 tablet (20 mg total) by mouth daily., Disp: 90 tablet, Rfl: 3   ferrous sulfate  325 (65 FE) MG tablet, Take 1 tablet (325 mg total) by mouth daily., Disp: , Rfl:    fluticasone  (FLONASE ) 50 MCG/ACT nasal spray, Place 1 spray into both nostrils daily., Disp: 16 g, Rfl: 0   ibuprofen  (ADVIL ) 800 MG tablet, Take 1 tablet (800 mg total) by  mouth 3 (three) times daily., Disp: 90 tablet, Rfl: 1   loratadine  (CLARITIN ) 10 MG tablet, Take 10 mg by mouth daily., Disp: , Rfl:    meloxicam  (MOBIC ) 15 MG tablet, Take 1 tablet (15 mg total) by mouth daily. (Patient not taking: Reported on 01/15/2024), Disp: 30 tablet, Rfl: 0   mesalamine  (LIALDA ) 1.2 g EC tablet, Take 2 tablets (2.4 g total) by mouth daily with a meal., Disp: 180 tablet, Rfl: 0   mesalamine  (LIALDA ) 1.2 g EC tablet, Take 2 tablets by mouth once daily with meal as directed., Disp: 180 tablet, Rfl: 2   ondansetron  (ZOFRAN ) 4 MG tablet, Take 1 tablet (4 mg total) by mouth every 8 (eight) hours as needed for nausea or vomiting., Disp: 10 tablet, Rfl: 0   oxyCODONE -acetaminophen  (PERCOCET/ROXICET) 5-325 MG tablet, Take 1 tablet by mouth every 4 (four) hours as needed. (Patient not taking: Reported on 01/15/2024), Disp: 40 tablet, Rfl: 0   pantoprazole  (PROTONIX ) 20 MG tablet, Take 1 tablet (20 mg total) by mouth daily., Disp: 90 tablet, Rfl: 3   polyethylene glycol powder (GLYCOLAX /MIRALAX ) powder, Take 17 g by mouth 2 (two) times daily as needed (For constipation.)., Disp: , Rfl:    Probiotic Product (PROBIOTIC DAILY PO), Take 1 capsule by mouth daily  as needed (For digestive health.). , Disp: , Rfl:    rOPINIRole  (REQUIP  XL) 4 MG 24 hr tablet, Take 1 tablet (4 mg total) by mouth at bedtime., Disp: 90 tablet, Rfl: 1   rOPINIRole  (REQUIP ) 1 MG tablet, Take 1-2 tablets (1-2 mg total) by mouth at bedtime as needed., Disp: , Rfl:    sennosides-docusate sodium  (SENOKOT-S) 8.6-50 MG tablet, Take 1-2 tablets by mouth daily as needed for constipation., Disp: , Rfl:    Tetrahydroz-Dextran-PEG-Povid (VISINE ADVANCED RELIEF OP), Apply 1-2 drops to eye as needed (For eye irritation.). , Disp: , Rfl:   Social History   Tobacco Use  Smoking Status Never   Passive exposure: Never  Smokeless Tobacco Never    Allergies  Allergen Reactions   Sulfamethoxazole -Trimethoprim  Nausea Only and Other  (See Comments)    Heads and joint pain  Other Reaction(s): feels like flu, arthralgia (joint pain)   Objective:  There were no vitals filed for this visit. There is no height or weight on file to calculate BMI. Constitutional Well developed. Well nourished.  Vascular Dorsalis pedis pulses palpable bilaterally. Posterior tibial pulses palpable bilaterally. Capillary refill normal to all digits.  No cyanosis or clubbing noted. Pedal hair growth normal.  Neurologic Normal speech. Oriented to person, place, and time. Epicritic sensation to light touch grossly present bilaterally.  Dermatologic Nails well groomed and normal in appearance. No open wounds. No skin lesions.  Orthopedic: Pain to palpation of right first metatarsophalangeal joint pain with range of motion of the joint.  Limited range of motion noted hallux limitus clinically appreciated.  Some intra-articular pain noted.   Radiographs: None Assessment:   1. Capsulitis of metatarsophalangeal (MTP) joint of right foot   2. Hallux limitus, right     Plan:  Patient was evaluated and treated and all questions answered.  Right first metatarsophalangeal joint capsulitis with underlying hallux limitus - All questions and concerns were discussed with the patient. - I discussed shoe gear modification extensive detail. - Given the amount of pain that he is having he will benefit from a steroid injection to help decrease acute inflammatory component associate with pain.  Patient agrees with plan to proceed with steroid injection -A second steroid injection was performed at right first MTP using 1% plain Lidocaine  and 10 mg of Kenalog. This was well tolerated. - Shoe gear modification discussed - Patient has orthotics from the past he will continue to wear them    No follow-ups on file.

## 2024-03-02 ENCOUNTER — Encounter: Payer: Self-pay | Admitting: Neurology

## 2024-03-02 DIAGNOSIS — G4733 Obstructive sleep apnea (adult) (pediatric): Secondary | ICD-10-CM

## 2024-03-02 DIAGNOSIS — Z9989 Dependence on other enabling machines and devices: Secondary | ICD-10-CM

## 2024-03-11 ENCOUNTER — Other Ambulatory Visit (HOSPITAL_COMMUNITY): Payer: Self-pay

## 2024-03-11 ENCOUNTER — Other Ambulatory Visit: Payer: Self-pay | Admitting: Family Medicine

## 2024-03-11 ENCOUNTER — Other Ambulatory Visit: Payer: Self-pay

## 2024-03-11 DIAGNOSIS — M5416 Radiculopathy, lumbar region: Secondary | ICD-10-CM | POA: Diagnosis not present

## 2024-03-11 MED ORDER — METHYLPREDNISOLONE 4 MG PO TBPK
ORAL_TABLET | ORAL | 0 refills | Status: AC
  Filled 2024-03-11 – 2024-03-16 (×2): qty 21, 6d supply, fill #0

## 2024-03-12 ENCOUNTER — Other Ambulatory Visit (HOSPITAL_COMMUNITY): Payer: Self-pay

## 2024-03-12 MED ORDER — ROPINIROLE HCL 1 MG PO TABS
1.0000 mg | ORAL_TABLET | Freq: Every evening | ORAL | 1 refills | Status: DC | PRN
  Filled 2024-03-12: qty 40, 20d supply, fill #0
  Filled 2024-03-12: qty 140, 70d supply, fill #0

## 2024-03-16 ENCOUNTER — Other Ambulatory Visit (HOSPITAL_COMMUNITY): Payer: Self-pay

## 2024-03-16 ENCOUNTER — Other Ambulatory Visit: Payer: Self-pay

## 2024-04-05 DIAGNOSIS — M5459 Other low back pain: Secondary | ICD-10-CM | POA: Diagnosis not present

## 2024-04-05 DIAGNOSIS — M5416 Radiculopathy, lumbar region: Secondary | ICD-10-CM | POA: Diagnosis not present

## 2024-04-05 DIAGNOSIS — R262 Difficulty in walking, not elsewhere classified: Secondary | ICD-10-CM | POA: Diagnosis not present

## 2024-04-06 ENCOUNTER — Encounter: Payer: Self-pay | Admitting: Neurology

## 2024-04-06 ENCOUNTER — Other Ambulatory Visit: Payer: Self-pay

## 2024-04-06 ENCOUNTER — Ambulatory Visit (INDEPENDENT_AMBULATORY_CARE_PROVIDER_SITE_OTHER): Admitting: Neurology

## 2024-04-06 VITALS — BP 144/96 | HR 54 | Ht 73.0 in | Wt 246.0 lb

## 2024-04-06 DIAGNOSIS — G4734 Idiopathic sleep related nonobstructive alveolar hypoventilation: Secondary | ICD-10-CM

## 2024-04-06 DIAGNOSIS — Z9989 Dependence on other enabling machines and devices: Secondary | ICD-10-CM | POA: Diagnosis not present

## 2024-04-06 DIAGNOSIS — G4733 Obstructive sleep apnea (adult) (pediatric): Secondary | ICD-10-CM | POA: Diagnosis not present

## 2024-04-06 MED ORDER — ROPINIROLE HCL 1 MG PO TABS
2.0000 mg | ORAL_TABLET | Freq: Every evening | ORAL | 1 refills | Status: DC | PRN
  Filled 2024-04-06: qty 180, 90d supply, fill #0

## 2024-04-06 MED ORDER — BACLOFEN 10 MG PO TABS
10.0000 mg | ORAL_TABLET | Freq: Every day | ORAL | 0 refills | Status: AC
  Filled 2024-04-06: qty 30, 30d supply, fill #0

## 2024-04-06 MED ORDER — ROTIGOTINE 4 MG/24HR TD PT24
1.0000 | MEDICATED_PATCH | Freq: Every day | TRANSDERMAL | 12 refills | Status: DC
  Filled 2024-04-06: qty 30, 30d supply, fill #0

## 2024-04-06 NOTE — Addendum Note (Signed)
 Addended by: DELFINO AUGUSTIN BROCKS on: 04/06/2024 03:53 PM   Modules accepted: Orders

## 2024-04-06 NOTE — Patient Instructions (Signed)
 Rotigotine  Patches What is this medication? ROTIGOTINE  (roe TIG [place] teen) treats the symptoms of Parkinson disease. It works by acting like dopamine, a substance in your body that helps manage movements and coordination. This reduces the symptoms of Parkinson, such as body stiffness and tremors. It may also be used to treat restless legs syndrome (RLS). This medicine may be used for other purposes; ask your health care provider or pharmacist if you have questions. COMMON BRAND NAME(S): Neupro  What should I tell my care team before I take this medication? They need to know if you have any of these conditions: Heart disease High blood pressure Low blood pressure Kidney disease Mental health condition Narcolepsy Sleep apnea An unusual or allergic reaction to rotigotine , sulfites, other medications, foods, dyes, or preservatives Pregnant or trying to get pregnant Breast-feeding How should I use this medication? This medication is for external use only. Use it as directed on the prescription label. Change the patch each day at the same time. Always remove the old patch before you apply a new one. Wash hands after removing and applying this medication. Keep using it unless your care team tells you to stop. This medication comes with INSTRUCTIONS FOR USE. Ask your pharmacist for directions on how to use this medication. Read the information carefully. Talk to your pharmacist or care team if you have questions. Talk to your care team about the use of this medication in children. Special care may be needed. Overdosage: If you think you have taken too much of this medicine contact a poison control center or emergency room at once. NOTE: This medicine is only for you. Do not share this medicine with others. What if I miss a dose? If you miss a dose, take it as soon as you can. If it is almost time for your next dose, take only that dose. Do not take double or extra doses. What may interact with this  medication? Alcohol Antihistamines for allergy, cough, and cold Certain medications for sleep Medications for depression, anxiety, or mental health conditions Metoclopramide  Opioid medications for pain This list may not describe all possible interactions. Give your health care provider a list of all the medicines, herbs, non-prescription drugs, or dietary supplements you use. Also tell them if you smoke, drink alcohol, or use illegal drugs. Some items may interact with your medicine. What should I watch for while using this medication? Visit your care team for regular checks on your progress. Tell your care team if your symptoms do not start to get better or if they get worse. Do not suddenly stop taking this medication. You may develop a severe reaction. Your care team will tell you how much medication to take. If your care team wants you to stop the medication, the dose may be slowly lowered over time to avoid any side effects. This medication may affect your coordination, reaction time, or judgement. Do not drive or operate machinery until you know how this medication affects you. Sit up or stand slowly to reduce the risk of dizzy or fainting spells. Drinking alcohol with this medication can increase the risk of these side effects. When taking this medication, you may fall asleep without notice. You may be doing activities like driving a car, talking, or eating. You may not feel drowsy before it happens. Contact your care team right away if this happens to you. There have been reports of increased sexual urges or other strong urges, such as gambling while taking this medication. If you experience  any of these while taking this medication, you should report this to your care team as soon as possible. This patch is sensitive to certain body heat changes. If your skin gets too hot, more medication will come out of the patch and can cause a deadly overdose. Call your care team if you get a fever. Do not  take hot baths. Do not sunbathe. Do not use hot tubs, saunas, hairdryers, heating pads, electric blankets, heated waterbeds, or tanning lamps. Do not do exercise that increases your body temperature. If you are going to have a magnetic resonance imaging (MRI) procedure, tell your MRI technician if you have this patch on your body. It must be removed before a MRI. What side effects may I notice from receiving this medication? Side effects that you should report to your care team as soon as possible: Allergic reactions--skin rash, itching, hives, swelling of the face, lips, tongue, or throat Falling asleep during daily activities Fast or irregular heartbeat Hallucinations Increase in blood pressure Irritation at application site Low blood pressure--dizziness, feeling faint or lightheaded, blurry vision Mood swings, irritability, hostility New or worsening uncontrolled and repetitive movements of the face, mouth, or upper body Swelling of the ankles, hands, or feet Urges to engage in impulsive behaviors such as gambling, binge eating, sexual activity, or shopping in ways that are unusual for you Side effects that usually do not require medical attention (report to your care team if they continue or are bothersome): Confusion Dizziness Drowsiness Headache Excessive sweating Nausea Trouble sleeping Vomiting This list may not describe all possible side effects. Call your doctor for medical advice about side effects. You may report side effects to FDA at 1-800-FDA-1088. Where should I keep my medication? Keep out of the reach of children and pets. Store at room temperature between 20 and 25 degrees C (68 and 77 degrees F). Store in original pouch until just before use. Get rid of any unused medication after the expiration date. To get rid of medications that are no longer needed or have expired: Take the medication to a medication take-back program. Check with your pharmacy or law enforcement  to find a location. If you cannot return the medication, ask your pharmacist or care team how to get rid of this medication safely. NOTE: This sheet is a summary. It may not cover all possible information. If you have questions about this medicine, talk to your doctor, pharmacist, or health care provider.  2024 [employer] Standard (2021-12-19 00:00:00)

## 2024-04-06 NOTE — Progress Notes (Signed)
 Provider:  Dedra Gores, MD  Primary Care Physician:  Cleatus Arlyss RAMAN, MD [address] [place] [address]     Referring Provider: Cleatus Arlyss RAMAN, Md [address] [place],  [place] [address]          Chief Complaint according to patient   Patient presents with:          States that overall having problems with RLS. He has been taking the Requip  4 mg XL in the morning and then has to take usually 2 tab in the evening. He has tried to take it anywhere from 6-9 pm and has not found that it works either way. He states haven't tried anything else. He states that he stopped using the CPAP due to the machine being aggravated. He doesn't notice any difference in decrease in RLS when using the machine vs not. DME [employer]       HISTORY OF PRESENT ILLNESS:  Patrick Oconnor is a 58 y.o. male patient who is here for revisit 04/06/2024 for  OSA, has still a neck size of 18 and Mallampati 3, BMI is 32.5  He had undergone back  surgery March 12 6th and after wards had some trouble in recovering from pain, had quit CPAP but is now back on it. Good apnea resolution, AHI 5.9/h no centrals,  But he has a second problem: RLS and got not better with the back surgery.  XR 4 mg  Requip  no longer controls the  movements.     Chief concern according to patient :   severe RLS   but back on CPAP, see below.       Fam Hx : see previous note  Social HX; see previous note       Review of Systems: Out of a complete 14 system review, the patient complains of only the following symptoms, and all other reviewed systems are negative.:     I am the worst sleeper and my legs are jumping every afternoon/ best sleep usually  in AM>  Lately waking up between 4-6 AM,  CPAP has no influence on those wake hors but waking up with CPAP is giving more restorative quality.   I read at night- and then my restless body,  restless arms are taking over.   Foot drop on the right, can't lift the  foot at the heel. SABRA   SLEEPINESS ?  How likely are you to doze in the following situations: 0 = not likely, 1 = slight chance, 2 = moderate chance, 3 = high chance  Sitting and Reading? Watching Television? Sitting inactive in a public place (theater or meeting)? Lying down in the afternoon when circumstances permit? Sitting and talking to someone? Sitting quietly after lunch without alcohol? In a car, while stopped for a few minutes in traffic? As a passenger in a car for an hour without a break?  Total = 6/ 24  FSS: 44.63        Social History   Socioeconomic History   Marital status: Single    Spouse name: Not on file   Number of children: Not on file   Years of education: 70   Highest education level: Not on file  Occupational History   Occupation: physical therapist    Employer: [employer]  Tobacco Use   Smoking status: Never    Passive exposure: Never   Smokeless tobacco: Never  Vaping Use  Vaping status: Never Used  Substance and Sexual Activity   Alcohol use: Yes    Alcohol/week: 0.3 standard drinks of alcohol    Comment: rarely   Drug use: No   Sexual activity: Not Currently  Other Topics Concern   Not on file  Social History Narrative   Native of [place], [place]   PT at [facility]   single   Social Drivers of Health   Financial Resource Strain: Not on file  Food Insecurity: Not on file  Transportation Needs: Not on file  Physical Activity: Not on file  Stress: Not on file  Social Connections: Not on file    Family History  Problem Relation Age of Onset   Cancer Father 47       prostate Stage 4   Prostate cancer Father    Cancer Maternal Grandmother        breast   Cancer Mother    Cancer Paternal Grandmother        Colon   Colon cancer Paternal Grandmother     Past Medical History:  Diagnosis Date   Anemia    Asthma, exercise induced    Degenerative disc disease, lumbar    Depression    Environmental allergies    GERD  (gastroesophageal reflux disease)    History of bronchitis    Hypertension    IBS (irritable bowel syndrome)    Pneumonia    hx of   PONV (postoperative nausea and vomiting)    Sleep apnea 04/2007   Started CPAP; wears CPAP nightly   Tinnitus aurium, left    Ulcerative colitis [facility])     Past Surgical History:  Procedure Laterality Date   ACROMIO-CLAVICULAR JOINT REPAIR Right 04/15/2013   Procedure: RIGHT SHOULDER ACROMIO-CLAVICULAR RECONSTRUCTION WITH ALLOGRAFT   ;  Surgeon: Franky CHRISTELLA Pointer, MD;  Location: [facility] OR;  Service: Orthopedics;  Laterality: Right;   ACROMIO-CLAVICULAR JOINT REPAIR Right 06/03/2013   Procedure: ACROMIO-CLAVICULAR JOINT REPAIR;  Surgeon: Franky CHRISTELLA Pointer, MD;  Location: [facility] OR;  Service: Orthopedics;  Laterality: Right;   ACROMIO-CLAVICULAR JOINT REPAIR Right 10/19/2015   Procedure: OPEN RIGHT SHOULDER ACROMIO-CLAVICULAR JOINT RECONSTRUCTION ;  Surgeon: Franky Pointer, MD;  Location: [facility] OR;  Service: Orthopedics;  Laterality: Right;   BACK SURGERY     10/2023   COLONOSCOPY  08/12/2005   multiple   CYST EXCISION  08/03/2022   Left perineal area   ESOPHAGOGASTRODUODENOSCOPY  10/11/2007   normal; multiple   ESOPHAGOGASTRODUODENOSCOPY (EGD) WITH PROPOFOL  N/A 01/22/2016   Procedure: ESOPHAGOGASTRODUODENOSCOPY (EGD) WITH PROPOFOL ;  Surgeon: Lynwood Bohr, MD;  Location: [facility];  Service: Endoscopy;  Laterality: N/A;   INCISION AND DRAINAGE Right 06/03/2013   Procedure: INCISION AND DRAINAGE RIGHT SHOULDER;  Surgeon: Franky CHRISTELLA Pointer, MD;  Location: [facility] OR;  Service: Orthopedics;  Laterality: Right;   INCISION AND DRAINAGE ABSCESS N/A 07/25/2022   Procedure: INCISION AND DRAINAGE OF SCROTAL ABSCESS;  Surgeon: Twylla Glendia BROCKS, MD;  Location: [facility] ORS;  Service: Urology;  Laterality: N/A;   pilonidal cystectomy  42003   x 2   PYLOROPLASTY  infant   pyloric stenosis   SEPTOPLASTY     SHOULDER ARTHROSCOPY WITH ROTATOR CUFF REPAIR Left 10/31/2022   Procedure: Left  shoulder arthroscopy, debridement, subacromial decompression, distal clavicle resection, rotator cuff repair;  Surgeon: Pointer Franky, MD;  Location: [facility] OR[facility];  Service: Orthopedics;  Laterality: Left;    SHOULDER OPEN ROTATOR CUFF REPAIR Right 10/19/2015   Procedure: OPEN RIGHT SHOULDER ROTATOR CUFF REPAIR ;  Surgeon: Franky Pointer, MD;  Location: [facility] OR;  Service: Orthopedics;  Laterality: Right;   thrombosed external hemorrhoid  04/13/2007   incised   TONSILLECTOMY AND ADENOIDECTOMY       Current Outpatient Medications on File Prior to Visit  Medication Sig Dispense Refill   acetaminophen  (TYLENOL ) 500 MG tablet Take 1,000 mg by mouth every 6 (six) hours as needed for moderate pain. (Patient not taking: Reported on 01/15/2024)     albuterol  (VENTOLIN  HFA) 108 (90 Base) MCG/ACT inhaler Inhale 1-2 puffs into the lungs every 6 (six) hours as needed for wheezing (prior to exercise.). 8.5 g 2   bisacodyl (DULCOLAX) 5 MG EC tablet 5 mg daily as needed for moderate constipation.     Cholecalciferol (VITAMIN D ) 125 MCG (5000 UT) CAPS Take 5,000 Units by mouth daily.     citalopram  (CELEXA ) 20 MG tablet Take 1 tablet (20 mg total) by mouth daily. 90 tablet 3   ferrous sulfate  325 (65 FE) MG tablet Take 1 tablet (325 mg total) by mouth daily.     fluticasone  (FLONASE ) 50 MCG/ACT nasal spray Place 1 spray into both nostrils daily. 16 g 0   ibuprofen  (ADVIL ) 800 MG tablet Take 1 tablet (800 mg total) by mouth 3 (three) times daily. 90 tablet 1   loratadine  (CLARITIN ) 10 MG tablet Take 10 mg by mouth daily.     meloxicam  (MOBIC ) 15 MG tablet Take 1 tablet (15 mg total) by mouth daily. (Patient not taking: Reported on 01/15/2024) 30 tablet 0   mesalamine  (LIALDA ) 1.2 g EC tablet Take 2 tablets by mouth once daily with meal as directed. 180 tablet 2   ondansetron  (ZOFRAN ) 4 MG tablet Take 1 tablet (4 mg total) by mouth every 8 (eight) hours as needed for nausea or vomiting. 10 tablet 0    oxyCODONE -acetaminophen  (PERCOCET/ROXICET) 5-325 MG tablet Take 1 tablet by mouth every 4 (four) hours as needed. (Patient not taking: Reported on 01/15/2024) 40 tablet 0   pantoprazole  (PROTONIX ) 20 MG tablet Take 1 tablet (20 mg total) by mouth daily. 90 tablet 3   polyethylene glycol powder (GLYCOLAX /MIRALAX ) powder Take 17 g by mouth 2 (two) times daily as needed (For constipation.).     Probiotic Product (PROBIOTIC DAILY PO) Take 1 capsule by mouth daily as needed (For digestive health.).      rOPINIRole  (REQUIP  XL) 4 MG 24 hr tablet Take 1 tablet (4 mg total) by mouth at bedtime. 90 tablet 1   rOPINIRole  (REQUIP ) 1 MG tablet Take 1-2 tablets (1-2 mg total) by mouth at bedtime as needed.     rOPINIRole  (REQUIP ) 1 MG tablet Take 1-2 tablets (1-2 mg total) by mouth at bedtime as needed. 180 tablet 1   sennosides-docusate sodium  (SENOKOT-S) 8.6-50 MG tablet Take 1-2 tablets by mouth daily as needed for constipation.     Tetrahydroz-Dextran-PEG-Povid (VISINE ADVANCED RELIEF OP) Apply 1-2 drops to eye as needed (For eye irritation.).      No current facility-administered medications on file prior to visit.    Allergies  Allergen Reactions   Sulfamethoxazole -Trimethoprim  Nausea Only and Other (See Comments)    Heads and joint pain  Other Reaction(s): feels like flu, arthralgia (joint pain)     DIAGNOSTIC DATA (LABS, IMAGING, TESTING) - I reviewed patient records, labs, notes, testing and imaging myself where available.  Lab Results  Component Value Date   WBC 6.3 01/15/2024   HGB 15.2 01/15/2024   HCT 44.9 01/15/2024   MCV 86.5 01/15/2024  PLT 237.0 01/15/2024      Component Value Date/Time   NA 140 01/15/2024 1459   NA 142 01/20/2023 1454   NA 133 (L) 09/24/2013 1616   K 4.0 01/15/2024 1459   K 3.5 09/24/2013 1616   CL 100 01/15/2024 1459   CL 106 09/24/2013 1616   CO2 29 01/15/2024 1459   CO2 26 09/24/2013 1616   GLUCOSE 82 01/15/2024 1459   GLUCOSE 111 (H) 09/24/2013  1616   BUN 13 01/15/2024 1459   BUN 14 01/20/2023 1454   BUN 27 (H) 09/24/2013 1616   CREATININE 0.94 01/15/2024 1459   CREATININE 0.76 06/28/2016 1204   CALCIUM 9.2 01/15/2024 1459   CALCIUM 8.9 09/24/2013 1616   PROT 7.1 01/15/2024 1459   PROT 7.3 01/20/2023 1454   PROT 8.3 (H) 09/24/2013 1616   ALBUMIN 4.7 01/15/2024 1459   ALBUMIN 4.7 01/20/2023 1454   ALBUMIN 4.4 09/24/2013 1616   AST 28 01/15/2024 1459   AST 34 09/24/2013 1616   ALT 29 01/15/2024 1459   ALT 40 09/24/2013 1616   ALKPHOS 113 01/15/2024 1459   ALKPHOS 116 09/24/2013 1616   BILITOT 0.7 01/15/2024 1459   BILITOT 0.4 01/20/2023 1454   BILITOT 0.8 09/24/2013 1616   GFRNONAA >60 11/05/2022 1211   GFRNONAA >89 06/28/2016 1204   GFRAA >89 06/28/2016 1204   Lab Results  Component Value Date   CHOL 175 01/15/2024   HDL 30.50 (L) 01/15/2024   LDLCALC 92 01/15/2024   LDLDIRECT 94.0 10/26/2019   TRIG 263.0 (H) 01/15/2024   CHOLHDL 6 01/15/2024   No results found for: HGBA1C Lab Results  Component Value Date   VITAMINB12 591 08/01/2020   Lab Results  Component Value Date   TSH 1.58 01/15/2024    PHYSICAL EXAM:  Vitals:   04/06/24 1448 04/06/24 1504  BP: (!) 160/91 (!) 144/96  Pulse: (!) 54    No data found. Body mass index is 32.46 kg/m.   Wt Readings from Last 3 Encounters:  04/06/24 246 lb (111.6 kg)  01/15/24 248 lb 12.8 oz (112.9 kg)  11/25/23 251 lb 8 oz (114.1 kg)     Ht Readings from Last 3 Encounters:  04/06/24 6' 1 (1.854 m)  01/15/24 6' 1.23 (1.86 m)  11/25/23 6' 1.5 (1.867 m)      General: The patient is awake, alert and appears not in acute distress and groomed. Head: Normocephalic, atraumatic.  Neck is supple. Mallampati 3,  neck circumference:18 inches .   Nasal airflow is patent.   Overbite /Retrognathia is mild  .  Dental status: biological  Cardiovascular:  Regular rate and cardiac rhythm by pulse, without distended neck veins. Respiratory: no shortness of  breath  Skin:  Without evidence of ankle edema, or rash. Trunk: BMI is 32.5     NEUROLOGIC EXAM: The patient is awake and alert, oriented to place and time.   Memory subjective described as intact.  Attention span & concentration ability appears normal.   Speech is fluent,  without  dysarthria, dysphonia or aphasia.  Mood and affect are appropriate.   Neurological Examination: Mental Status: Intact. Language and speech are normal. No cognitive deficits. Cranial Nerves II-XII: Intact. PERL. EOMI. VFF. No nystagmus.  No facial droop.  No ptosis.  Hearing is grossly intact bilaterally.  The tongue is normal and midline.   Motor: Strengths are 5/5 throughout the upper extremities but foot flap on the right.   Muscle bulk and tone are normal. No  tremors.  Coordination: No ataxia or dysmetria.  Sensory: Grossly intact throughout to all modalities. Reflexes: Normal and symmetric throughout.  No ankle clonus. Babinski's sign is absent bilaterally.  Hoffman's sign is absent bilaterally. Gait and Station: Normal. Romberg's sign is absent.   ASSESSMENT AND PLAN :   57 y.o. year old male  here with:    1) severe RLS, worse after back surgery,.  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  He takes his extended release medication at about 8 AM alongside other medication and he has a short acting form that he we will take between 7 and 9 PM.  New is that he actually wakes up in the middle of the night but has not insomnia our summer between 3 and 4 AM and he has resumed starting to walk his dog just to be in motion suppress the sensation of restlessness but he also knows that he is not easily going back to sleep.   He usually got his best sleep in the morning hours and this is frustrating to him.    2) OSA on CPAP, numerically good results but he feels not much better.    3 He was diagnosed with spinal stenosis. He has had foot drop and this remained after he had  undergone a laminectomy L3-4-5 and no discectomy.   His iron levels postsurgery were not significantly reduced, he still has oral iron pills available.I reviewed Dr Joshua' Notes from 03-11-2024. After surgery he developed severe pain.   4) cervical spine degenerative disease : IMPRESSION:  1. Multilevel cervical spondylosis with resultant mild to moderate diffuse spinal stenosis at C4-5 through C6-7. No frank cord impingement. 2. Multifactorial degenerative changes with resultant multilevel foraminal narrowing. Notable findings include moderate left C3 foraminal stenosis, with moderate to severe bilateral C4 through C8 foraminal narrowing as above.     PLAN:   My plan is to do a total look at the patient's restless body.  #1 I will order another ferritin and total iron binding capacity, ( 01-15-2024) Ferritin 42 ,  I would look at how long ago he had the last time TSH  ( 1. 58 in June) and T4 levels drawn,   I also would like to look at CK CK-MB, my intent is to look with a nerve conduction / EMG document again, not to reorder. .    Depending on what we find we may have want to use baclofen  at night and a dopaminergic agonist in daytime with extended release.    It may also be possible to use a patch which can provide higher doses of dopaminergic agonist.   I ordered a trial of baclofen  10 mg at bedtime po an switched to 4 mg daily rotigonine patch , daily.  May need to go to 6 mg q 24 hours.     I would like to thank Cleatus Arlyss RAMAN, MD and Cleatus Arlyss RAMAN, Md [address] [place],  [place] [address] for allowing me to meet with this pleasant patient.   Sleep Clinic Patients are generally offered input on sleep hygiene, life style changes and how to improve compliance with medical treatment where applicable. Review and reiteration of good sleep hygiene measures is offered to any sleep  clinic patient, be it in the first consultation or with any follow up visits.    Any patient with  sleepiness should be cautioned not to drive, work at heights, or operate dangerous or heavy equipment when feeling tired or sleepy.      The patient will be seen in follow-up in the sleep clinic at [facility] for discussion of test results, sleep related symptoms and treatment compliance review, further management strategies, etc.   The referring provider will be notified of the test results.   The patient's condition requires frequent monitoring and adjustments in the treatment plan, reflecting the ongoing complexity of care.  This provider is the continuing focal point for all needed services for this condition.  After spending a total time of  45  minutes face to face and time for  history taking, physical and neurologic examination, review of laboratory studies,  personal review of imaging studies, reports and results of other testing and review of referral information / records as far as provided in visit,   Electronically signed by: Dedra Gores, MD 04/06/2024 3:13 PM  [facility] and [facility] Sleep Board certified by The [employer] of Sleep Medicine and Diplomate of the [employer] of Sleep Medicine. Board certified In Neurology through the ABPN, Fellow of the [employer] of Neu[employer].

## 2024-04-07 ENCOUNTER — Other Ambulatory Visit: Payer: Self-pay

## 2024-04-07 ENCOUNTER — Ambulatory Visit: Payer: Self-pay | Admitting: Neurology

## 2024-04-07 DIAGNOSIS — R262 Difficulty in walking, not elsewhere classified: Secondary | ICD-10-CM | POA: Diagnosis not present

## 2024-04-07 DIAGNOSIS — M5416 Radiculopathy, lumbar region: Secondary | ICD-10-CM | POA: Diagnosis not present

## 2024-04-07 DIAGNOSIS — M5459 Other low back pain: Secondary | ICD-10-CM | POA: Diagnosis not present

## 2024-04-07 LAB — VITAMIN B12: Vitamin B-12: 830 pg/mL (ref 232–1245)

## 2024-04-07 LAB — IRON,TIBC AND FERRITIN PANEL
Ferritin: 40 ng/mL (ref 30–400)
Iron Saturation: 36 % (ref 15–55)
Iron: 134 ug/dL (ref 38–169)
Total Iron Binding Capacity: 374 ug/dL (ref 250–450)
UIBC: 240 ug/dL (ref 111–343)

## 2024-04-07 LAB — CK TOTAL AND CKMB (NOT AT ARMC)
CK-MB Index: 3.6 ng/mL (ref 0.0–10.4)
Total CK: 181 U/L (ref 41–331)

## 2024-04-08 ENCOUNTER — Other Ambulatory Visit: Payer: Self-pay

## 2024-04-14 ENCOUNTER — Encounter: Payer: Self-pay | Admitting: Neurology

## 2024-04-14 DIAGNOSIS — R262 Difficulty in walking, not elsewhere classified: Secondary | ICD-10-CM | POA: Diagnosis not present

## 2024-04-14 DIAGNOSIS — M5416 Radiculopathy, lumbar region: Secondary | ICD-10-CM | POA: Diagnosis not present

## 2024-04-14 DIAGNOSIS — M5459 Other low back pain: Secondary | ICD-10-CM | POA: Diagnosis not present

## 2024-04-14 MED ORDER — ROPINIROLE HCL 1 MG PO TABS: 1.0000 mg | ORAL_TABLET | Freq: Every evening | ORAL | Status: DC | PRN

## 2024-04-20 ENCOUNTER — Telehealth: Payer: Self-pay | Admitting: Neurology

## 2024-04-20 ENCOUNTER — Encounter: Payer: Self-pay | Admitting: Neurology

## 2024-04-20 ENCOUNTER — Other Ambulatory Visit (HOSPITAL_COMMUNITY): Payer: Self-pay

## 2024-04-20 MED ORDER — ROPINIROLE HCL ER 4 MG PO TB24
4.0000 mg | ORAL_TABLET | Freq: Every day | ORAL | 5 refills | Status: AC
  Filled 2024-04-20 – 2024-06-25 (×3): qty 30, 30d supply, fill #0
  Filled 2024-07-23: qty 90, 90d supply, fill #1
  Filled 2024-07-27: qty 30, 30d supply, fill #1
  Filled 2024-08-15 – 2024-08-28 (×2): qty 30, 30d supply, fill #2

## 2024-04-20 MED ORDER — ROPINIROLE HCL 1 MG PO TABS
1.0000 mg | ORAL_TABLET | Freq: Four times a day (QID) | ORAL | 4 refills | Status: AC | PRN
  Filled 2024-04-20 – 2024-07-23 (×2): qty 120, 30d supply, fill #0
  Filled 2024-07-23: qty 360, 90d supply, fill #0
  Filled 2024-08-31: qty 120, 30d supply, fill #1

## 2024-04-21 ENCOUNTER — Other Ambulatory Visit: Payer: Self-pay | Admitting: Neurology

## 2024-04-21 DIAGNOSIS — M5416 Radiculopathy, lumbar region: Secondary | ICD-10-CM | POA: Diagnosis not present

## 2024-04-21 DIAGNOSIS — M5459 Other low back pain: Secondary | ICD-10-CM | POA: Diagnosis not present

## 2024-04-21 DIAGNOSIS — R262 Difficulty in walking, not elsewhere classified: Secondary | ICD-10-CM | POA: Diagnosis not present

## 2024-05-03 DIAGNOSIS — M5416 Radiculopathy, lumbar region: Secondary | ICD-10-CM | POA: Diagnosis not present

## 2024-05-03 DIAGNOSIS — M5459 Other low back pain: Secondary | ICD-10-CM | POA: Diagnosis not present

## 2024-05-03 DIAGNOSIS — R262 Difficulty in walking, not elsewhere classified: Secondary | ICD-10-CM | POA: Diagnosis not present

## 2024-05-05 DIAGNOSIS — R262 Difficulty in walking, not elsewhere classified: Secondary | ICD-10-CM | POA: Diagnosis not present

## 2024-05-05 DIAGNOSIS — M5416 Radiculopathy, lumbar region: Secondary | ICD-10-CM | POA: Diagnosis not present

## 2024-05-05 DIAGNOSIS — M5459 Other low back pain: Secondary | ICD-10-CM | POA: Diagnosis not present

## 2024-05-11 ENCOUNTER — Other Ambulatory Visit: Payer: Self-pay

## 2024-05-12 DIAGNOSIS — R262 Difficulty in walking, not elsewhere classified: Secondary | ICD-10-CM | POA: Diagnosis not present

## 2024-05-12 DIAGNOSIS — M5459 Other low back pain: Secondary | ICD-10-CM | POA: Diagnosis not present

## 2024-05-12 DIAGNOSIS — M5416 Radiculopathy, lumbar region: Secondary | ICD-10-CM | POA: Diagnosis not present

## 2024-05-21 ENCOUNTER — Other Ambulatory Visit: Payer: Self-pay

## 2024-05-21 DIAGNOSIS — M5416 Radiculopathy, lumbar region: Secondary | ICD-10-CM | POA: Diagnosis not present

## 2024-05-21 DIAGNOSIS — M5459 Other low back pain: Secondary | ICD-10-CM | POA: Diagnosis not present

## 2024-05-21 DIAGNOSIS — R262 Difficulty in walking, not elsewhere classified: Secondary | ICD-10-CM | POA: Diagnosis not present

## 2024-05-22 ENCOUNTER — Other Ambulatory Visit: Payer: Self-pay

## 2024-05-24 NOTE — Telephone Encounter (Signed)
 Refilled dopaminergic agonist medication,  requip .

## 2024-06-25 ENCOUNTER — Other Ambulatory Visit (HOSPITAL_COMMUNITY): Payer: Self-pay

## 2024-06-25 ENCOUNTER — Other Ambulatory Visit: Payer: Self-pay

## 2024-07-23 ENCOUNTER — Other Ambulatory Visit: Payer: Self-pay

## 2024-07-23 ENCOUNTER — Other Ambulatory Visit (HOSPITAL_COMMUNITY): Payer: Self-pay

## 2024-07-24 ENCOUNTER — Other Ambulatory Visit: Payer: Self-pay

## 2024-07-26 ENCOUNTER — Other Ambulatory Visit (HOSPITAL_COMMUNITY): Payer: Self-pay

## 2024-07-27 ENCOUNTER — Other Ambulatory Visit: Payer: Self-pay

## 2024-07-29 ENCOUNTER — Ambulatory Visit: Admitting: Neurology

## 2024-08-15 ENCOUNTER — Other Ambulatory Visit: Payer: Self-pay | Admitting: Podiatry

## 2024-08-15 ENCOUNTER — Other Ambulatory Visit: Payer: Self-pay

## 2024-08-16 ENCOUNTER — Other Ambulatory Visit: Payer: Self-pay

## 2024-08-17 ENCOUNTER — Other Ambulatory Visit: Payer: Self-pay

## 2024-08-28 ENCOUNTER — Other Ambulatory Visit: Payer: Self-pay

## 2024-08-31 ENCOUNTER — Other Ambulatory Visit: Payer: Self-pay

## 2024-09-03 ENCOUNTER — Encounter: Payer: Self-pay | Admitting: Neurology

## 2024-09-08 ENCOUNTER — Ambulatory Visit: Admitting: Neurology

## 2024-09-08 ENCOUNTER — Other Ambulatory Visit: Payer: Self-pay

## 2024-09-08 ENCOUNTER — Encounter: Payer: Self-pay | Admitting: Neurology

## 2024-09-08 VITALS — BP 144/81 | HR 61 | Ht 73.0 in | Wt 250.0 lb

## 2024-09-08 DIAGNOSIS — G63 Polyneuropathy in diseases classified elsewhere: Secondary | ICD-10-CM | POA: Diagnosis not present

## 2024-09-08 DIAGNOSIS — Z77011 Contact with and (suspected) exposure to lead: Secondary | ICD-10-CM | POA: Insufficient documentation

## 2024-09-08 DIAGNOSIS — K529 Noninfective gastroenteritis and colitis, unspecified: Secondary | ICD-10-CM | POA: Diagnosis not present

## 2024-09-08 MED ORDER — PREGABALIN 25 MG PO CAPS
ORAL_CAPSULE | ORAL | 2 refills | Status: AC
  Filled 2024-09-08: qty 180, 60d supply, fill #0

## 2024-09-08 MED ORDER — PREDNISONE 10 MG PO TABS
ORAL_TABLET | ORAL | 0 refills | Status: AC
  Filled 2024-09-08: qty 50, 23d supply, fill #0

## 2024-09-08 NOTE — Progress Notes (Signed)
 "        Provider:  Dedra Gores, MD  Primary Care Physician:  Cleatus Arlyss RAMAN, MD [address] [place] [address]     Referring Provider: Cleatus Arlyss RAMAN, Md [address] [place],  [place] [address]          Chief Complaint according to patient   Patient presents with:                HISTORY OF PRESENT ILLNESS:  Patrick Oconnor is a 59 y.o. male patient who is here on 09/08/2024 for  urgent  visit he requested, regarding his restless limbs. About 5 days a week he feels impaired at work and at rest.  Medication causes micro-sleep attacks, can't sleep when he would have a nap opportunity.  Medication affects his mood too.   I can't do a good job, I spent too much time on the computer as I drift off every 2 sentences.  Sleep apnea-seems unrelated to his urgent complaint.   High doses of Dopaminergic agonists.  Nupro patch not tolerated  baclofen  didn't work,   Ferritin was low but TIBC normal.  When he had low iron in the past his RLS exacerbated.   I want to check for lead , he has had  8 years of Lead exposure by step by step. restoring an older home that had lead paint.  Inflammatory bowel disease.  Another reason to check for neuropathy battery and start treatment of symptoms by Lyrica .   Lyrica   ( pregabalin ) to be started next for RLS-  this medication works longer than gabapentin .  Steroid dose pack to help  taper Ropinerol off,     Chief concern according to patient :  My father had a severe neuropathy and later developed cancer. I am just concerned about this affecting me.  Foot lift weakness. Has stumbled over the left foot, over the big toe.    OSA- severe - confirmed by HST and not a good candidate for Inspire  By AASM criteria the baseline AHI is 74.7/h which is a very severe degree of apnea.   Only 15.3% for considered central sleep apnea which I think is an understatement.  Nearly all apneas were associated with a desaturation of 4% or more,  520 total apneas were falling into this category. Plus:  total time in hypoxia < 90% saturation:  21.8 minutes.There is significant hypoxia present which makes an inspire device not the best treatment of choice.   If the patient would accept a partial treatment of apnea to be sufficient he could go ahead with a dental device or an inspire device.   I do think that this step would be at considerable risk for his health.                 I would order a new CPAP and forgo INSPIRE-  .         Review of Systems: Out of a complete 14 system review, the patient complains of only the following symptoms, and all other reviewed systems are negative.:   SLEEPINESS ?  How likely are you to doze in the following situations: 0 = not likely, 1 = slight chance, 2 = moderate chance, 3 = high chance        Social History   Socioeconomic History   Marital status: Single    Spouse name: Not on file   Number of children: Not on file  Years of education: 57   Highest education level: Not on file  Occupational History   Occupation: physical therapist    Employer: [employer]  Tobacco Use   Smoking status: Never    Passive exposure: Never   Smokeless tobacco: Never  Vaping Use   Vaping status: Never Used  Substance and Sexual Activity   Alcohol use: Yes    Alcohol/week: 0.3 standard drinks of alcohol    Comment: rarely   Drug use: No   Sexual activity: Not Currently  Other Topics Concern   Not on file  Social History Narrative   Native of [place], [place]   PT at [facility]   single   Social Drivers of Health   Tobacco Use: Low Risk (09/08/2024)   Patient History    Smoking Tobacco Use: Never    Smokeless Tobacco Use: Never    Passive Exposure: Never  Financial Resource Strain: Not on file  Food Insecurity: Not on file  Transportation Needs: Not on file  Physical Activity: Not on file  Stress: Not on file  Social Connections: Not on file  Depression (PHQ2-9): Low Risk (01/15/2024)    Depression (PHQ2-9)    PHQ-2 Score: 2  Alcohol Screen: Not on file  Housing: Not on file  Utilities: Not on file  Health Literacy: Not on file    Family History  Problem Relation Age of Onset   Cancer Father 64       prostate Stage 4   Prostate cancer Father    Cancer Maternal Grandmother        breast   Cancer Mother    Cancer Paternal Grandmother        Colon   Colon cancer Paternal Grandmother     Past Medical History:  Diagnosis Date   Anemia    Asthma, exercise induced    Degenerative disc disease, lumbar    Depression    Environmental allergies    GERD (gastroesophageal reflux disease)    History of bronchitis    Hypertension    IBS (irritable bowel syndrome)    Pneumonia    hx of   PONV (postoperative nausea and vomiting)    Sleep apnea 04/2007   Started CPAP; wears CPAP nightly   Tinnitus aurium, left    Ulcerative colitis [facility])     Past Surgical History:  Procedure Laterality Date   ACROMIO-CLAVICULAR JOINT REPAIR Right 04/15/2013   Procedure: RIGHT SHOULDER ACROMIO-CLAVICULAR RECONSTRUCTION WITH ALLOGRAFT   ;  Surgeon: Franky CHRISTELLA Pointer, MD;  Location: [facility] OR;  Service: Orthopedics;  Laterality: Right;   ACROMIO-CLAVICULAR JOINT REPAIR Right 06/03/2013   Procedure: ACROMIO-CLAVICULAR JOINT REPAIR;  Surgeon: Franky CHRISTELLA Pointer, MD;  Location: [facility] OR;  Service: Orthopedics;  Laterality: Right;   ACROMIO-CLAVICULAR JOINT REPAIR Right 10/19/2015   Procedure: OPEN RIGHT SHOULDER ACROMIO-CLAVICULAR JOINT RECONSTRUCTION ;  Surgeon: Franky Pointer, MD;  Location: [facility] OR;  Service: Orthopedics;  Laterality: Right;   BACK SURGERY  11/07/2023   10/2023   COLONOSCOPY  08/12/2005   multiple   CYST EXCISION  08/03/2022   Left perineal area   ESOPHAGOGASTRODUODENOSCOPY  10/11/2007   normal; multiple   ESOPHAGOGASTRODUODENOSCOPY (EGD) WITH PROPOFOL  N/A 01/22/2016   Procedure: ESOPHAGOGASTRODUODENOSCOPY (EGD) WITH PROPOFOL ;  Surgeon: Lynwood Bohr, MD;  Location: [facility];   Service: Endoscopy;  Laterality: N/A;   INCISION AND DRAINAGE Right 06/03/2013   Procedure: INCISION AND DRAINAGE RIGHT SHOULDER;  Surgeon: Franky CHRISTELLA Pointer, MD;  Location: [facility];  Service: Orthopedics;  Laterality:  Right;   INCISION AND DRAINAGE ABSCESS N/A 07/25/2022   Procedure: INCISION AND DRAINAGE OF SCROTAL ABSCESS;  Surgeon: Twylla Glendia BROCKS, MD;  Location: [facility] ORS;  Service: Urology;  Laterality: N/A;   pilonidal cystectomy  42003   x 2   PYLOROPLASTY  infant   pyloric stenosis   SEPTOPLASTY     SHOULDER ARTHROSCOPY WITH ROTATOR CUFF REPAIR Left 10/31/2022   Procedure: Left shoulder arthroscopy, debridement, subacromial decompression, distal clavicle resection, rotator cuff repair;  Surgeon: Melita Drivers, MD;  Location: [facility];  Service: Orthopedics;  Laterality: Left;    SHOULDER OPEN ROTATOR CUFF REPAIR Right 10/19/2015   Procedure: OPEN RIGHT SHOULDER ROTATOR CUFF REPAIR ;  Surgeon: Drivers Melita, MD;  Location: [facility];  Service: Orthopedics;  Laterality: Right;   thrombosed external hemorrhoid  04/13/2007   incised   TONSILLECTOMY AND ADENOIDECTOMY       Medications Ordered Prior to Encounter[1]  Allergies[2]   DIAGNOSTIC DATA (LABS, IMAGING, TESTING) - I reviewed patient records, labs, notes, testing and imaging myself where available.  Lab Results  Component Value Date   WBC 6.3 01/15/2024   HGB 15.2 01/15/2024   HCT 44.9 01/15/2024   MCV 86.5 01/15/2024   PLT 237.0 01/15/2024      Component Value Date/Time   NA 140 01/15/2024 1459   NA 142 01/20/2023 1454   NA 133 (L) 09/24/2013 1616   K 4.0 01/15/2024 1459   K 3.5 09/24/2013 1616   CL 100 01/15/2024 1459   CL 106 09/24/2013 1616   CO2 29 01/15/2024 1459   CO2 26 09/24/2013 1616   GLUCOSE 82 01/15/2024 1459   GLUCOSE 111 (H) 09/24/2013 1616   BUN 13 01/15/2024 1459   BUN 14 01/20/2023 1454   BUN 27 (H) 09/24/2013 1616   CREATININE 0.94 01/15/2024 1459   CREATININE 0.76 06/28/2016 1204    CALCIUM 9.2 01/15/2024 1459   CALCIUM 8.9 09/24/2013 1616   PROT 7.1 01/15/2024 1459   PROT 7.3 01/20/2023 1454   PROT 8.3 (H) 09/24/2013 1616   ALBUMIN 4.7 01/15/2024 1459   ALBUMIN 4.7 01/20/2023 1454   ALBUMIN 4.4 09/24/2013 1616   AST 28 01/15/2024 1459   AST 34 09/24/2013 1616   ALT 29 01/15/2024 1459   ALT 40 09/24/2013 1616   ALKPHOS 113 01/15/2024 1459   ALKPHOS 116 09/24/2013 1616   BILITOT 0.7 01/15/2024 1459   BILITOT 0.4 01/20/2023 1454   BILITOT 0.8 09/24/2013 1616   GFRNONAA >60 11/05/2022 1211   GFRNONAA >89 06/28/2016 1204   GFRAA >89 06/28/2016 1204   Lab Results  Component Value Date   CHOL 175 01/15/2024   HDL 30.50 (L) 01/15/2024   LDLCALC 92 01/15/2024   LDLDIRECT 94.0 10/26/2019   TRIG 263.0 (H) 01/15/2024   CHOLHDL 6 01/15/2024   No results found for: HGBA1C Lab Results  Component Value Date   VITAMINB12 830 04/06/2024   Lab Results  Component Value Date   TSH 1.58 01/15/2024    PHYSICAL EXAM:  Vitals:   09/08/24 1306  BP: (!) 144/81  Pulse: 61   No data found. Body mass index is 32.98 kg/m.   Wt Readings from Last 3 Encounters:  09/08/24 250 lb (113.4 kg)  04/06/24 246 lb (111.6 kg)  01/15/24 248 lb 12.8 oz (112.9 kg)     Ht Readings from Last 3 Encounters:  09/08/24 6' 1 (1.854 m)  04/06/24 6' 1 (1.854 m)  01/15/24 6' 1.23 (1.86 m)  General: The patient is awake, alert and appears not in acute distress and groomed. Head: Normocephalic, atraumatic.  Neck is supple. Mallampati 3 plus,  neck circumference:19 inches . Small oral opening,. Nasal airflow is patent.  Retrognathia is not seen.  Dental status: biological, facial hair.  Cardiovascular:  Regular rate and cardiac rhythm by pulse,  without distended neck veins. Respiratory: Lungs are clear to auscultation.  Skin:  Without evidence of ankle edema, or rash. Trunk: The patient's posture is erect.   NEUROLOGIC EXAM: The patient is awake and alert, oriented to  place and time.   Memory subjective described as intact.  Attention span & concentration ability appears normal.  Speech is fluent,  without  dysarthria, dysphonia or aphasia.  Mood and affect are appropriate.   Cranial nerves: no loss of smell or taste reported  Pupils are equal and briskly reactive to light. Funduscopic exam deferred.  Extraocular movements in vertical and horizontal planes were intact and without nystagmus. No Diplopia. Visual fields by finger perimetry are intact. Hearing was intact to soft voice and finger rubbing.    Facial sensation intact to fine touch.  Facial motor strength is symmetric and tongue and uvula move midline.  Neck ROM : rotation, tilt and flexion extension were normal for age. The shoulder shrug was asymmetrical.  Left shoulder is higher.    Motor exam:  Symmetric bulk, tone and ROM.   Normal tone without cog wheeling, symmetric grip strength .   Sensory:  Fine touch, pinprick and vibration were tested  and  normal.  Proprioception tested in the upper extremities was normal.   Coordination: Rapid alternating movements in the fingers/hands were of normal speed.  The Finger-to-nose maneuver was intact without evidence of ataxia, dysmetria or tremor.   Gait and station: Patient could rise unassisted from a seated position, walked without assistive device.  Stance is of normal width/ base and the patient turned with 3 steps.  Toe and heel walk impaimred on the left Deep tendon reflexes: in the  upper and lower extremities are symmetric and brisk at lower extremities.  Babinski response was deferred      ASSESSMENT AND PLAN :   59 y.o. year old male  here with: Progressing side effects of RLS therapy on high dose dopaminergic medication,  up 8 mg ropinorol a day .  1)  Augmentation   2) Neuropathy component ? - panel ordered , labs ,   3) CPAP not addressed today.    PLAN: taper weekly by 1 mg  Ropinorol and assist the ascending symptoms  with Lyrica   po.   Steroid dose pack ordered for PRN use in case symptoms acutely worsen.   Lyrica  start at 25 mg bid  and reduce  Ropinorol  by 1 mg upon return from vacation- Try to reduce pm dose by 1 mg .   Call me in 4 weeks.   You got 180 tabs of lyrica  25 mg.   You got prednisone  10 mg dose pack.    I would like to thank Cleatus Arlyss RAMAN, MD and Cleatus Arlyss RAMAN, Md [address] [place],  [place] [address] for allowing me to meet with this pleasant patient.   Sleep Clinic Patients are generally offered input on sleep hygiene, life style changes and how to improve compliance with medical treatment where applicable. Review and reiteration of good sleep hygiene measures is offered to any sleep clinic patient, be it in the first consultation or with any follow  up visits.    Any patient with sleepiness should be cautioned not to drive, work at heights, or operate dangerous or heavy equipment when feeling tired or sleepy.      The patient will be seen in follow-up in the sleep clinic at [facility] for discussion of test results, sleep related symptoms and treatment compliance review, further management strategies, etc.   The referring provider will be notified of the test results.   The patient's condition requires frequent monitoring and adjustments in the treatment plan, reflecting the ongoing complexity of care.  This provider is the continuing focal point for all needed services for this condition.  After spending a total time of  45  minutes face to face and time for  history taking, physical and neurologic examination, review of laboratory studies,  personal review of imaging studies, reports and results of other testing and review of referral information / records as far as provided in visit,   Electronically signed by: Dedra Gores, MD 09/08/2024 1:17 PM  [facility] Board certified by The [employer] of Sleep Medicine and Diplomate of the  [employer]. Board certified In Neurology through the ABPN, Fellow of the [employer] of Neu[employer].      [1]  Current Outpatient Medications on File Prior to Visit  Medication Sig Dispense Refill   acetaminophen  (TYLENOL ) 500 MG tablet Take 1,000 mg by mouth every 6 (six) hours as needed for moderate pain.     albuterol  (VENTOLIN  HFA) 108 (90 Base) MCG/ACT inhaler Inhale 1-2 puffs into the lungs every 6 (six) hours as needed for wheezing (prior to exercise.). 8.5 g 2   bisacodyl (DULCOLAX) 5 MG EC tablet 5 mg daily as needed for moderate constipation.     Cholecalciferol (VITAMIN D ) 125 MCG (5000 UT) CAPS Take 5,000 Units by mouth daily.     citalopram  (CELEXA ) 20 MG tablet Take 1 tablet (20 mg total) by mouth daily. 90 tablet 3   ferrous sulfate  325 (65 FE) MG tablet Take 1 tablet (325 mg total) by mouth daily.     loratadine  (CLARITIN ) 10 MG tablet Take 10 mg by mouth daily.     mesalamine  (LIALDA ) 1.2 g EC tablet Take 2 tablets by mouth once daily with meal as directed. 180 tablet 2   ondansetron  (ZOFRAN ) 4 MG tablet Take 1 tablet (4 mg total) by mouth every 8 (eight) hours as needed for nausea or vomiting. 10 tablet 0   pantoprazole  (PROTONIX ) 20 MG tablet Take 1 tablet (20 mg total) by mouth daily. 90 tablet 3   Probiotic Product (PROBIOTIC DAILY PO) Take 1 capsule by mouth daily as needed (For digestive health.).      rOPINIRole  (REQUIP  XL) 4 MG 24 hr tablet Take 1 tablet (4 mg total) by mouth at bedtime. 30 tablet 5   rOPINIRole  (REQUIP ) 1 MG tablet Take 1 tablet (1 mg total) by mouth 4 (four) times daily as needed. 120 tablet 4   sennosides-docusate sodium  (SENOKOT-S) 8.6-50 MG tablet Take 1-2 tablets by mouth daily as needed for constipation.     Tetrahydroz-Dextran-PEG-Povid (VISINE ADVANCED RELIEF OP) Apply 1-2 drops to eye as needed (For eye irritation.).      baclofen  (LIORESAL ) 10 MG tablet Take 1 tablet (10 mg total) by mouth at bedtime. (Patient not  taking: Reported on 09/08/2024) 30 each 0   fluticasone  (FLONASE ) 50 MCG/ACT nasal spray Place 1 spray into both nostrils daily. (Patient not taking: Reported  on 09/08/2024) 16 g 0   ibuprofen  (ADVIL ) 800 MG tablet Take 1 tablet (800 mg total) by mouth 3 (three) times daily. (Patient not taking: Reported on 09/08/2024) 90 tablet 1   meloxicam  (MOBIC ) 15 MG tablet Take 1 tablet (15 mg total) by mouth daily. (Patient not taking: Reported on 09/08/2024) 30 tablet 0   polyethylene glycol powder (GLYCOLAX /MIRALAX ) powder Take 17 g by mouth 2 (two) times daily as needed (For constipation.). (Patient not taking: Reported on 09/08/2024)     No current facility-administered medications on file prior to visit.  [2]  Allergies Allergen Reactions   Sulfamethoxazole -Trimethoprim  Nausea Only and Other (See Comments)    Heads and joint pain  Other Reaction(s): feels like flu, arthralgia (joint pain)   "

## 2024-09-08 NOTE — Progress Notes (Signed)
 Patrick Oconnor

## 2024-09-09 ENCOUNTER — Other Ambulatory Visit: Payer: Self-pay

## 2024-09-10 ENCOUNTER — Other Ambulatory Visit: Payer: Self-pay

## 2024-09-10 DIAGNOSIS — Z0289 Encounter for other administrative examinations: Secondary | ICD-10-CM

## 2024-09-11 LAB — ANA W/REFLEX

## 2024-09-12 LAB — ENA+DNA/DS+ANTICH+CENTRO+FA...
Anti JO-1: <0.2 AI (ref 0.0–0.9)
Antiribosomal P Antibodies: <0.2 AI (ref 0.0–0.9)
Centromere Ab Screen: <0.2 AI (ref 0.0–0.9)
Chromatin Ab SerPl-aCnc: <0.2 AI (ref 0.0–0.9)
ENA RNP Ab: 0.4 AI (ref 0.0–0.9)
ENA SM Ab Ser-aCnc: <0.2 AI (ref 0.0–0.9)
ENA SSA (RO) Ab: <0.2 AI (ref 0.0–0.9)
ENA SSB (LA) Ab: <0.2 AI (ref 0.0–0.9)
Scleroderma (Scl-70) (ENA) Antibody, IgG: <0.2 AI (ref 0.0–0.9)
Smith/RNP Antibodies: <0.2 AI (ref 0.0–0.9)
Speckled Pattern: 1:80 {titer}
dsDNA Ab: <1 [IU]/mL (ref 0–9)

## 2024-09-12 LAB — ANA W/REFLEX: ANA Titer 1: POSITIVE — AB

## 2024-09-13 ENCOUNTER — Ambulatory Visit: Payer: Self-pay | Admitting: Neurology

## 2024-09-13 ENCOUNTER — Other Ambulatory Visit: Payer: Self-pay

## 2024-09-13 MED ORDER — AMOXICILLIN 500 MG PO CAPS
500.0000 mg | ORAL_CAPSULE | Freq: Three times a day (TID) | ORAL | 1 refills | Status: AC
  Filled 2024-09-13: qty 21, 7d supply, fill #0

## 2024-09-14 ENCOUNTER — Other Ambulatory Visit: Payer: Self-pay

## 2024-09-15 LAB — VITAMIN B6: Vitamin B6: 12.6 ug/L (ref 3.4–65.2)

## 2024-09-15 LAB — MULTIPLE MYELOMA PANEL, SERUM
Albumin SerPl Elph-Mcnc: 4.4 g/dL (ref 2.9–4.4)
Albumin/Glob SerPl: 1.5 (ref 0.7–1.7)
Alpha 1: 0.3 g/dL (ref 0.0–0.4)
Alpha2 Glob SerPl Elph-Mcnc: 0.8 g/dL (ref 0.4–1.0)
B-Globulin SerPl Elph-Mcnc: 1 g/dL (ref 0.7–1.3)
Gamma Glob SerPl Elph-Mcnc: 0.9 g/dL (ref 0.4–1.8)
Globulin, Total: 3 g/dL (ref 2.2–3.9)
IgG (Immunoglobin G), Serum: 953 mg/dL (ref 603–1613)
IgM (Immunoglobulin M), Srm: 57 mg/dL (ref 20–172)
Immunoglobulin A, (IgA) QN, Serum: 183 mg/dL (ref 90–386)

## 2024-09-15 LAB — COMPREHENSIVE METABOLIC PANEL WITH GFR
ALT: 36 [IU]/L (ref 0–44)
AST: 30 [IU]/L (ref 0–40)
Albumin: 5 g/dL — ABNORMAL HIGH (ref 3.8–4.9)
Alkaline Phosphatase: 116 [IU]/L (ref 47–123)
BUN/Creatinine Ratio: 17 (ref 10–24)
BUN: 19 mg/dL (ref 8–27)
Bilirubin Total: 0.4 mg/dL (ref 0.0–1.2)
CO2: 22 mmol/L (ref 20–29)
Calcium: 9.3 mg/dL (ref 8.6–10.2)
Chloride: 100 mmol/L (ref 96–106)
Creatinine, Ser: 1.11 mg/dL (ref 0.76–1.27)
Globulin, Total: 2.4 g/dL (ref 1.5–4.5)
Glucose: 71 mg/dL (ref 70–99)
Potassium: 4.2 mmol/L (ref 3.5–5.2)
Sodium: 143 mmol/L (ref 134–144)
Total Protein: 7.4 g/dL (ref 6.0–8.5)
eGFR: 76 mL/min/{1.73_m2}

## 2024-09-15 LAB — CBC WITH DIFFERENTIAL/PLATELET
Basophils Absolute: 0 10*3/uL (ref 0.0–0.2)
Basos: 1 %
EOS (ABSOLUTE): 0.3 10*3/uL (ref 0.0–0.4)
Eos: 5 %
Hematocrit: 46.7 % (ref 37.5–51.0)
Hemoglobin: 15.4 g/dL (ref 13.0–17.7)
Immature Grans (Abs): 0 10*3/uL (ref 0.0–0.1)
Immature Granulocytes: 0 %
Lymphocytes Absolute: 1.2 10*3/uL (ref 0.7–3.1)
Lymphs: 20 %
MCH: 28.8 pg (ref 26.6–33.0)
MCHC: 33 g/dL (ref 31.5–35.7)
MCV: 87 fL (ref 79–97)
Monocytes Absolute: 0.4 10*3/uL (ref 0.1–0.9)
Monocytes: 7 %
Neutrophils Absolute: 4 10*3/uL (ref 1.4–7.0)
Neutrophils: 67 %
Platelets: 186 10*3/uL (ref 150–450)
RBC: 5.35 x10E6/uL (ref 4.14–5.80)
RDW: 13.1 % (ref 11.6–15.4)
WBC: 5.9 10*3/uL (ref 3.4–10.8)

## 2024-09-15 LAB — ANCA PROFILE
Anti-MPO Antibodies: <0.2 U (ref 0.0–0.9)
Anti-PR3 Antibodies: <0.2 U (ref 0.0–0.9)
Atypical pANCA: <1:20 {titer}
C-ANCA: <1:20 {titer}
P-ANCA: <1:20 {titer}

## 2024-09-15 LAB — SJOGREN'S SYNDROME ANTIBODS(SSA + SSB)
ENA SSA (RO) Ab: <0.2 AI (ref 0.0–0.9)
ENA SSB (LA) Ab: <0.2 AI (ref 0.0–0.9)

## 2024-09-15 LAB — HEMOGLOBIN A1C
Est. average glucose Bld gHb Est-mCnc: 105 mg/dL
Hgb A1c MFr Bld: 5.3 % (ref 4.8–5.6)

## 2024-09-15 LAB — HEAVY METALS, BLOOD
Arsenic: <1 ug/L (ref 0–9)
Lead, Blood: <1 ug/dL (ref 0.0–3.4)
Mercury: <1 ug/L (ref 0.0–14.9)

## 2024-09-15 LAB — SEDIMENTATION RATE: Sed Rate: 19 mm/h (ref 0–30)

## 2024-09-15 LAB — HOMOCYSTEINE: Homocysteine: 12.2 umol/L (ref 0.0–14.5)

## 2024-09-15 LAB — C-REACTIVE PROTEIN: CRP: 5 mg/L (ref 0–10)

## 2024-09-15 LAB — TSH: TSH: 2 u[IU]/mL (ref 0.450–4.500)

## 2024-09-15 LAB — METHYLMALONIC ACID, SERUM: Methylmalonic Acid: 143 nmol/L (ref 0–378)

## 2024-09-30 ENCOUNTER — Ambulatory Visit: Admitting: Neurology

## 2025-01-17 ENCOUNTER — Encounter: Admitting: Family Medicine
